# Patient Record
Sex: Male | Born: 1935 | Race: White | Hispanic: No | Smoking: Never smoker
Health system: Southern US, Community
[De-identification: ages and names within clinical notes are randomized; demographics above are authoritative.]

## PROBLEM LIST (undated history)

## (undated) DIAGNOSIS — R112 Nausea with vomiting, unspecified: Secondary | ICD-10-CM

## (undated) DIAGNOSIS — J45909 Unspecified asthma, uncomplicated: Secondary | ICD-10-CM

## (undated) DIAGNOSIS — E119 Type 2 diabetes mellitus without complications: Secondary | ICD-10-CM

## (undated) DIAGNOSIS — I10 Essential (primary) hypertension: Secondary | ICD-10-CM

## (undated) DIAGNOSIS — C801 Malignant (primary) neoplasm, unspecified: Secondary | ICD-10-CM

## (undated) DIAGNOSIS — G4730 Sleep apnea, unspecified: Secondary | ICD-10-CM

## (undated) DIAGNOSIS — K219 Gastro-esophageal reflux disease without esophagitis: Secondary | ICD-10-CM

## (undated) DIAGNOSIS — D649 Anemia, unspecified: Secondary | ICD-10-CM

## (undated) DIAGNOSIS — J988 Other specified respiratory disorders: Secondary | ICD-10-CM

## (undated) DIAGNOSIS — Z9889 Other specified postprocedural states: Secondary | ICD-10-CM

## (undated) DIAGNOSIS — M71122 Other infective bursitis, left elbow: Secondary | ICD-10-CM

## (undated) HISTORY — PX: TOE SURGERY: SHX1073

## (undated) HISTORY — PX: TONSILLECTOMY: SUR1361

## (undated) HISTORY — DX: Type 2 diabetes mellitus without complications: E11.9

## (undated) HISTORY — PX: ELBOW SURGERY: SHX618

## (undated) HISTORY — PX: EYE SURGERY: SHX253

## (undated) HISTORY — DX: Malignant (primary) neoplasm, unspecified: C80.1

## (undated) HISTORY — DX: Other specified respiratory disorders: J98.8

## (undated) HISTORY — DX: Essential (primary) hypertension: I10

## (undated) HISTORY — PX: CATARACT EXTRACTION, BILATERAL: SHX1313

## (undated) HISTORY — DX: Other infective bursitis, left elbow: M71.122

## (undated) HISTORY — DX: Unspecified asthma, uncomplicated: J45.909

---

## 1998-03-09 HISTORY — PX: OTHER SURGICAL HISTORY: SHX169

## 2000-03-09 HISTORY — PX: PROSTATE SURGERY: SHX751

## 2011-10-29 DIAGNOSIS — H269 Unspecified cataract: Secondary | ICD-10-CM | POA: Insufficient documentation

## 2011-10-29 DIAGNOSIS — E1122 Type 2 diabetes mellitus with diabetic chronic kidney disease: Secondary | ICD-10-CM | POA: Insufficient documentation

## 2011-11-25 DIAGNOSIS — G4733 Obstructive sleep apnea (adult) (pediatric): Secondary | ICD-10-CM | POA: Insufficient documentation

## 2011-11-25 DIAGNOSIS — K219 Gastro-esophageal reflux disease without esophagitis: Secondary | ICD-10-CM | POA: Insufficient documentation

## 2011-11-25 DIAGNOSIS — E669 Obesity, unspecified: Secondary | ICD-10-CM | POA: Insufficient documentation

## 2011-11-25 DIAGNOSIS — E785 Hyperlipidemia, unspecified: Secondary | ICD-10-CM | POA: Insufficient documentation

## 2011-11-25 DIAGNOSIS — J45909 Unspecified asthma, uncomplicated: Secondary | ICD-10-CM | POA: Insufficient documentation

## 2011-11-25 DIAGNOSIS — D649 Anemia, unspecified: Secondary | ICD-10-CM | POA: Insufficient documentation

## 2011-11-25 DIAGNOSIS — J309 Allergic rhinitis, unspecified: Secondary | ICD-10-CM | POA: Insufficient documentation

## 2011-11-25 DIAGNOSIS — C61 Malignant neoplasm of prostate: Secondary | ICD-10-CM | POA: Insufficient documentation

## 2011-11-25 DIAGNOSIS — I10 Essential (primary) hypertension: Secondary | ICD-10-CM | POA: Insufficient documentation

## 2011-12-30 DIAGNOSIS — G2581 Restless legs syndrome: Secondary | ICD-10-CM | POA: Insufficient documentation

## 2011-12-31 DIAGNOSIS — Z9889 Other specified postprocedural states: Secondary | ICD-10-CM | POA: Insufficient documentation

## 2014-01-08 ENCOUNTER — Ambulatory Visit (INDEPENDENT_AMBULATORY_CARE_PROVIDER_SITE_OTHER): Payer: Medicare Other | Admitting: General Surgery

## 2014-01-08 ENCOUNTER — Encounter: Payer: Self-pay | Admitting: General Surgery

## 2014-01-08 VITALS — BP 110/80 | HR 78 | Resp 16 | Ht 66.0 in | Wt 240.0 lb

## 2014-01-08 DIAGNOSIS — D170 Benign lipomatous neoplasm of skin and subcutaneous tissue of head, face and neck: Secondary | ICD-10-CM

## 2014-01-08 NOTE — Patient Instructions (Signed)
Follow up appointment to be announced.  

## 2014-01-08 NOTE — Progress Notes (Signed)
Patient ID: Joshua Humphrey, male   DOB: 07-Sep-1929, 80 y.o.   MRN: [id number]  Chief Complaint  Patient presents with  . Other    evaluation of large lipoma on upper back    HPI Joshua Humphrey is a 80 y.o. male. here today for a evalaution of a lipoma on back. Patient states the area has been there for 15 years and had it removed in [facility]. Patient states within an year the area was back. He states the are has gotten bigger over the last couple years.                               HPI  Past Medical History  Diagnosis Date  . Asthma   . Hypertension   . Cancer     prostate  . Cancer     skin   . Diabetes mellitus without complication     Past Surgical History  Procedure Laterality Date  . Tumor removed  2000  . Prostate surgery  2002  . Lipoma removal  2000    back     Family History  Problem Relation Age of Onset  . Cancer Mother     Social History History  Substance Use Topics  . Smoking status: Never Smoker   . Smokeless tobacco: Never Used  . Alcohol Use: 0.0 oz/week    0 Not specified per week    No Known Allergies  Current Outpatient Prescriptions  Medication Sig Dispense Refill  . acetaminophen (TYLENOL) 500 MG tablet Take 500 mg by mouth every 6 (six) hours as needed.    . Ascorbic Acid (VITAMIN C) 1000 MG tablet Take 1,000 mg by mouth daily.    Marland Kitchen aspirin 81 MG tablet Take 81 mg by mouth daily.    . beta carotene w/minerals (OCUVITE) tablet Take 1 tablet by mouth daily.    . Calcium Carbonate-Vitamin D (CALTRATE 600+D PO) Take 2 tablets by mouth daily.    . carvedilol (COREG) 25 MG tablet Take 25 mg by mouth 2 (two) times daily with a meal.    . cetirizine (ZYRTEC) 10 MG chewable tablet Chew 10 mg by mouth daily.    Marland Kitchen ezetimibe-simvastatin (VYTORIN) 10-20 MG per tablet Take 1 tablet by mouth daily.    . ferrous fumarate (HEMOCYTE - 106 MG FE) 325 (106 FE) MG TABS tablet Take 1 tablet by mouth daily.    . fluticasone (VERAMYST) 27.5 MCG/SPRAY nasal  spray Place 2 sprays into the nose daily.    . furosemide (LASIX) 40 MG tablet Take 40 mg by mouth 2 (two) times daily.    Marland Kitchen gabapentin (NEURONTIN) 300 MG capsule Take 300 mg by mouth 3 (three) times daily.    . Garlic 0626 MG CAPS Take 1 capsule by mouth daily.    . Glucosamine-Chondroit-Vit C-Mn (GLUCOSAMINE-CHONDROITIN) CAPS Take 2 capsules by mouth daily.    . hydrALAZINE (APRESOLINE) 50 MG tablet Take 50 mg by mouth 3 (three) times daily.    . insulin detemir (LEVEMIR) 100 UNIT/ML injection Inject 20 Units into the skin at bedtime.    . Ipratropium-Albuterol (COMBIVENT RESPIMAT) 20-100 MCG/ACT AERS respimat Inhale 1 puff into the lungs every 6 (six) hours.    Marland Kitchen leuprolide (LUPRON) 22.5 MG injection Inject 22.5 mg into the muscle every 6 (six) months.    Marland Kitchen losartan (COZAAR) 100 MG tablet Take 100 mg by mouth daily.    Marland Kitchen  metFORMIN (GLUCOPHAGE) 850 MG tablet Take 850 mg by mouth 2 (two) times daily with a meal.    . mometasone-formoterol (DULERA) 100-5 MCG/ACT AERO Inhale 2 puffs into the lungs 2 (two) times daily.    . montelukast (SINGULAIR) 10 MG tablet Take 10 mg by mouth at bedtime.    . Multiple Vitamin (MULTIVITAMIN) tablet Take 1 tablet by mouth daily.    Marland Kitchen NIFEdipine (ADALAT CC) 90 MG 24 hr tablet Take 90 mg by mouth daily.    Marland Kitchen omeprazole (PRILOSEC) 20 MG capsule Take 20 mg by mouth daily.    . potassium chloride SA (K-DUR,KLOR-CON) 20 MEQ tablet Take 20 mEq by mouth 2 (two) times daily.    . pramipexole (MIRAPEX) 0.25 MG tablet Take 0.25 mg by mouth at bedtime as needed.    . sitaGLIPtin (JANUVIA) 100 MG tablet Take 100 mg by mouth daily.    Marland Kitchen tretinoin (RETIN-A) 0.1 % cream Apply 1 application topically at bedtime.    Marland Kitchen VITAMIN D, CHOLECALCIFEROL, PO Take 1 capsule by mouth daily.     No current facility-administered medications for this visit.    Review of Systems Review of Systems  Constitutional: Negative.   Respiratory: Negative.     Blood pressure 110/80, pulse 78,  resp. rate 16, height 5\' 6"  (1.676 m), weight 240 lb (108.863 kg).  Physical Exam Physical Exam  Constitutional: He is oriented to person, place, and time. He appears well-developed and well-nourished.  Neck:    Cardiovascular: Normal rate, regular rhythm and normal heart sounds.   Pulmonary/Chest: Effort normal and breath sounds normal.  Neurological: He is alert and oriented to person, place, and time.  Skin: Skin is warm and dry.  12 by 16 mass upper back    Data Reviewed None.  Assessment    Recurrent lipoma.     Plan    Discussed lipoma removal as an outpatient with sedation based on the size of the lesion and modest fixation to the deep fascia.  This can be completed with the patient in the left lateral decubitus position.    PCP:  Reggie Pile 01/09/2014, 9:38 PM

## 2014-01-09 DIAGNOSIS — D170 Benign lipomatous neoplasm of skin and subcutaneous tissue of head, face and neck: Secondary | ICD-10-CM | POA: Insufficient documentation

## 2015-07-04 ENCOUNTER — Other Ambulatory Visit: Payer: Self-pay | Admitting: Neurological Surgery

## 2015-07-04 DIAGNOSIS — M48061 Spinal stenosis, lumbar region without neurogenic claudication: Secondary | ICD-10-CM

## 2015-07-11 ENCOUNTER — Other Ambulatory Visit: Payer: Self-pay | Admitting: Neurological Surgery

## 2015-07-24 ENCOUNTER — Ambulatory Visit: Payer: Medicare Other

## 2015-07-30 ENCOUNTER — Ambulatory Visit
Admission: RE | Admit: 2015-07-30 | Discharge: 2015-07-30 | Disposition: A | Discharge status: Home / Self Care | Payer: Medicare Other | Source: Ambulatory Visit | Attending: Neurological Surgery | Admitting: Neurological Surgery

## 2015-07-30 DIAGNOSIS — M4806 Spinal stenosis, lumbar region: Secondary | ICD-10-CM | POA: Insufficient documentation

## 2015-07-30 DIAGNOSIS — M48061 Spinal stenosis, lumbar region without neurogenic claudication: Secondary | ICD-10-CM

## 2015-08-06 ENCOUNTER — Inpatient Hospital Stay: Admit: 2015-08-06 | Payer: Medicare Other | Admitting: Neurological Surgery

## 2015-08-06 SURGERY — POSTERIOR LUMBAR FUSION 1 LEVEL
Anesthesia: General | Site: Back

## 2015-10-22 DIAGNOSIS — M4807 Spinal stenosis, lumbosacral region: Secondary | ICD-10-CM | POA: Insufficient documentation

## 2015-10-22 DIAGNOSIS — M4120 Other idiopathic scoliosis, site unspecified: Secondary | ICD-10-CM | POA: Insufficient documentation

## 2015-11-07 DIAGNOSIS — M5416 Radiculopathy, lumbar region: Secondary | ICD-10-CM | POA: Diagnosis not present

## 2015-11-07 DIAGNOSIS — Z8546 Personal history of malignant neoplasm of prostate: Secondary | ICD-10-CM

## 2015-11-07 DIAGNOSIS — E1142 Type 2 diabetes mellitus with diabetic polyneuropathy: Secondary | ICD-10-CM

## 2015-11-07 DIAGNOSIS — J45909 Unspecified asthma, uncomplicated: Secondary | ICD-10-CM

## 2015-11-07 DIAGNOSIS — I10 Essential (primary) hypertension: Secondary | ICD-10-CM | POA: Diagnosis not present

## 2015-12-12 ENCOUNTER — Other Ambulatory Visit
Admission: RE | Admit: 2015-12-12 | Discharge: 2015-12-12 | Disposition: A | Discharge status: Home / Self Care | Payer: Medicare Other | Source: Ambulatory Visit | Attending: Family Medicine | Admitting: Family Medicine

## 2015-12-12 DIAGNOSIS — M71122 Other infective bursitis, left elbow: Secondary | ICD-10-CM | POA: Insufficient documentation

## 2015-12-12 LAB — SYNOVIAL CELL COUNT + DIFF, W/ CRYSTALS
CRYSTALS FLUID: NONE SEEN
EOSINOPHILS-SYNOVIAL: 0 %
LYMPHOCYTES-SYNOVIAL FLD: 7 %
MONOCYTE-MACROPHAGE-SYNOVIAL FLUID: 0 %
Neutrophil, Synovial: 93 %
Other Cells-SYN: 0
WBC, Synovial: 55341 /mm3 — ABNORMAL HIGH (ref 0–200)

## 2015-12-15 LAB — BODY FLUID CULTURE

## 2015-12-20 LAB — ANAEROBIC CULTURE

## 2016-01-09 ENCOUNTER — Encounter: Payer: Self-pay | Admitting: Urology

## 2016-01-09 ENCOUNTER — Ambulatory Visit (INDEPENDENT_AMBULATORY_CARE_PROVIDER_SITE_OTHER): Payer: Medicare Other | Admitting: Urology

## 2016-01-09 VITALS — BP 109/62 | HR 71 | Ht 69.0 in | Wt 213.8 lb

## 2016-01-09 DIAGNOSIS — Z8546 Personal history of malignant neoplasm of prostate: Secondary | ICD-10-CM

## 2016-01-09 NOTE — Progress Notes (Signed)
01/09/2016 3:57 PM   Joshua Humphrey [birth year] [id number]  Referring provider: Montey Hora, MD No address on file  Chief Complaint  Patient presents with  . New Patient (Initial Visit)    Prostate Cancer     HPI: The patient is an 82 year old gentleman with a past medical history of prostate cancer status post prostatectomy in 2001 who presents for further evaluation. His PSA was undetectable in October 2017. The patient is transferring his care from a urologist in [facility]. I do not have records at this time from the office. Per the patient he was getting Lupron 22.5 mg IM every 6 months. He does not think that he had metastatic prostate cancer. He has been on Lupron since his prostate was removed per the patient. It is not clear why he was on Lupron or if he had recurrence of disease.  The patient does note mild incontinence that does not bother him. He otherwise has no urinary complaints.   PMH: Past Medical History:  Diagnosis Date  . Asthma   . Cancer    prostate  . Cancer    skin   . Diabetes mellitus without complication   . Hypertension     Surgical History: Past Surgical History:  Procedure Laterality Date  . lipoma removal  2000   back   . PROSTATE SURGERY  2002  . tumor removed  2000    Home Medications:    Medication List       Accurate as of 01/09/16  3:57 PM. Always use your most recent med list.          acetaminophen 500 MG tablet Commonly known as:  TYLENOL Take 500 mg by mouth every 6 (six) hours as needed.   aspirin 81 MG tablet Take 81 mg by mouth daily.   beta carotene w/minerals tablet Take 1 tablet by mouth daily.   CALTRATE 600+D PO Take 2 tablets by mouth daily.   carvedilol 25 MG tablet Commonly known as:  COREG Take 25 mg by mouth 2 (two) times daily with a meal.   cetirizine 10 MG chewable tablet Commonly known as:  ZYRTEC Chew 10 mg by mouth daily.   COMBIVENT RESPIMAT 20-100 MCG/ACT Aers  respimat Generic drug:  Ipratropium-Albuterol Inhale 1 puff into the lungs every 6 (six) hours.   ezetimibe-simvastatin 10-20 MG tablet Commonly known as:  VYTORIN Take 1 tablet by mouth daily.   ferrous fumarate 325 (106 Fe) MG Tabs tablet Commonly known as:  HEMOCYTE - 106 mg FE Take 1 tablet by mouth daily.   fluticasone 27.5 MCG/SPRAY nasal spray Commonly known as:  VERAMYST Place 2 sprays into the nose daily.   furosemide 40 MG tablet Commonly known as:  LASIX Take 40 mg by mouth 2 (two) times daily.   gabapentin 300 MG capsule Commonly known as:  NEURONTIN Take 300 mg by mouth 3 (three) times daily.   Garlic [id number] MG Caps Take 1 capsule by mouth daily.   Glucosamine-Chondroitin Caps Take 2 capsules by mouth daily.   hydrALAZINE 50 MG tablet Commonly known as:  APRESOLINE Take 50 mg by mouth 3 (three) times daily.   insulin detemir 100 UNIT/ML injection Commonly known as:  LEVEMIR Inject 20 Units into the skin at bedtime.   leuprolide 22.5 MG injection Commonly known as:  LUPRON Inject 22.5 mg into the muscle every 6 (six) months.   losartan 100 MG tablet Commonly known as:  COZAAR Take 100 mg by mouth  daily.   metFORMIN 850 MG tablet Commonly known as:  GLUCOPHAGE Take 850 mg by mouth 2 (two) times daily with a meal.   mometasone-formoterol 100-5 MCG/ACT Aero Commonly known as:  DULERA Inhale 2 puffs into the lungs 2 (two) times daily.   montelukast 10 MG tablet Commonly known as:  SINGULAIR Take 10 mg by mouth at bedtime.   multivitamin tablet Take 1 tablet by mouth daily.   NIFEdipine 90 MG 24 hr tablet Commonly known as:  ADALAT CC Take 90 mg by mouth daily.   omeprazole 20 MG capsule Commonly known as:  PRILOSEC Take 20 mg by mouth daily.   potassium chloride SA 20 MEQ tablet Commonly known as:  K-DUR,KLOR-CON Take 20 mEq by mouth 2 (two) times daily.   pramipexole 0.25 MG tablet Commonly known as:  MIRAPEX Take 0.25 mg by mouth at  bedtime as needed.   sitaGLIPtin 100 MG tablet Commonly known as:  JANUVIA Take 100 mg by mouth daily.   tretinoin 0.1 % cream Commonly known as:  RETIN-A Apply 1 application topically at bedtime.   vitamin C 1000 MG tablet Take 1,000 mg by mouth daily.   VITAMIN D (CHOLECALCIFEROL) PO Take 1 capsule by mouth daily.       Allergies: No Known Allergies  Family History: Family History  Problem Relation Age of Onset  . Cancer Mother     Social History:  reports that he has never smoked. He has never used smokeless tobacco. He reports that he drinks alcohol. He reports that he does not use drugs.  ROS: UROLOGY Frequent Urination?: Yes Hard to postpone urination?: Yes Burning/pain with urination?: No Get up at night to urinate?: Yes Leakage of urine?: Yes Urine stream starts and stops?: No Trouble starting stream?: No Do you have to strain to urinate?: No Blood in urine?: No Urinary tract infection?: No Sexually transmitted disease?: No Injury to kidneys or bladder?: No Painful intercourse?: No Weak stream?: No Erection problems?: Yes Penile pain?: No  Gastrointestinal Nausea?: No Vomiting?: No Indigestion/heartburn?: No Diarrhea?: No Constipation?: No  Constitutional Fever: No Night sweats?: No Weight loss?: No Fatigue?: No  Skin Skin rash/lesions?: No Itching?: No  Eyes Blurred vision?: No Double vision?: No  Ears/Nose/Throat Sore throat?: No Sinus problems?: No  Hematologic/Lymphatic Swollen glands?: No Easy bruising?: No  Cardiovascular Leg swelling?: Yes Chest pain?: No  Respiratory Cough?: No Shortness of breath?: No  Endocrine Excessive thirst?: No  Musculoskeletal Back pain?: Yes Joint pain?: Yes  Neurological Headaches?: No Dizziness?: No  Psychologic Depression?: No Anxiety?: No  Physical Exam: There were no vitals taken for this visit.  Constitutional:  Alert and oriented, No acute distress. HEENT: [place] AT,  moist mucus membranes.  Trachea midline, no masses. Cardiovascular: No clubbing, cyanosis, or edema. Respiratory: Normal respiratory effort, no increased work of breathing. GI: Abdomen is soft, nontender, nondistended, no abdominal masses GU: No CVA tenderness.  Skin: No rashes, bruises or suspicious lesions. Lymph: No cervical or inguinal adenopathy. Neurologic: Grossly intact, no focal deficits, moving all 4 extremities. Psychiatric: Normal mood and affect.  Laboratory Data: No results found for: WBC, HGB, HCT, MCV, PLT  No results found for: CREATININE  No results found for: PSA  No results found for: TESTOSTERONE  No results found for: HGBA1C  Urinalysis No results found for: COLORURINE, APPEARANCEUR, LABSPEC, PHURINE, GLUCOSEU, HGBUR, BILIRUBINUR, KETONESUR, PROTEINUR, UROBILINOGEN, NITRITE, LEUKOCYTESUR   Assessment & Plan:   1. History of prostate cancer As I do not have  the patient's records from his previous urologist, it is difficult to assess what the status of his disease was or why he was on Lupron. Per the patient's personal medication list, he was receiving a Lupron dose every 6 months that only lasted 3 months. This is not typical, and I'm concerned by the accuracy. I'm also concerned that I do not have access at this time to these previous records. Fortunately, his PSA is currently undetectable. We will have him return in 3 months with a PSA prior. In the interim we will try to obtain his records from his previous urologists, so we know his complete previous urological history.  Nickie Retort, MD  [facility] [address] [place], [place] [address] [phone]

## 2016-01-16 ENCOUNTER — Inpatient Hospital Stay: Admission: RE | Admit: 2016-01-16 | Payer: Medicare Other | Source: Ambulatory Visit

## 2016-01-20 ENCOUNTER — Encounter
Admission: RE | Admit: 2016-01-20 | Discharge: 2016-01-20 | Disposition: A | Discharge status: Home / Self Care | Payer: Medicare Other | Source: Ambulatory Visit | Attending: Surgery | Admitting: Surgery

## 2016-01-20 DIAGNOSIS — E119 Type 2 diabetes mellitus without complications: Secondary | ICD-10-CM | POA: Diagnosis not present

## 2016-01-20 DIAGNOSIS — M7022 Olecranon bursitis, left elbow: Secondary | ICD-10-CM | POA: Diagnosis not present

## 2016-01-20 DIAGNOSIS — J449 Chronic obstructive pulmonary disease, unspecified: Secondary | ICD-10-CM | POA: Diagnosis not present

## 2016-01-20 DIAGNOSIS — I10 Essential (primary) hypertension: Secondary | ICD-10-CM | POA: Diagnosis not present

## 2016-01-20 DIAGNOSIS — G4730 Sleep apnea, unspecified: Secondary | ICD-10-CM | POA: Diagnosis not present

## 2016-01-20 DIAGNOSIS — K219 Gastro-esophageal reflux disease without esophagitis: Secondary | ICD-10-CM | POA: Diagnosis not present

## 2016-01-20 DIAGNOSIS — Z794 Long term (current) use of insulin: Secondary | ICD-10-CM | POA: Diagnosis not present

## 2016-01-20 DIAGNOSIS — Z7982 Long term (current) use of aspirin: Secondary | ICD-10-CM | POA: Diagnosis not present

## 2016-01-20 HISTORY — DX: Gastro-esophageal reflux disease without esophagitis: K21.9

## 2016-01-20 HISTORY — DX: Anemia, unspecified: D64.9

## 2016-01-20 HISTORY — DX: Sleep apnea, unspecified: G47.30

## 2016-01-20 HISTORY — DX: Other specified postprocedural states: Z98.890

## 2016-01-20 HISTORY — DX: Nausea with vomiting, unspecified: R11.2

## 2016-01-20 LAB — BASIC METABOLIC PANEL
ANION GAP: 8 (ref 5–15)
BUN: 27 mg/dL — ABNORMAL HIGH (ref 6–20)
CHLORIDE: 106 mmol/L (ref 101–111)
CO2: 27 mmol/L (ref 22–32)
CREATININE: 0.85 mg/dL (ref 0.61–1.24)
Calcium: 9.3 mg/dL (ref 8.9–10.3)
GFR calc non Af Amer: >60 mL/min (ref >60)
Glucose, Bld: 102 mg/dL — ABNORMAL HIGH (ref 65–99)
Potassium: 4.1 mmol/L (ref 3.5–5.1)
Sodium: 141 mmol/L (ref 135–145)

## 2016-01-20 LAB — CBC
HEMATOCRIT: 36.3 % — AB (ref 40.0–52.0)
HEMOGLOBIN: 12.1 g/dL — AB (ref 13.0–18.0)
MCH: 31.4 pg (ref 26.0–34.0)
MCHC: 33.3 g/dL (ref 32.0–36.0)
MCV: 94.2 fL (ref 80.0–100.0)
Platelets: 275 10*3/uL (ref 150–440)
RBC: 3.85 MIL/uL — ABNORMAL LOW (ref 4.40–5.90)
RDW: 15.1 % — ABNORMAL HIGH (ref 11.5–14.5)
WBC: 7.7 10*3/uL (ref 3.8–10.6)

## 2016-01-20 NOTE — Patient Instructions (Signed)
Your procedure is scheduled on: Tuesday 01/21/16 Report to Day Surgery. 2ND FLOOR MEDICAL MALL ENTRANCE To find out your arrival time please call [phone] between 1PM - 3PM on Monday 01/20/16.  Remember: Instructions that are not followed completely may result in serious medical risk, up to and including death, or upon the discretion of your surgeon and anesthesiologist your surgery may need to be rescheduled.    __X__ 1. Do not eat food or drink liquids after midnight. No gum chewing or hard candies.     __X__ 2. No Alcohol for 24 hours before or after surgery.   ____ 3. Bring all medications with you on the day of surgery if instructed.    __X__ 4. Notify your doctor if there is any change in your medical condition     (cold, fever, infections).     Do not wear jewelry, make-up, hairpins, clips or nail polish.  Do not wear lotions, powders, or perfumes.   Do not shave 48 hours prior to surgery. Men may shave face and neck.  Do not bring valuables to the hospital.    [facility] is not responsible for any belongings or valuables.               Contacts, dentures or bridgework may not be worn into surgery.  Leave your suitcase in the car. After surgery it may be brought to your room.  For patients admitted to the hospital, discharge time is determined by your                treatment team.   Patients discharged the day of surgery will not be allowed to drive home.   Please read over the following fact sheets that you were given:   Pain Booklet   __X__ Take these medicines the morning of surgery with A SIP OF WATER:    1. CARVEDILOL  2. GABAPENTIN  3. HYDRALAZINE    HOLD ASPIRIN, METFORMIN, GLUCOSAMINE, MELOXICAM  4. Minco  6. OMEPRAZOLE  ____ Fleet Enema (as directed)   __X__ Use CHG Soap as directed  __X__ Use inhalers on the day of surgery  __X__ Stop metformin 2 days prior to surgery    __X__ Take 1/2 of usual insulin dose the night before  surgery and none on the morning of surgery.   __X__ Stop Coumadin/Plavix/aspirin   __X__ Stop Anti-inflammatories on NO MELOXICAM, GLUCOSAMINE   __X__ Stop supplements until after surgery.    __X__ Bring C-Pap to the hospital.

## 2016-01-21 ENCOUNTER — Ambulatory Visit
Admission: RE | Admit: 2016-01-21 | Discharge: 2016-01-21 | Disposition: A | Discharge status: Home / Self Care | Payer: Medicare Other | Source: Ambulatory Visit | Attending: Surgery | Admitting: Surgery

## 2016-01-21 ENCOUNTER — Ambulatory Visit: Payer: Medicare Other | Admitting: Anesthesiology

## 2016-01-21 ENCOUNTER — Encounter
Admission: RE | Disposition: A | Discharge status: Home / Self Care | Payer: Self-pay | Source: Ambulatory Visit | Attending: Surgery

## 2016-01-21 DIAGNOSIS — K219 Gastro-esophageal reflux disease without esophagitis: Secondary | ICD-10-CM | POA: Insufficient documentation

## 2016-01-21 DIAGNOSIS — M7022 Olecranon bursitis, left elbow: Secondary | ICD-10-CM | POA: Insufficient documentation

## 2016-01-21 DIAGNOSIS — E119 Type 2 diabetes mellitus without complications: Secondary | ICD-10-CM | POA: Insufficient documentation

## 2016-01-21 DIAGNOSIS — G4730 Sleep apnea, unspecified: Secondary | ICD-10-CM | POA: Insufficient documentation

## 2016-01-21 DIAGNOSIS — J449 Chronic obstructive pulmonary disease, unspecified: Secondary | ICD-10-CM | POA: Insufficient documentation

## 2016-01-21 DIAGNOSIS — Z7982 Long term (current) use of aspirin: Secondary | ICD-10-CM | POA: Insufficient documentation

## 2016-01-21 DIAGNOSIS — I10 Essential (primary) hypertension: Secondary | ICD-10-CM | POA: Insufficient documentation

## 2016-01-21 DIAGNOSIS — Z794 Long term (current) use of insulin: Secondary | ICD-10-CM | POA: Insufficient documentation

## 2016-01-21 HISTORY — PX: OLECRANON BURSECTOMY: SHX2097

## 2016-01-21 LAB — GLUCOSE, CAPILLARY
Glucose-Capillary: 128 mg/dL — ABNORMAL HIGH (ref 65–99)
Glucose-Capillary: 125 mg/dL — ABNORMAL HIGH (ref 65–99)

## 2016-01-21 SURGERY — BURSECTOMY, ELBOW
Anesthesia: General | Site: Elbow | Laterality: Left | Wound class: Dirty or Infected

## 2016-01-21 MED ORDER — METOCLOPRAMIDE HCL 10 MG PO TABS: 5.0000 mg | ORAL_TABLET | Freq: Three times a day (TID) | ORAL | Status: DC | PRN

## 2016-01-21 MED ORDER — CEFAZOLIN SODIUM-DEXTROSE 2-4 GM/100ML-% IV SOLN
2.0000 g | Freq: Once | INTRAVENOUS | Status: AC
  Administered 2016-01-21: 2 g via INTRAVENOUS

## 2016-01-21 MED ORDER — NEOMYCIN-POLYMYXIN B GU 40-200000 IR SOLN
Status: AC
  Filled 2016-01-21: qty 2

## 2016-01-21 MED ORDER — SODIUM CHLORIDE 0.9 % IV SOLN
INTRAVENOUS | Status: DC
  Administered 2016-01-21 (×2): via INTRAVENOUS

## 2016-01-21 MED ORDER — CEFAZOLIN SODIUM-DEXTROSE 2-4 GM/100ML-% IV SOLN
INTRAVENOUS | Status: AC
  Filled 2016-01-21: qty 100

## 2016-01-21 MED ORDER — PHENYLEPHRINE HCL 10 MG/ML IJ SOLN
INTRAMUSCULAR | Status: DC | PRN
  Administered 2016-01-21 (×8): 100 ug via INTRAVENOUS

## 2016-01-21 MED ORDER — DEXAMETHASONE SODIUM PHOSPHATE 10 MG/ML IJ SOLN
INTRAMUSCULAR | Status: DC | PRN
  Administered 2016-01-21: 5 mg via INTRAVENOUS

## 2016-01-21 MED ORDER — FENTANYL CITRATE (PF) 100 MCG/2ML IJ SOLN
25.0000 ug | INTRAMUSCULAR | Status: DC | PRN
  Administered 2016-01-21 (×4): 25 ug via INTRAVENOUS

## 2016-01-21 MED ORDER — BUPIVACAINE HCL (PF) 0.5 % IJ SOLN
INTRAMUSCULAR | Status: AC
  Filled 2016-01-21: qty 30

## 2016-01-21 MED ORDER — BUPIVACAINE HCL (PF) 0.5 % IJ SOLN
INTRAMUSCULAR | Status: DC | PRN
  Administered 2016-01-21: 10 mL

## 2016-01-21 MED ORDER — PROPOFOL 10 MG/ML IV BOLUS
INTRAVENOUS | Status: DC | PRN
  Administered 2016-01-21: 60 mg via INTRAVENOUS
  Administered 2016-01-21: 100 mg via INTRAVENOUS

## 2016-01-21 MED ORDER — POTASSIUM CHLORIDE IN NACL 20-0.9 MEQ/L-% IV SOLN
INTRAVENOUS | Status: DC
  Filled 2016-01-21: qty 1000

## 2016-01-21 MED ORDER — GLYCOPYRROLATE 0.2 MG/ML IJ SOLN
INTRAMUSCULAR | Status: DC | PRN
  Administered 2016-01-21: 0.2 mg via INTRAVENOUS

## 2016-01-21 MED ORDER — FENTANYL CITRATE (PF) 100 MCG/2ML IJ SOLN
INTRAMUSCULAR | Status: DC | PRN
  Administered 2016-01-21: 50 ug via INTRAVENOUS

## 2016-01-21 MED ORDER — HYDROCODONE-ACETAMINOPHEN 5-325 MG PO TABS
ORAL_TABLET | ORAL | Status: AC
  Filled 2016-01-21: qty 1

## 2016-01-21 MED ORDER — FENTANYL CITRATE (PF) 100 MCG/2ML IJ SOLN
INTRAMUSCULAR | Status: AC
  Filled 2016-01-21: qty 2

## 2016-01-21 MED ORDER — METOCLOPRAMIDE HCL 5 MG/ML IJ SOLN: 5.0000 mg | Freq: Three times a day (TID) | INTRAMUSCULAR | Status: DC | PRN

## 2016-01-21 MED ORDER — HYDROCODONE-ACETAMINOPHEN 5-325 MG PO TABS
1.0000 | ORAL_TABLET | ORAL | Status: DC | PRN
  Administered 2016-01-21: 1 via ORAL

## 2016-01-21 MED ORDER — NEOMYCIN-POLYMYXIN B GU 40-200000 IR SOLN
Status: DC | PRN
  Administered 2016-01-21: 2 mL

## 2016-01-21 MED ORDER — ONDANSETRON HCL 4 MG/2ML IJ SOLN: 4.0000 mg | Freq: Four times a day (QID) | INTRAMUSCULAR | Status: DC | PRN

## 2016-01-21 MED ORDER — HYDROCODONE-ACETAMINOPHEN 5-325 MG PO TABS: 1.0000 | ORAL_TABLET | Freq: Four times a day (QID) | ORAL | 0 refills | Status: DC | PRN

## 2016-01-21 MED ORDER — OXYCODONE HCL 5 MG/5ML PO SOLN: 5.0000 mg | Freq: Once | ORAL | Status: DC | PRN

## 2016-01-21 MED ORDER — ONDANSETRON HCL 4 MG/2ML IJ SOLN
INTRAMUSCULAR | Status: DC | PRN
  Administered 2016-01-21: 4 mg via INTRAVENOUS

## 2016-01-21 MED ORDER — ONDANSETRON HCL 4 MG PO TABS: 4.0000 mg | ORAL_TABLET | Freq: Four times a day (QID) | ORAL | Status: DC | PRN

## 2016-01-21 MED ORDER — OXYCODONE HCL 5 MG PO TABS: 5.0000 mg | ORAL_TABLET | Freq: Once | ORAL | Status: DC | PRN

## 2016-01-21 MED ORDER — CEPHALEXIN 500 MG PO CAPS: 500.0000 mg | ORAL_CAPSULE | Freq: Four times a day (QID) | ORAL | 0 refills | Status: DC

## 2016-01-21 MED ORDER — LIDOCAINE HCL (CARDIAC) 20 MG/ML IV SOLN
INTRAVENOUS | Status: DC | PRN
  Administered 2016-01-21: 100 mg via INTRAVENOUS

## 2016-01-21 SURGICAL SUPPLY — 42 items
BANDAGE ELASTIC 4 LF NS (GAUZE/BANDAGES/DRESSINGS) ×3 IMPLANT
BNDG COHESIVE 4X5 TAN STRL (GAUZE/BANDAGES/DRESSINGS) ×3 IMPLANT
BNDG ESMARK 4X12 TAN STRL LF (GAUZE/BANDAGES/DRESSINGS) ×3 IMPLANT
CANISTER SUCT 1200ML W/VALVE (MISCELLANEOUS) ×3 IMPLANT
CHLORAPREP W/TINT 26ML (MISCELLANEOUS) ×3 IMPLANT
CLOSURE WOUND 1/2 X4 (GAUZE/BANDAGES/DRESSINGS)
CUFF TOURN 18 STER (MISCELLANEOUS) ×3 IMPLANT
CUFF TOURN 24 STER (MISCELLANEOUS) IMPLANT
DRAPE SHEET LG 3/4 BI-LAMINATE (DRAPES) ×3 IMPLANT
ELECT CAUTERY BLADE 6.4 (BLADE) ×3 IMPLANT
ELECT REM PT RETURN 9FT ADLT (ELECTROSURGICAL) ×3
ELECTRODE REM PT RTRN 9FT ADLT (ELECTROSURGICAL) ×1 IMPLANT
GAUZE FLUFF 18X24 1PLY STRL (GAUZE/BANDAGES/DRESSINGS) IMPLANT
GAUZE PETRO XEROFOAM 1X8 (MISCELLANEOUS) ×3 IMPLANT
GAUZE SPONGE 4X4 12PLY STRL (GAUZE/BANDAGES/DRESSINGS) ×3 IMPLANT
GLOVE BIO SURGEON STRL SZ8 (GLOVE) ×6 IMPLANT
GLOVE BIOGEL M 7.0 STRL (GLOVE) ×6 IMPLANT
GLOVE BIOGEL PI IND STRL 7.5 (GLOVE) ×1 IMPLANT
GLOVE BIOGEL PI INDICATOR 7.5 (GLOVE) ×2
GLOVE INDICATOR 8.0 STRL GRN (GLOVE) ×6 IMPLANT
GOWN STRL REUS W/ TWL LRG LVL3 (GOWN DISPOSABLE) ×1 IMPLANT
GOWN STRL REUS W/ TWL XL LVL3 (GOWN DISPOSABLE) ×1 IMPLANT
GOWN STRL REUS W/TWL LRG LVL3 (GOWN DISPOSABLE) ×2
GOWN STRL REUS W/TWL XL LVL3 (GOWN DISPOSABLE) ×2
KIT RM TURNOVER STRD PROC AR (KITS) ×3 IMPLANT
NEEDLE FILTER BLUNT 18X 1/2SAF (NEEDLE) ×2
NEEDLE FILTER BLUNT 18X1 1/2 (NEEDLE) ×1 IMPLANT
NS IRRIG 500ML POUR BTL (IV SOLUTION) ×3 IMPLANT
PACK EXTREMITY ARMC (MISCELLANEOUS) ×3 IMPLANT
PAD ABD DERMACEA PRESS 5X9 (GAUZE/BANDAGES/DRESSINGS) IMPLANT
PAD CAST CTTN 4X4 STRL (SOFTGOODS) IMPLANT
PADDING CAST COTTON 4X4 STRL (SOFTGOODS)
SLING ARM LRG DEEP (SOFTGOODS) ×3 IMPLANT
SPONGE LAP 18X18 5 PK (GAUZE/BANDAGES/DRESSINGS) ×3 IMPLANT
STOCKINETTE IMPERVIOUS 9X36 MD (GAUZE/BANDAGES/DRESSINGS) ×3 IMPLANT
STRIP CLOSURE SKIN 1/2X4 (GAUZE/BANDAGES/DRESSINGS) IMPLANT
SUT ETHIBOND 0 MO6 C/R (SUTURE) ×3 IMPLANT
SUT VIC AB 2-0 SH 27 (SUTURE) ×2
SUT VIC AB 2-0 SH 27XBRD (SUTURE) ×1 IMPLANT
SUT VIC AB 3-0 PS2 18 (SUTURE) ×3 IMPLANT
SWAB CULTURE AMIES ANAERIB BLU (MISCELLANEOUS) ×3 IMPLANT
SYR 3ML LL SCALE MARK (SYRINGE) ×3 IMPLANT

## 2016-01-21 NOTE — Transfer of Care (Signed)
Immediate Anesthesia Transfer of Care Note  Patient: Joshua Humphrey  Procedure(s) Performed: Procedure(s): OLECRANON BURSA (Left)  Patient Location: PACU  Anesthesia Type:General  Level of Consciousness: awake and sedated  Airway & Oxygen Therapy: Patient Spontanous Breathing and Patient connected to face mask oxygen  Post-op Assessment: Report given to RN and Post -op Vital signs reviewed and stable  Post vital signs: Reviewed and stable  Last Vitals:  Vitals:   01/21/16 1215  BP: (!) 191/91  Pulse: 95  Resp: 18  Temp: 36.7 C    Last Pain:  Vitals:   01/21/16 1215  TempSrc: Tympanic  PainSc:          Complications: No apparent anesthesia complications

## 2016-01-21 NOTE — Anesthesia Postprocedure Evaluation (Signed)
Anesthesia Post Note  Patient: Joshua Humphrey  Procedure(s) Performed: Procedure(s) (LRB): OLECRANON BURSA (Left)  Patient location during evaluation: PACU Anesthesia Type: General Level of consciousness: awake and alert Pain management: pain level controlled Vital Signs Assessment: post-procedure vital signs reviewed and stable Respiratory status: spontaneous breathing, nonlabored ventilation, respiratory function stable and patient connected to nasal cannula oxygen Cardiovascular status: blood pressure returned to baseline and stable Postop Assessment: no signs of nausea or vomiting Anesthetic complications: no    Last Vitals:  Vitals:   01/21/16 1523 01/21/16 1534  BP: 140/73 (!) 162/77  Pulse: 70 67  Resp: 15 18  Temp:  36.6 C    Last Pain:  Vitals:   01/21/16 1534  TempSrc:   PainSc: 4                  Joseph K Piscitello

## 2016-01-21 NOTE — Discharge Instructions (Addendum)
Keep dressing dry and intact. Keep hand elevated above heart level. May shower after dressing removed on postop day 4 (Saturday). Cover sutures with Band-Aids after drying off. Apply ice to affected area frequently. Resume Mobic daily. Take pain medication as prescribed when needed.  Return for follow-up in 10-14 days or as scheduled.   AMBULATORY SURGERY  DISCHARGE INSTRUCTIONS   1) The drugs that you were given will stay in your system until tomorrow so for the next 24 hours you should not:  A) Drive an automobile B) Make any legal decisions C) Drink any alcoholic beverage   2) You may resume regular meals tomorrow.  Today it is better to start with liquids and gradually work up to solid foods.  You may eat anything you prefer, but it is better to start with liquids, then soup and crackers, and gradually work up to solid foods.   3) Please notify your doctor immediately if you have any unusual bleeding, trouble breathing, redness and pain at the surgery site, drainage, fever, or pain not relieved by medication.    4) Additional Instructions:        Please contact your physician with any problems or Same Day Surgery at [phone], Monday through Friday 6 am to 4 pm, or [place] at [facility] number at [phone].

## 2016-01-21 NOTE — H&P (Signed)
Paper H&P to be scanned into permanent record. H&P reviewed. No changes. 

## 2016-01-21 NOTE — Anesthesia Preprocedure Evaluation (Signed)
Anesthesia Evaluation  Patient identified by MRN, date of birth, ID band Patient awake    Reviewed: Allergy & Precautions, H&P , NPO status , Patient's Chart, lab work & pertinent test results  History of Anesthesia Complications (+) PONV, DIFFICULT IV STICK / SPECIAL LINE and history of anesthetic complications  Airway Mallampati: III  TM Distance: <3 FB Neck ROM: limited    Dental  (+) Poor Dentition, Chipped   Pulmonary asthma , sleep apnea , COPD,    Pulmonary exam normal breath sounds clear to auscultation       Cardiovascular Exercise Tolerance: Good hypertension, (-) angina(-) Past MI and (-) DOE Normal cardiovascular exam Rhythm:regular Rate:Normal     Neuro/Psych negative neurological ROS  negative psych ROS   GI/Hepatic Neg liver ROS, GERD  Controlled,  Endo/Other  diabetes, Type 2, Insulin Dependent  Renal/GU      Musculoskeletal   Abdominal   Peds  Hematology negative hematology ROS (+)   Anesthesia Other Findings Past Medical History: No date: Anemia No date: Asthma No date: Cancer [facility])     Comment: prostate No date: Cancer ([place])     Comment: skin  No date: Diabetes mellitus without complication (HCC) No date: GERD (gastroesophageal reflux disease) No date: Hypertension No date: PONV (postoperative nausea and vomiting) No date: Sleep apnea  Past Surgical History: No date: CATARACT EXTRACTION, BILATERAL 2000: lipoma removal     Comment: back  2002: PROSTATE SURGERY No date: TOE SURGERY No date: TONSILLECTOMY 2000: tumor removed  BMI    Body Mass Index:  31.45 kg/m      Reproductive/Obstetrics negative OB ROS                             Anesthesia Physical Anesthesia Plan  ASA: III  Anesthesia Plan: General LMA   Post-op Pain Management:    Induction:   Airway Management Planned:   Additional Equipment:   Intra-op Plan:   Post-operative Plan:    Informed Consent: I have reviewed the patients History and Physical, chart, labs and discussed the procedure including the risks, benefits and alternatives for the proposed anesthesia with the patient or authorized representative who has indicated his/her understanding and acceptance.   Dental Advisory Given  Plan Discussed with: Anesthesiologist, CRNA and Surgeon  Anesthesia Plan Comments:         Anesthesia Quick Evaluation

## 2016-01-21 NOTE — Anesthesia Procedure Notes (Signed)
Procedure Name: LMA Insertion Date/Time: 01/21/2016 1:56 PM Performed by: Nelda Marseille Pre-anesthesia Checklist: Patient identified, Patient being monitored, Timeout performed, Emergency Drugs available and Suction available Patient Re-evaluated:Patient Re-evaluated prior to inductionOxygen Delivery Method: Circle system utilized Preoxygenation: Pre-oxygenation with 100% oxygen Intubation Type: IV induction Ventilation: Mask ventilation without difficulty LMA: LMA inserted LMA Size: 3.5 Tube type: Oral Number of attempts: 1 Placement Confirmation: positive ETCO2 and breath sounds checked- equal and bilateral Tube secured with: Tape Dental Injury: Teeth and Oropharynx as per pre-operative assessment

## 2016-01-21 NOTE — Op Note (Signed)
01/21/2016  2:57 PM  Patient:   Joshua Humphrey  Pre-Op Diagnosis:   Septic olecranon bursitis, left elbow.  Post-Op Diagnosis:   Same.  Procedure:   Excision of septic olecranon bursa, left elbow.  Surgeon:   Pascal Lux, MD  Assistant:   None  Anesthesia:   General LMA  Findings:   As above.  Complications:   None  EBL:   2 cc  Fluids:   500 cc crystalloid  TT:   38 minutes at 250 mmHg  Drains:   None  Closure:   Staples  Implants:   None  Brief Clinical Note:   The patient is a 82 year old male with a 5-6 week history of pain and swelling in the posterior aspect of his left elbow. The symptoms have persisted despite 3 courses of antibiotics, including doxycycline, clindamycin, and Keflex. His history and examination are consistent with a septic olecranon bursitis. The patient presents at this time for an open excision of the septic olecranon bursa of his left elbow.  Procedure:   The patient was brought into the operating room and lain in the supine position. After adequate general laryngeal mask anesthesia was obtained, the patient's left upper extremity was prepped with ChloraPrep solution before being draped sterilely. Preoperative antibiotics were administered. The limb was exsanguinated with an Esmarch and the tourniquet inflated to 250 mmHg. A curvilinear incision was made over the posterior lateral aspect of the left elbow. Incision was carried down through the subcutaneous cutaneous tissues to reach the bursa. The bursa was then dissected out circumferentially. During the dissection, the bursal sac was punctured, releasing a moderate amount of purulent-looking yellow fluid which was cultured. Once the bursa was excised in its entirety, the wound was copiously irrigated with bacitracin saline solution using bulb irrigation. The distal triceps tendon was noted to have been eroded focally, so this area was debrided and closed in a side-to-side fashion using several #0  Ethibond interrupted sutures. The wound itself was closed using 2-0 Vicryl interrupted sutures for the subcutaneous tissues in 2 layers before the skin was closed using staples. A total of 10 cc of 0.5% plain Sensorcaine was injected in and around the incision to help with postoperative analgesia before a sterile bulky dressing was applied to the wound. The arm was placed into a simple sling before the patient was awakened, extubated, and returned to the recovery room in satisfactory condition after tolerating the procedure well.

## 2016-01-22 ENCOUNTER — Encounter: Payer: Self-pay | Admitting: Surgery

## 2016-01-22 DIAGNOSIS — M71122 Other infective bursitis, left elbow: Secondary | ICD-10-CM

## 2016-01-22 HISTORY — DX: Other infective bursitis, left elbow: M71.122

## 2016-01-23 LAB — SURGICAL PATHOLOGY

## 2016-01-26 LAB — AEROBIC/ANAEROBIC CULTURE (SURGICAL/DEEP WOUND)

## 2016-01-26 LAB — AEROBIC/ANAEROBIC CULTURE W GRAM STAIN (SURGICAL/DEEP WOUND)

## 2016-03-06 DIAGNOSIS — R222 Localized swelling, mass and lump, trunk: Secondary | ICD-10-CM | POA: Insufficient documentation

## 2016-03-10 ENCOUNTER — Other Ambulatory Visit: Payer: Self-pay | Admitting: Surgery

## 2016-03-10 DIAGNOSIS — R222 Localized swelling, mass and lump, trunk: Secondary | ICD-10-CM

## 2016-03-18 ENCOUNTER — Other Ambulatory Visit: Payer: Self-pay | Admitting: Surgery

## 2016-03-18 ENCOUNTER — Other Ambulatory Visit: Payer: Self-pay | Admitting: Student

## 2016-03-18 ENCOUNTER — Ambulatory Visit
Admission: RE | Admit: 2016-03-18 | Discharge: 2016-03-18 | Disposition: A | Discharge status: Home / Self Care | Payer: Medicare Other | Source: Ambulatory Visit | Attending: Surgery | Admitting: Surgery

## 2016-03-18 DIAGNOSIS — M4802 Spinal stenosis, cervical region: Secondary | ICD-10-CM | POA: Diagnosis not present

## 2016-03-18 DIAGNOSIS — R222 Localized swelling, mass and lump, trunk: Secondary | ICD-10-CM | POA: Insufficient documentation

## 2016-03-18 DIAGNOSIS — M50321 Other cervical disc degeneration at C4-C5 level: Secondary | ICD-10-CM | POA: Insufficient documentation

## 2016-03-18 DIAGNOSIS — N888 Other specified noninflammatory disorders of cervix uteri: Secondary | ICD-10-CM

## 2016-03-31 DIAGNOSIS — Z0000 Encounter for general adult medical examination without abnormal findings: Secondary | ICD-10-CM | POA: Insufficient documentation

## 2016-04-07 ENCOUNTER — Other Ambulatory Visit: Payer: Medicare Other

## 2016-04-10 ENCOUNTER — Ambulatory Visit: Payer: Medicare Other

## 2016-09-06 DIAGNOSIS — M5442 Lumbago with sciatica, left side: Secondary | ICD-10-CM

## 2016-09-06 DIAGNOSIS — M48062 Spinal stenosis, lumbar region with neurogenic claudication: Secondary | ICD-10-CM | POA: Insufficient documentation

## 2016-09-06 DIAGNOSIS — M79604 Pain in right leg: Secondary | ICD-10-CM

## 2016-09-06 DIAGNOSIS — G894 Chronic pain syndrome: Secondary | ICD-10-CM | POA: Insufficient documentation

## 2016-09-06 DIAGNOSIS — R937 Abnormal findings on diagnostic imaging of other parts of musculoskeletal system: Secondary | ICD-10-CM | POA: Insufficient documentation

## 2016-09-06 DIAGNOSIS — Z79891 Long term (current) use of opiate analgesic: Secondary | ICD-10-CM | POA: Insufficient documentation

## 2016-09-06 DIAGNOSIS — G8929 Other chronic pain: Secondary | ICD-10-CM | POA: Insufficient documentation

## 2016-09-06 DIAGNOSIS — F1190 Opioid use, unspecified, uncomplicated: Secondary | ICD-10-CM | POA: Insufficient documentation

## 2016-09-06 DIAGNOSIS — M79605 Pain in left leg: Secondary | ICD-10-CM

## 2016-09-06 DIAGNOSIS — H25019 Cortical age-related cataract, unspecified eye: Secondary | ICD-10-CM | POA: Insufficient documentation

## 2016-09-06 DIAGNOSIS — M5441 Lumbago with sciatica, right side: Secondary | ICD-10-CM

## 2016-09-06 NOTE — Progress Notes (Signed)
Patient's Name: Joshua Humphrey  MRN: [id number]  Referring Provider: Montey Hora, MD  DOB: 1929-07-05  PCP: Kirk Ruths, MD  DOS: 09/08/2016  Note by: Kathlen Brunswick. Dossie Arbour, MD  Service setting: Ambulatory outpatient  Specialty: Interventional Pain Management  Location: [facility] (AMB) Pain Management Facility  Visit type: Initial Patient Evaluation  Patient type: New Patient   Primary Reason(s) for Visit: Encounter for initial evaluation of one or more chronic problems (new to examiner) potentially causing chronic pain, and posing a threat to normal musculoskeletal function. (Level of risk: High) CC: Back Pain (lower); Knee Pain (bilateral); and Joint Swelling (surgery left)  HPI  Joshua Humphrey is a 83 y.o. year old, male patient, who comes today to see [place] for the first time for an initial evaluation of his chronic pain. He has Lipoma of neck; Allergic rhinitis; Anemia, unspecified; Asthma; Cataract; Cortical senile cataract; GERD (gastroesophageal reflux disease); Health care maintenance; Hyperlipidemia, unspecified; Hypertension; Mass of subcutaneous tissue of back; Obesity, unspecified; Obstructive sleep apnea; Osteoarthritis; Post-operative state; Prostate cancer ([place]); Restless leg syndrome; Scoliosis (and kyphoscoliosis), idiopathic; Septic olecranon bursitis of left elbow; Spinal stenosis of lumbosacral region; Type 2 diabetes mellitus with stage 2 chronic kidney disease ([place]); Chronic pain syndrome; Long term (current) use of opiate analgesic; Long term prescription opiate use; Opiate use; Chronic low back pain (Location of Secondary source of pain) (Bilateral) (L>R); Chronic lower extremity pain (Location of Tertiary source of pain) (Bilateral) (L>R); Abnormal MRI, lumbar spine; Abnormal MRI, cervical spine; Spinal stenosis, lumbar region, with neurogenic claudication; Disturbance of skin sensation; Chronic hip pain (Location of Primary Source of Pain) (Left); Chronic knee pain (Bilateral) (L>R);  Generalized weakness; Muscle weakness (generalized); and Chronic Lumbar radicular pain (Left) (L5) on his problem list. Today he comes in for evaluation of his Back Pain (lower); Knee Pain (bilateral); and Joint Swelling (surgery left)  Pain Assessment: Location: Lower Back Radiating: buttocks/hips bilaterally right worse Onset: More than a month ago Duration: Chronic pain Quality: Aching, Discomfort, Sore (pain comes and goes) Severity: 0-No pain (because pt sitting in hard seat-)/10 (self-reported pain score)  Note: Reported level is compatible with observation.                   Effect on ADL: hard to climb steps. get on a bus  but I function the best I can Timing: Intermittent Modifying factors:  (lying down-rest)  Onset and Duration: Gradual and Date of onset: 2 years Cause of pain: "aging" Severity: Getting worse, NAS-11 at its worse: 8/10, NAS-11 at its best: 3/10, NAS-11 now: 5/10 and NAS-11 on the average: 5/10 Timing: During activity or exercise and After activity or exercise Aggravating Factors: Kneeling, Lifiting, Prolonged standing, Stooping  and Walking Alleviating Factors: Lying down, Medications, Resting, Sitting and Sleeping Associated Problems: Erectile dysfunction and Impotence Quality of Pain: Aching, Annoying, Disabling, Tiring and Uncomfortable Previous Examinations or Tests: Bone scan and CT scan Previous Treatments: Physical Therapy  The patient comes into the clinics today for the first time for a chronic pain management evaluation. The patient comes into the clinics today in a wheelchair but he indicates that it is due to the fact that he cannot walk long distances. According to the patient is primary area of pain is that of the left hip. He denies any prior surgeries but he does admit to having had a joint injection approximately 1 year ago at an Optometrist in [place]. He indicates that they have a pain specialist by the  name of Dr. Julien Girt did to  injection. He also indicates having had physical therapy approximately 8 months ago which consisted of 2 visits per week 3-4 months. He indicates that it did help with his ability to walk and his balance. He also indicates having had some x-rays of the hip done at Dr. Clydell Humphrey practice.  The patient's secondary area of pain is that of the lower back and buttocks with the lower back pain being in the midline spreading bilaterally. He describes the left side to be worse than the right. He denies any surgeries, nerve blocks, but does indicate having had 3 injections in the back by a pain specialist in [place] binder name of Dr. Davis Gourd. He indicates that she worked at an [employer] and that the injections were done under fluoroscopic guidance, but he is unable to tell me what type of injection he had. In any case he describes that they seem to have helped for a short period time. This took place approximately 2 years ago. He also indicates having had some injections done for his buttocks pain at [facility] by Dr. Bland Span.  The patient's third area of pain is described to be that of the lower extremities with the left being worse than the right. In the case of the left lower extremity the pain goes all the way down into the top of his foot following what appears to be an L5 dermatomal distribution. The pain is primarily in the area of the upper thigh. The pain is described to run down the posterior aspect of the leg and he denies any surgeries, nerve blocks, joint injections, physical therapy, or x-rays of the leg. In the case of the right lower extremity he describes the pain to stop at the level of the knee and to run through the posterior aspect of the leg. This type of pain appears to be more "referred".  The next area of pain is described to be that of the knees with the left being worse than the right. Once again, the patient denies any surgeries, but he admits to having had some joint  injections into the knees, bilaterally, with the last one having been approximately 8 months ago by Joshua Maria PA-C at Dr. Clydell Humphrey practice. He indicates that the injections did not seem to help. He had a series of 3. He denies any physical therapy or x-rays except for those done at the [facility].  The patient indicates that he has been treating his pain for the past year using Tylenol and gabapentin. He indicates taking gabapentin 300 mg 1 tablet by mouth 3 times a day which does seem to help. He indicates that he started taking that when he had the shingles approximately 3-4 years ago in the left anterior thoracic area. He denies having any postherpetic neuralgia. However, he indicates that the gabapentin continues to give him some relief of his other pains. He also indicates that the meloxicam use to help his pain but this was discontinued by his primary care physician due to proteinuria.  It is significant to note that the patient describes having problems with balance for which he has to use a walker at home and also experiencing recurrent intermittent episodes of generalized weakness that have caused him to be unable to stand. He denies having had any workups for neuromuscular diseases.  He indicates being here due to a request that he posed to his primary care physician to prescribe tramadol for him. Apparently he was temporarily  admitted to "[facility]" due to the weakness spells and while he was there he received some tramadol that seemed to help his pain.  Today I took the time to provide the patient with information regarding my pain practice. The patient was informed that my practice is divided into two sections: an interventional pain management section, as well as a completely separate and distinct medication management section. I explained that I have procedure days for my interventional therapies, and evaluation days for follow-ups and medication management. Because of the  amount of documentation required during both, they are kept separated. This means that there is the possibility that he may be scheduled for a procedure on one day, and medication management the next. I have also informed him that because of staffing and facility limitations, I no longer take patients for medication management only. To illustrate the reasons for this, I gave the patient the example of surgeons, and how inappropriate it would be to refer a patient to his/her care, just to write for the post-surgical antibiotics on a surgery done by a different surgeon.   Because interventional pain management is my board-certified specialty, the patient was informed that joining my practice means that they are open to any and all interventional therapies. I made it clear that this does not mean that they will be forced to have any procedures done. What this means is that I believe interventional therapies to be essential part of the diagnosis and proper management of chronic pain conditions. Therefore, patients not interested in these interventional alternatives will be better served under the care of a different practitioner.  The patient was also made aware of my Comprehensive Pain Management Safety Guidelines where by joining my practice, they limit all of their nerve blocks and joint injections to those done by our practice, for as long as we are retained to manage their care.   Historic Controlled Substance Pharmacotherapy Review  PMP and historical list of controlled substances: Tramadol 50 mg; dramatic syrup; hydrocodone/APAP 5/325; hydrocodone suspension. Highest opioid analgesic regimen found: Hydrocodone/APAP 5/325 2 tablets by mouth every 6 hours (40 mg/day of hydrocodone) (40 MME/Day) Most recent opioid analgesic: Tramadol 50 mg 1 tablet by mouth every 4 hours (6 tab(s)/day) (300 mg/day of tramadol) (30 MME/Day) Current opioid analgesics:  Tramadol 50 mg 1 tablet by mouth every 4 hours (6  tab(s)/day) (300 mg/day of tramadol) (30 MME/Day) Highest recorded MME/day: 40 mg/day MME/day: 30 mg/day Medications: The patient did not bring the medication(s) to the appointment, as requested in our "New Patient Package" Pharmacodynamics: Desired effects: Analgesia: The patient reports >50% benefit. Reported improvement in function: The patient reports medication allows him to accomplish basic ADLs. Clinically meaningful improvement in function (CMIF): Sustained CMIF goals met Perceived effectiveness: Described as relatively effective, allowing for increase in activities of daily living (ADL) Undesirable effects: Side-effects or Adverse reactions: None reported Historical Monitoring: The patient  reports that he does not use drugs. List of all UDS Test(s): No results found for: MDMA, COCAINSCRNUR, PCPSCRNUR, PCPQUANT, CANNABQUANT, THCU, [place] List of all Serum Drug Screening Test(s):  No results found for: AMPHSCRSER, BARBSCRSER, BENZOSCRSER, COCAINSCRSER, PCPSCRSER, PCPQUANT, THCSCRSER, CANNABQUANT, OPIATESCRSER, OXYSCRSER, PROPOXSCRSER Historical Background Evaluation: [place] PDMP: Six (6) year initial data search conducted.             [place] Department of public safety, offender search: Editor, commissioning Information) Non-contributory Risk Assessment Profile: Aberrant behavior: None observed or detected today Risk factors for fatal opioid overdose: None identified today  Fatal overdose hazard ratio (HR): Calculation deferred Non-fatal overdose hazard ratio (HR): Calculation deferred Risk of opioid abuse or dependence: 0.7-3.0% with doses ? 36 MME/day and 6.1-26% with doses ? 120 MME/day. Substance use disorder (SUD) risk level: Pending results of Medical Psychology Evaluation for SUD Opioid risk tool (ORT) (Total Score): 3  ORT Scoring interpretation table:  Score <3 = Low Risk for SUD  Score between 4-7 = Moderate Risk for SUD  Score >8 = High Risk for Opioid Abuse   PHQ-2 Depression Scale:   Total score: 0  PHQ-2 Scoring interpretation table: (Score and probability of major depressive disorder)  Score 0 = No depression  Score 1 = 15.4% Probability  Score 2 = 21.1% Probability  Score 3 = 38.4% Probability  Score 4 = 45.5% Probability  Score 5 = 56.4% Probability  Score 6 = 78.6% Probability   PHQ-9 Depression Scale:  Total score: 0  PHQ-9 Scoring interpretation table:  Score 0-4 = No depression  Score 5-9 = Mild depression  Score 10-14 = Moderate depression  Score 15-19 = Moderately severe depression  Score 20-27 = Severe depression (2.4 times higher risk of SUD and 2.89 times higher risk of overuse)   Pharmacologic Plan: Pending ordered tests and/or consults            Initial impression: Pending review of available data and ordered tests.  Meds   Current Meds  Medication Sig  . acetaminophen (TYLENOL) 650 MG CR tablet Take 650-1,300 mg by mouth every 8 (eight) hours as needed (for pain.).   Marland Kitchen aspirin EC 81 MG tablet Take 81 mg by mouth daily.   Marland Kitchen atorvastatin (LIPITOR) 40 MG tablet Take 40 mg by mouth daily.   . beta carotene w/minerals (OCUVITE) tablet Take 1 tablet by mouth every evening.   . Calcium Carbonate-Vitamin D (CALTRATE 600+D PO) Take 2 tablets by mouth every evening.   . carvedilol (COREG) 25 MG tablet TAKE 1 TABLET (25 MG) TWICE DAILY  . cetirizine (ZYRTEC) 10 MG tablet Take 10 mg by mouth every evening.   . ferrous fumarate (HEMOCYTE - 106 MG FE) 325 (106 FE) MG TABS tablet Take 1 tablet by mouth daily.  Marland Kitchen FLAXSEED, LINSEED, PO Take by mouth daily.  . fluticasone (FLONASE) 50 MCG/ACT nasal spray Place 1-2 sprays into both nostrils daily as needed (for allergies.).   Marland Kitchen furosemide (LASIX) 40 MG tablet TAKE 1 TABLET BY MOUTH TWICE DAILY  . gabapentin (NEURONTIN) 300 MG capsule TAKE 1 CAPSULE (300 MG) BY MOUTH THREE TIMES DAILY  . Glucosamine-Chondroit-Vit C-Mn (GLUCOSAMINE-CHONDROITIN) CAPS Take 2 capsules by mouth daily.  . hydrALAZINE (APRESOLINE)  50 MG tablet Take 50 mg by mouth 3 (three) times daily.   . Ipratropium-Albuterol (COMBIVENT RESPIMAT) 20-100 MCG/ACT AERS respimat INHALE 2 PUFF FOUR TIMES DAILY AS NEEDED FOR SHORTNESS OF BREATH AND/OR WHEEZING.  Ernest Mallick FLEXTOUCH 100 UNIT/ML Pen Inject 15 Units into the skin every evening.   Marland Kitchen losartan (COZAAR) 100 MG tablet Take 100 mg by mouth daily.   . metFORMIN (GLUCOPHAGE) 850 MG tablet Take 850 mg by mouth 2 (two) times daily.   . mometasone-formoterol (DULERA) 200-5 MCG/ACT AERO Inhale 2 puffs into the lungs 2 (two) times daily.   . montelukast (SINGULAIR) 10 MG tablet Take 10 mg by mouth at bedtime.  . Multiple Vitamins-Minerals (OCUVITE ADULT 50+) CAPS Take 1 capsule by mouth daily.   Marland Kitchen NIFEdipine (PROCARDIA XL/ADALAT-CC) 90 MG 24 hr tablet Take 90 mg by mouth daily.   Marland Kitchen  omeprazole (PRILOSEC) 20 MG capsule Take 20 mg by mouth daily.  . potassium chloride SA (K-DUR,KLOR-CON) 20 MEQ tablet Take 20 mEq by mouth 2 (two) times daily.   . pramipexole (MIRAPEX) 0.25 MG tablet Take 0.25 mg by mouth at bedtime as needed (for restless leg syndrome).   . sitaGLIPtin (JANUVIA) 100 MG tablet Take 100 mg by mouth daily.    Imaging Review  Cervical Imaging: Cervical MR wo contrast:  Results for orders placed during the hospital encounter of 03/18/16  MR CERVICAL SPINE WO CONTRAST   Narrative CLINICAL DATA:  Subcutaneous mass in the lower neck/upper back which has returned after remote surgical removal.  EXAM: MRI CERVICAL SPINE WITHOUT CONTRAST  TECHNIQUE: Multiplanar, multisequence MR imaging of the cervical spine was performed. No intravenous contrast was administered.  COMPARISON:  None.  FINDINGS: Sagittal and axial sequences were adjusted caudally compared to a standard cervical spine MRI to completely cover the subcutaneous mass. The skullbase and C1-2 are incompletely imaged on sagittal sequences, and axial imaging extends from the C4-T3 levels.  Alignment: Cervical spine  straightening.  No listhesis.  Vertebrae: Preserve vertebral body heights without evidence of fracture or suspicious osseous lesion. Mild multilevel type 2 degenerative endplate changes.  Cord: Normal signal.  Posterior Fossa, vertebral arteries, paraspinal tissues: A large subcutaneous mass is present in the posterior neck extending from the C4-T2 levels. This measures 13.9 x 6.3 x 10.9 cm (transverse x AP x craniocaudal) and is of homogeneous fat signal aside from scattered internal vessels and thin internal septations.  Disc levels:  C2-3: Only imaged sagittally. Mild facet arthrosis without significant stenosis.  C3-4: Only imaged sagittally. Moderate disc space narrowing. Uncovertebral spurring and facet arthrosis with ankylosis. No significant spinal stenosis.  C4-5: Moderate disc space narrowing. Disc bulging, right paracentral disc protrusion, uncovertebral spurring, and moderate facet arthrosis result in moderate spinal stenosis with mild cord flattening and moderate bilateral neural foraminal stenosis.  C5-6: Mild disc space narrowing. Mild disc bulging and uncovertebral spurring result in mild bilateral neural foraminal stenosis without spinal stenosis.  C6-7: Moderate disc space narrowing. Uncovertebral spurring results in mild right and moderate left neural foraminal stenosis. No spinal stenosis.  C7-T1: Moderate disc space narrowing. Disc bulging and left greater than right uncovertebral spurring result in mild right and severe left neural foraminal stenosis without significant spinal stenosis.  No disc herniation or stenosis in the visualized upper thoracic spine.  IMPRESSION: 1. 14 cm subcutaneous lipoma in the posterior lower neck. 2. Moderate cervical disc degeneration, most notable at C4-5 where there is moderate spinal and bilateral neural foraminal stenosis.   Electronically Signed   By: Logan Bores M.D.   On: 03/18/2016 16:39    Lumbosacral  Imaging: Lumbar MR wo contrast:  Results for orders placed during the hospital encounter of 07/30/15  MR Lumbar Spine Wo Contrast   Narrative CLINICAL DATA:  Low back pain with bilateral buttock and leg pain. Lumbar spinal stenosis.  EXAM: MRI LUMBAR SPINE WITHOUT CONTRAST  TECHNIQUE: Multiplanar, multisequence MR imaging of the lumbar spine was performed. No intravenous contrast was administered.  COMPARISON:  Lumbar radiographs 07/04/2015  FINDINGS: Segmentation:  Normal segmentation.  Moderate dextroscoliosis at L3.  Alignment:  Normal alignment  Vertebrae: Negative for fracture or mass lesion. Bone marrow signal normal.  Conus medullaris: Extends to the L1-2 level and appears normal.  Paraspinal and other soft tissues: 15 mm left renal cyst. No retroperitoneal mass. Paraspinous muscles show symmetric atrophy.  Disc levels:  L1-2:  Mild disc and facet degeneration with mild spinal stenosis  L2-3: Advanced disc degeneration on the left with disc space narrowing and endplate osteophyte formation. Bilateral facet hypertrophy. Left foraminal encroachment due to bony overgrowth. Moderate spinal stenosis.  L3-4: Advanced disc degeneration on the left with disc space narrowing and endplate spurring. Moderate left foraminal encroachment. Mild spinal stenosis. Mild right foraminal narrowing.  L4-5: Diffuse disc bulging with central disc protrusion. Advanced facet hypertrophy bilaterally. Marked left foraminal encroachment and moderate to severe right foraminal encroachment with impingement of the L4 nerve root bilaterally. Severe spinal stenosis. Subarticular stenosis right greater than left.  L5-S1: Disc degeneration and spurring right greater than left. Moderate right foraminal encroachment with impingement of the right L5 nerve root. Left foramen patent.  IMPRESSION: Moderate dextroscoliosis.  Multilevel degenerative change.  L2-3 moderate spinal stenosis with left  foraminal encroachment  Mild spinal stenosis L3-4 with moderate left foraminal encroachment and mild right foraminal encroachment  Severe spinal stenosis L4-5. Marked left foraminal encroachment and moderate to severe right foraminal encroachment.  Right foraminal encroachment L5-S1 due to bony overgrowth.   Electronically Signed   By: Franchot Gallo M.D.   On: 07/30/2015 15:25    Complexity Note: Imaging results reviewed. Results discussed using Layman's terms.               ROS  Cardiovascular History: Daily Aspirin intake and High blood pressure Pulmonary or Respiratory History: Wheezing and difficulty taking a deep full breath (Asthma) and Temporary stoppage of breathing during sleep Neurological History: No reported neurological signs or symptoms such as seizures, abnormal skin sensations, urinary and/or fecal incontinence, being born with an abnormal open spine and/or a tethered spinal cord Review of Past Neurological Studies: No results found for this or any previous visit. Psychological-Psychiatric History: No reported psychological or psychiatric signs or symptoms such as difficulty sleeping, anxiety, depression, delusions or hallucinations (schizophrenial), mood swings (bipolar disorders) or suicidal ideations or attempts Gastrointestinal History: Reflux or heatburn Genitourinary History: No reported renal or genitourinary signs or symptoms such as difficulty voiding or producing urine, peeing blood, non-functioning kidney, kidney stones, difficulty emptying the bladder, difficulty controlling the flow of urine, or chronic kidney disease Hematological History: Weakness due to low blood hemoglobin or red blood cell count (Anemia) Endocrine History: High blood sugar requiring insulin (IDDM) Rheumatologic History: No reported rheumatological signs and symptoms such as fatigue, joint pain, tenderness, swelling, redness, heat, stiffness, decreased range of motion, with or without  associated rash Musculoskeletal History: Negative for myasthenia gravis, muscular dystrophy, multiple sclerosis or malignant hyperthermia Work History: Retired  Allergies  Joshua Humphrey has No Known Allergies.  Laboratory Chemistry  Inflammation Markers (CRP: Acute Phase) (ESR: Chronic Phase) No results found for: CRP, ESRSEDRATE               Renal Function Markers Lab Results  Component Value Date   BUN 27 (H) 01/20/2016   CREATININE 0.85 01/20/2016   GFRAA >60 01/20/2016   GFRNONAA >60 01/20/2016                 Hepatic Function Markers No results found for: AST, ALT, ALBUMIN, ALKPHOS, HCVAB               Electrolytes Lab Results  Component Value Date   NA 141 01/20/2016   K 4.1 01/20/2016   CL 106 01/20/2016   CALCIUM 9.3 01/20/2016  Neuropathy Markers No results found for: [id number]               Bone Pathology Markers Lab Results  Component Value Date   CALCIUM 9.3 01/20/2016                 Coagulation Parameters Lab Results  Component Value Date   PLT 275 01/20/2016                 Cardiovascular Markers Lab Results  Component Value Date   HGB 12.1 (L) 01/20/2016   HCT 36.3 (L) 01/20/2016                 Note: Lab results reviewed.  [place]  Drug: Joshua Humphrey  reports that he does not use drugs. Alcohol:  reports that he drinks alcohol. Tobacco:  reports that he has never smoked. He has never used smokeless tobacco. Medical:  has a past medical history of Anemia; Asthma; Cancer ([place]); Cancer ([place]); Diabetes mellitus without complication ([place]); GERD (gastroesophageal reflux disease); Hypertension; PONV (postoperative nausea and vomiting); and Sleep apnea. Family: family history includes Alcohol abuse in his father; Cancer in his brother and mother.  Past Surgical History:  Procedure Laterality Date  . CATARACT EXTRACTION, BILATERAL    . ELBOW SURGERY    . EYE SURGERY    . lipoma removal  2000   back   . OLECRANON BURSECTOMY Left  01/21/2016   Procedure: OLECRANON BURSA;  Surgeon: Corky Mull, MD;  Location: [facility] ORS;  Service: Orthopedics;  Laterality: Left;  . PROSTATE SURGERY  2002  . TOE SURGERY    . TONSILLECTOMY    . tumor removed  2000   Active Ambulatory Problems    Diagnosis Date Noted  . Lipoma of neck 01/09/2014  . Allergic rhinitis 11/25/2011  . Anemia, unspecified 11/25/2011  . Asthma 11/25/2011  . Cataract 10/29/2011  . Cortical senile cataract 09/06/2016  . GERD (gastroesophageal reflux disease) 11/25/2011  . Health care maintenance 03/31/2016  . Hyperlipidemia, unspecified 11/25/2011  . Hypertension 11/25/2011  . Mass of subcutaneous tissue of back 03/06/2016  . Obesity, unspecified 11/25/2011  . Obstructive sleep apnea 11/25/2011  . Osteoarthritis 11/25/2011  . Post-operative state 12/31/2011  . Prostate cancer ([place]) 11/25/2011  . Restless leg syndrome 12/30/2011  . Scoliosis (and kyphoscoliosis), idiopathic 10/22/2015  . Septic olecranon bursitis of left elbow 01/22/2016  . Spinal stenosis of lumbosacral region 10/22/2015  . Type 2 diabetes mellitus with stage 2 chronic kidney disease ([place]) 10/29/2011  . Chronic pain syndrome 09/06/2016  . Long term (current) use of opiate analgesic 09/06/2016  . Long term prescription opiate use 09/06/2016  . Opiate use 09/06/2016  . Chronic low back pain (Location of Secondary source of pain) (Bilateral) (L>R) 09/06/2016  . Chronic lower extremity pain (Location of Tertiary source of pain) (Bilateral) (L>R) 09/06/2016  . Abnormal MRI, lumbar spine 09/06/2016  . Abnormal MRI, cervical spine 09/06/2016  . Spinal stenosis, lumbar region, with neurogenic claudication 09/06/2016  . Disturbance of skin sensation 09/08/2016  . Chronic hip pain (Location of Primary Source of Pain) (Left) 09/08/2016  . Chronic knee pain (Bilateral) (L>R) 09/08/2016  . Generalized weakness 09/08/2016  . Muscle weakness (generalized) 09/08/2016  . Chronic Lumbar radicular  pain (Left) (L5) 09/08/2016   Resolved Ambulatory Problems    Diagnosis Date Noted  . No Resolved Ambulatory Problems   Past Medical History:  Diagnosis Date  . Anemia   . Asthma   . Cancer (  [place])   . Cancer ([place])   . Diabetes mellitus without complication ([place])   . GERD (gastroesophageal reflux disease)   . Hypertension   . PONV (postoperative nausea and vomiting)   . Sleep apnea    Constitutional Exam  General appearance: Well nourished, well developed, and well hydrated. In no apparent acute distress Vitals:   09/08/16 0924  BP: (!) 164/77  Pulse: 64  Resp: 16  Temp: 97.7 F (36.5 C)  SpO2: 96%  Weight: 222 lb (100.7 kg)  Height: 5' 9" (1.753 m)   BMI Assessment: Estimated body mass index is 32.78 kg/m as calculated from the following:   Height as of this encounter: 5' 9" (1.753 m).   Weight as of this encounter: 222 lb (100.7 kg).  BMI interpretation table: BMI level Category Range association with higher incidence of chronic pain  <18 kg/m2 Underweight   18.5-24.9 kg/m2 Ideal body weight   25-29.9 kg/m2 Overweight Increased incidence by 20%  30-34.9 kg/m2 Obese (Class I) Increased incidence by 68%  35-39.9 kg/m2 Severe obesity (Class II) Increased incidence by 136%  >40 kg/m2 Extreme obesity (Class III) Increased incidence by 254%   BMI Readings from Last 4 Encounters:  09/08/16 32.78 kg/m  01/21/16 31.45 kg/m  01/20/16 31.45 kg/m  01/09/16 31.57 kg/m   Wt Readings from Last 4 Encounters:  09/08/16 222 lb (100.7 kg)  01/21/16 213 lb (96.6 kg)  01/20/16 213 lb (96.6 kg)  01/09/16 213 lb 12.8 oz (97 kg)  Psych/Mental status: Alert, oriented x 3 (person, place, & time)       Eyes: PERLA Respiratory: No evidence of acute respiratory distress  Cervical Spine Exam  Inspection: No masses, redness, or swelling Alignment: Symmetrical Functional ROM: Unrestricted ROM      Stability: No instability detected Muscle strength & Tone: Functionally  intact Sensory: Unimpaired Palpation: No palpable anomalies              Upper Extremity (UE) Exam    Side: Right upper extremity  Side: Left upper extremity  Inspection: No masses, redness, swelling, or asymmetry. No contractures  Inspection: No masses, redness, swelling, or asymmetry. No contractures  Functional ROM: Unrestricted ROM          Functional ROM: Unrestricted ROM          Muscle strength & Tone: Functionally intact  Muscle strength & Tone: Functionally intact  Sensory: Unimpaired  Sensory: Unimpaired  Palpation: No palpable anomalies              Palpation: No palpable anomalies              Specialized Test(s): Deferred         Specialized Test(s): Deferred          Thoracic Spine Exam  Inspection: No masses, redness, or swelling Alignment: Symmetrical Functional ROM: Unrestricted ROM Stability: No instability detected Sensory: Unimpaired Muscle strength & Tone: No palpable anomalies  Lumbar Spine Exam  Inspection: No masses, redness, or swelling Alignment: Symmetrical Functional ROM: Unrestricted ROM      Stability: No instability detected Muscle strength & Tone: Functionally intact Sensory: Unimpaired Palpation: No palpable anomalies       Provocative Tests: Lumbar Hyperextension and rotation test: evaluation deferred today       Patrick's Maneuver: evaluation deferred today                    Gait & Posture Assessment  Ambulation: Patient came in today in a  wheel chair Gait: Very limited, using assistive device to ambulate Posture: Antalgic   Lower Extremity Exam    Side: Right lower extremity  Side: Left lower extremity  Inspection: No masses, redness, swelling, or asymmetry. No contractures  Inspection: No masses, redness, swelling, or asymmetry. No contractures  Functional ROM: Unrestricted ROM          Functional ROM: Diminished ROM          Muscle strength & Tone: Moderate-to-severe deconditioning  Muscle strength & Tone: Moderate-to-severe  deconditioning  Sensory: Unimpaired  Sensory: Unimpaired  Palpation: No palpable anomalies  Palpation: No palpable anomalies   Assessment  Primary Diagnosis & Pertinent Problem List: Diagnoses of Chronic bilateral low back pain with bilateral sciatica, Chronic pain of lower extremity, bilateral, Spinal stenosis, lumbar region, with neurogenic claudication, Chronic pain syndrome, Abnormal MRI, lumbar spine, Abnormal MRI, cervical spine, Long term (current) use of opiate analgesic, Long term prescription opiate use, Opiate use, Disturbance of skin sensation, Chronic hip pain (Location of Primary Source of Pain) (Left), Chronic knee pain (Bilateral) (L>R), Generalized weakness, Muscle weakness (generalized), and Chronic Lumbar radicular pain (Left) (L5) were pertinent to this visit.  Visit Diagnosis (New problems to examiner): 1. Chronic bilateral low back pain with bilateral sciatica   2. Chronic pain of lower extremity, bilateral   3. Spinal stenosis, lumbar region, with neurogenic claudication   4. Chronic pain syndrome   5. Abnormal MRI, lumbar spine   6. Abnormal MRI, cervical spine   7. Long term (current) use of opiate analgesic   8. Long term prescription opiate use   9. Opiate use   10. Disturbance of skin sensation   11. Chronic hip pain (Location of Primary Source of Pain) (Left)   12. Chronic knee pain (Bilateral) (L>R)   13. Generalized weakness   14. Muscle weakness (generalized)   15. Chronic Lumbar radicular pain (Left) (L5)    Plan of Care (Initial workup plan)  Note: Please be advised that as per protocol, today's visit has been an evaluation only. We have not taken over the patient's controlled substance management.  Problem-specific plan: No problem-specific Assessment & Plan notes found for this encounter.  Ordered Lab-work, Procedure(s), Referral(s), & Consult(s): Orders Placed This Encounter  Procedures  . Compliance Drug Analysis, Ur  . Comprehensive metabolic  panel  . C-reactive protein  . Sedimentation rate  . Magnesium  . 25-Hydroxyvitamin D Lcms D2+D3  . Vitamin B12  . Ambulatory referral to Psychology  . Ambulatory referral to Neurology   Pharmacotherapy (current): Medications ordered:  No orders of the defined types were placed in this encounter.  Medications administered during this visit: Joshua Humphrey had no medications administered during this visit.   Pharmacological management options:  Opioid Analgesics: The patient was informed that there is no guarantee that he would be a candidate for opioid analgesics. The decision will be made following CDC guidelines. This decision will be based on the results of diagnostic studies, as well as Joshua Humphrey risk profile.   Membrane stabilizer: To be determined at a later time  Muscle relaxant: To be determined at a later time  NSAID: To be determined at a later time  Other analgesic(s): To be determined at a later time   Interventional management options: Joshua Humphrey was informed that there is no guarantee that he would be a candidate for interventional therapies. The decision will be based on the results of diagnostic studies, as well as Joshua Humphrey risk profile.  Procedure(s)  under consideration:  Diagnostic left intra-articular hip joint injection under fluoroscopic guidance and IV sedation  Possible diagnostic left femoral nerve + obturator nerve block  Possible left femoral nerve + obturator nerve RFA  Diagnostic bilateral lumbar facet block  Possible bilateral lumbar facet RFA  Diagnostic left L4-5 lumbar epidural steroid injection  Diagnostic bilateral intra-articular knee joint injection with local anesthetic and steroids  Possible series of 5 bilateral intra-articular Hyalgan knee injections Diagnostic bilateral genicular nerve block  Possible bilateral genicular nerve RFA    Provider-requested follow-up: Return for 2nd Visit, after [facility] eval.  No future  appointments.  Primary Care Physician: Kirk Ruths, MD Location: [facility] Note by: Kathlen Brunswick. Dossie Arbour, M.D, DABA, DABAPM, DABPM, DABIPP, FIPP Date: 09/08/2016; Time: 1:47 PM  There are no Patient Instructions on file for this visit.

## 2016-09-08 ENCOUNTER — Other Ambulatory Visit: Payer: Self-pay | Admitting: Pain Medicine

## 2016-09-08 ENCOUNTER — Encounter: Payer: Self-pay | Admitting: Pain Medicine

## 2016-09-08 ENCOUNTER — Telehealth: Payer: Self-pay

## 2016-09-08 ENCOUNTER — Ambulatory Visit: Payer: Medicare Other | Attending: Pain Medicine | Admitting: Pain Medicine

## 2016-09-08 DIAGNOSIS — M25569 Pain in unspecified knee: Secondary | ICD-10-CM | POA: Insufficient documentation

## 2016-09-08 DIAGNOSIS — E785 Hyperlipidemia, unspecified: Secondary | ICD-10-CM | POA: Insufficient documentation

## 2016-09-08 DIAGNOSIS — M7989 Other specified soft tissue disorders: Secondary | ICD-10-CM | POA: Insufficient documentation

## 2016-09-08 DIAGNOSIS — R937 Abnormal findings on diagnostic imaging of other parts of musculoskeletal system: Secondary | ICD-10-CM

## 2016-09-08 DIAGNOSIS — M5441 Lumbago with sciatica, right side: Secondary | ICD-10-CM

## 2016-09-08 DIAGNOSIS — J45909 Unspecified asthma, uncomplicated: Secondary | ICD-10-CM | POA: Insufficient documentation

## 2016-09-08 DIAGNOSIS — M5410 Radiculopathy, site unspecified: Secondary | ICD-10-CM

## 2016-09-08 DIAGNOSIS — K219 Gastro-esophageal reflux disease without esophagitis: Secondary | ICD-10-CM | POA: Insufficient documentation

## 2016-09-08 DIAGNOSIS — E1122 Type 2 diabetes mellitus with diabetic chronic kidney disease: Secondary | ICD-10-CM | POA: Diagnosis not present

## 2016-09-08 DIAGNOSIS — M545 Low back pain: Secondary | ICD-10-CM | POA: Diagnosis present

## 2016-09-08 DIAGNOSIS — G894 Chronic pain syndrome: Secondary | ICD-10-CM

## 2016-09-08 DIAGNOSIS — R531 Weakness: Secondary | ICD-10-CM

## 2016-09-08 DIAGNOSIS — M4807 Spinal stenosis, lumbosacral region: Secondary | ICD-10-CM | POA: Insufficient documentation

## 2016-09-08 DIAGNOSIS — R209 Unspecified disturbances of skin sensation: Secondary | ICD-10-CM

## 2016-09-08 DIAGNOSIS — M79605 Pain in left leg: Secondary | ICD-10-CM | POA: Insufficient documentation

## 2016-09-08 DIAGNOSIS — R930 Abnormal findings on diagnostic imaging of skull and head, not elsewhere classified: Secondary | ICD-10-CM | POA: Diagnosis not present

## 2016-09-08 DIAGNOSIS — H25019 Cortical age-related cataract, unspecified eye: Secondary | ICD-10-CM | POA: Insufficient documentation

## 2016-09-08 DIAGNOSIS — G2581 Restless legs syndrome: Secondary | ICD-10-CM | POA: Diagnosis not present

## 2016-09-08 DIAGNOSIS — M5442 Lumbago with sciatica, left side: Secondary | ICD-10-CM | POA: Diagnosis not present

## 2016-09-08 DIAGNOSIS — Z79891 Long term (current) use of opiate analgesic: Secondary | ICD-10-CM | POA: Diagnosis not present

## 2016-09-08 DIAGNOSIS — N182 Chronic kidney disease, stage 2 (mild): Secondary | ICD-10-CM | POA: Diagnosis not present

## 2016-09-08 DIAGNOSIS — D649 Anemia, unspecified: Secondary | ICD-10-CM | POA: Diagnosis not present

## 2016-09-08 DIAGNOSIS — M5116 Intervertebral disc disorders with radiculopathy, lumbar region: Secondary | ICD-10-CM | POA: Diagnosis not present

## 2016-09-08 DIAGNOSIS — M25562 Pain in left knee: Secondary | ICD-10-CM | POA: Insufficient documentation

## 2016-09-08 DIAGNOSIS — M25552 Pain in left hip: Secondary | ICD-10-CM

## 2016-09-08 DIAGNOSIS — C61 Malignant neoplasm of prostate: Secondary | ICD-10-CM | POA: Insufficient documentation

## 2016-09-08 DIAGNOSIS — M6281 Muscle weakness (generalized): Secondary | ICD-10-CM

## 2016-09-08 DIAGNOSIS — M48062 Spinal stenosis, lumbar region with neurogenic claudication: Secondary | ICD-10-CM

## 2016-09-08 DIAGNOSIS — F1190 Opioid use, unspecified, uncomplicated: Secondary | ICD-10-CM | POA: Diagnosis not present

## 2016-09-08 DIAGNOSIS — M25561 Pain in right knee: Secondary | ICD-10-CM | POA: Diagnosis present

## 2016-09-08 DIAGNOSIS — M79604 Pain in right leg: Secondary | ICD-10-CM | POA: Diagnosis not present

## 2016-09-08 DIAGNOSIS — I129 Hypertensive chronic kidney disease with stage 1 through stage 4 chronic kidney disease, or unspecified chronic kidney disease: Secondary | ICD-10-CM | POA: Diagnosis not present

## 2016-09-08 DIAGNOSIS — E669 Obesity, unspecified: Secondary | ICD-10-CM | POA: Insufficient documentation

## 2016-09-08 DIAGNOSIS — G8929 Other chronic pain: Secondary | ICD-10-CM

## 2016-09-08 DIAGNOSIS — Z6832 Body mass index (BMI) 32.0-32.9, adult: Secondary | ICD-10-CM | POA: Insufficient documentation

## 2016-09-08 DIAGNOSIS — Z87442 Personal history of urinary calculi: Secondary | ICD-10-CM | POA: Insufficient documentation

## 2016-09-08 DIAGNOSIS — D170 Benign lipomatous neoplasm of skin and subcutaneous tissue of head, face and neck: Secondary | ICD-10-CM | POA: Diagnosis not present

## 2016-09-08 DIAGNOSIS — Z7982 Long term (current) use of aspirin: Secondary | ICD-10-CM | POA: Insufficient documentation

## 2016-09-08 DIAGNOSIS — G4733 Obstructive sleep apnea (adult) (pediatric): Secondary | ICD-10-CM | POA: Diagnosis not present

## 2016-09-08 DIAGNOSIS — R222 Localized swelling, mass and lump, trunk: Secondary | ICD-10-CM | POA: Insufficient documentation

## 2016-09-08 NOTE — Telephone Encounter (Signed)
Faxed pt information to Dr Tollie Pizza for psych eval

## 2016-09-08 NOTE — Progress Notes (Signed)
Safety precautions to be maintained throughout the outpatient stay will include: orient to surroundings, keep bed in low position, maintain call bell within reach at all times, provide assistance with transfer out of bed and ambulation.  

## 2016-09-08 NOTE — Telephone Encounter (Signed)
Sent patient information to Dr Gurney Maxin for neurosurgery referral

## 2016-09-12 LAB — COMPREHENSIVE METABOLIC PANEL
ALBUMIN: 4.2 g/dL (ref 3.5–4.7)
ALK PHOS: 64 IU/L (ref 39–117)
ALT: 15 IU/L (ref 0–44)
AST: 17 IU/L (ref 0–40)
Albumin/Globulin Ratio: 1.4 (ref 1.2–2.2)
BUN/Creatinine Ratio: 27 — ABNORMAL HIGH (ref 10–24)
BUN: 20 mg/dL (ref 8–27)
Bilirubin Total: 0.4 mg/dL (ref 0.0–1.2)
CO2: 22 mmol/L (ref 20–29)
CREATININE: 0.75 mg/dL — AB (ref 0.76–1.27)
Calcium: 9.2 mg/dL (ref 8.6–10.2)
Chloride: 105 mmol/L (ref 96–106)
GFR calc Af Amer: 97 mL/min/{1.73_m2} (ref >59)
GFR, EST NON AFRICAN AMERICAN: 84 mL/min/{1.73_m2} (ref >59)
GLOBULIN, TOTAL: 3 g/dL (ref 1.5–4.5)
GLUCOSE: 126 mg/dL — AB (ref 65–99)
Potassium: 3.8 mmol/L (ref 3.5–5.2)
Sodium: 144 mmol/L (ref 134–144)
Total Protein: 7.2 g/dL (ref 6.0–8.5)

## 2016-09-12 LAB — 25-HYDROXYVITAMIN D LCMS D2+D3: 25-HYDROXY, VITAMIN D: 41 ng/mL

## 2016-09-12 LAB — C-REACTIVE PROTEIN: CRP: 0.7 mg/L (ref 0.0–4.9)

## 2016-09-12 LAB — 25-HYDROXY VITAMIN D LCMS D2+D3: 25-Hydroxy, Vitamin D-3: 41 ng/mL

## 2016-09-12 LAB — VITAMIN B12: VITAMIN B 12: 264 pg/mL (ref 232–1245)

## 2016-09-12 LAB — SEDIMENTATION RATE: Sed Rate: 16 mm/hr (ref 0–30)

## 2016-09-12 LAB — MAGNESIUM: MAGNESIUM: 1.8 mg/dL (ref 1.6–2.3)

## 2016-09-14 LAB — COMPLIANCE DRUG ANALYSIS, UR

## 2016-10-05 ENCOUNTER — Ambulatory Visit: Payer: Medicare Other | Attending: Pain Medicine | Admitting: Pain Medicine

## 2016-10-05 ENCOUNTER — Encounter: Payer: Self-pay | Admitting: Pain Medicine

## 2016-10-05 VITALS — BP 151/68 | HR 63 | Temp 97.8°F | Ht 69.0 in | Wt 220.0 lb

## 2016-10-05 DIAGNOSIS — G8929 Other chronic pain: Secondary | ICD-10-CM | POA: Diagnosis not present

## 2016-10-05 DIAGNOSIS — R937 Abnormal findings on diagnostic imaging of other parts of musculoskeletal system: Secondary | ICD-10-CM | POA: Diagnosis not present

## 2016-10-05 DIAGNOSIS — N182 Chronic kidney disease, stage 2 (mild): Secondary | ICD-10-CM | POA: Diagnosis not present

## 2016-10-05 DIAGNOSIS — M150 Primary generalized (osteo)arthritis: Secondary | ICD-10-CM

## 2016-10-05 DIAGNOSIS — M4807 Spinal stenosis, lumbosacral region: Secondary | ICD-10-CM | POA: Insufficient documentation

## 2016-10-05 DIAGNOSIS — C61 Malignant neoplasm of prostate: Secondary | ICD-10-CM | POA: Insufficient documentation

## 2016-10-05 DIAGNOSIS — M792 Neuralgia and neuritis, unspecified: Secondary | ICD-10-CM

## 2016-10-05 DIAGNOSIS — Z794 Long term (current) use of insulin: Secondary | ICD-10-CM | POA: Insufficient documentation

## 2016-10-05 DIAGNOSIS — M79605 Pain in left leg: Secondary | ICD-10-CM

## 2016-10-05 DIAGNOSIS — M5442 Lumbago with sciatica, left side: Secondary | ICD-10-CM | POA: Diagnosis not present

## 2016-10-05 DIAGNOSIS — M79604 Pain in right leg: Secondary | ICD-10-CM | POA: Diagnosis not present

## 2016-10-05 DIAGNOSIS — G2581 Restless legs syndrome: Secondary | ICD-10-CM | POA: Diagnosis not present

## 2016-10-05 DIAGNOSIS — M25569 Pain in unspecified knee: Secondary | ICD-10-CM | POA: Diagnosis not present

## 2016-10-05 DIAGNOSIS — M4802 Spinal stenosis, cervical region: Secondary | ICD-10-CM | POA: Diagnosis not present

## 2016-10-05 DIAGNOSIS — E1122 Type 2 diabetes mellitus with diabetic chronic kidney disease: Secondary | ICD-10-CM | POA: Insufficient documentation

## 2016-10-05 DIAGNOSIS — Z79899 Other long term (current) drug therapy: Secondary | ICD-10-CM | POA: Insufficient documentation

## 2016-10-05 DIAGNOSIS — M5116 Intervertebral disc disorders with radiculopathy, lumbar region: Secondary | ICD-10-CM | POA: Insufficient documentation

## 2016-10-05 DIAGNOSIS — G894 Chronic pain syndrome: Secondary | ICD-10-CM | POA: Insufficient documentation

## 2016-10-05 DIAGNOSIS — G4733 Obstructive sleep apnea (adult) (pediatric): Secondary | ICD-10-CM | POA: Diagnosis not present

## 2016-10-05 DIAGNOSIS — D649 Anemia, unspecified: Secondary | ICD-10-CM | POA: Insufficient documentation

## 2016-10-05 DIAGNOSIS — E785 Hyperlipidemia, unspecified: Secondary | ICD-10-CM | POA: Diagnosis not present

## 2016-10-05 DIAGNOSIS — M6281 Muscle weakness (generalized): Secondary | ICD-10-CM | POA: Diagnosis not present

## 2016-10-05 DIAGNOSIS — M48062 Spinal stenosis, lumbar region with neurogenic claudication: Secondary | ICD-10-CM | POA: Diagnosis not present

## 2016-10-05 DIAGNOSIS — J45909 Unspecified asthma, uncomplicated: Secondary | ICD-10-CM | POA: Insufficient documentation

## 2016-10-05 DIAGNOSIS — E669 Obesity, unspecified: Secondary | ICD-10-CM | POA: Diagnosis not present

## 2016-10-05 DIAGNOSIS — D170 Benign lipomatous neoplasm of skin and subcutaneous tissue of head, face and neck: Secondary | ICD-10-CM | POA: Insufficient documentation

## 2016-10-05 DIAGNOSIS — M9983 Other biomechanical lesions of lumbar region: Secondary | ICD-10-CM

## 2016-10-05 DIAGNOSIS — K219 Gastro-esophageal reflux disease without esophagitis: Secondary | ICD-10-CM | POA: Diagnosis not present

## 2016-10-05 DIAGNOSIS — M159 Polyosteoarthritis, unspecified: Secondary | ICD-10-CM

## 2016-10-05 DIAGNOSIS — I129 Hypertensive chronic kidney disease with stage 1 through stage 4 chronic kidney disease, or unspecified chronic kidney disease: Secondary | ICD-10-CM | POA: Diagnosis not present

## 2016-10-05 DIAGNOSIS — H25019 Cortical age-related cataract, unspecified eye: Secondary | ICD-10-CM | POA: Insufficient documentation

## 2016-10-05 DIAGNOSIS — Z7982 Long term (current) use of aspirin: Secondary | ICD-10-CM | POA: Diagnosis not present

## 2016-10-05 DIAGNOSIS — M25552 Pain in left hip: Secondary | ICD-10-CM

## 2016-10-05 DIAGNOSIS — M5410 Radiculopathy, site unspecified: Secondary | ICD-10-CM | POA: Diagnosis not present

## 2016-10-05 DIAGNOSIS — M5441 Lumbago with sciatica, right side: Secondary | ICD-10-CM

## 2016-10-05 DIAGNOSIS — M48061 Spinal stenosis, lumbar region without neurogenic claudication: Secondary | ICD-10-CM

## 2016-10-05 MED ORDER — GABAPENTIN 300 MG PO CAPS: 300.0000 mg | ORAL_CAPSULE | Freq: Four times a day (QID) | ORAL | 2 refills | Status: DC

## 2016-10-05 NOTE — Progress Notes (Signed)
Patient's Name: Joshua Humphrey  MRN: [id number]  Referring Provider: Kirk Ruths, MD  DOB: [birth year]  PCP: Kirk Ruths, MD  DOS: 10/05/2016  Note by: Gaspar Cola, MD  Service setting: Ambulatory outpatient  Specialty: Interventional Pain Management  Location: [facility] (AMB) Pain Management Facility    Patient type: Established   Primary Reason(s) for Visit: Encounter for evaluation before starting new chronic pain management plan of care (Level of risk: moderate) CC: Back Pain (low); Leg Pain (left); and Knee Pain (bilateral)  HPI  Joshua Humphrey is a 83 y.o. year old, male patient, who comes today for a follow-up evaluation to review the test results and decide on a treatment plan. He has Lipoma of neck; Allergic rhinitis; Anemia, unspecified; Asthma; Cataract; Cortical senile cataract; GERD (gastroesophageal reflux disease); Health care maintenance; Hyperlipidemia, unspecified; Hypertension; Mass of subcutaneous tissue of back; Obesity, unspecified; Obstructive sleep apnea; Post-operative state; Prostate cancer ([place]); Restless leg syndrome; Scoliosis (and kyphoscoliosis), idiopathic; Lumbar central spinal stenosis (Severe:L4-5; Mild:L3-4; Moderate:L2-3); Type 2 diabetes mellitus with stage 2 chronic kidney disease ([place]); Chronic pain syndrome; Long term (current) use of opiate analgesic; Long term prescription opiate use; Opiate use (30 MME/Day); Chronic low back pain (Secondary source of pain) (Bilateral) (L>R); Chronic lower extremity pain (Tertiary source of pain) (Bilateral) (L>R); Abnormal MRI, lumbar spine; Abnormal MRI, cervical spine; Lumbar spinal stenosis with neurogenic claudication; Disturbance of skin sensation; Chronic hip pain (Primary Source of Pain) (Left); Chronic knee pain (Bilateral) (L>R); Generalized weakness; Muscle weakness (generalized); Chronic Lumbar radicular pain (Left) (L5); Neurogenic pain; Osteoarthritis; and Lumbar foraminal stenosis (Right: L4-5 and  L5-S1) (Left: L2-3, L3-4, and L4-5) on his problem list. His primarily concern today is the Back Pain (low); Leg Pain (left); and Knee Pain (bilateral)  Pain Assessment: Location: Lower Back Radiating: left leg and knees Onset: More than a month ago Duration: Chronic pain Quality: Aching Severity: 0-No pain/10 (self-reported pain score)  Note: Reported level is compatible with observation.                   Effect on ADL:   Timing: Intermittent Modifying factors: rest  Joshua Humphrey comes in today for a follow-up visit after his initial evaluation on 09/08/2016. Today we went over the results of his tests. These were explained in "Layman's terms". During today's appointment we went over my diagnostic impression, as well as the proposed treatment plan.  The patient comes into the clinic in a wheelchair but he indicates that it is due to the fact that he cannot walk long distances. According to the patient is primary area of pain is that of the left hip. He denies any prior surgeries but he does admit to having had a joint injection approximately 1 year ago at an Optometrist in [place]. He indicates that they have a pain specialist by the name of Dr. Julien Girt did to injection. He also indicates having had physical therapy approximately 8 months ago which consisted of 2 visits per week 3-4 months. He indicates that it did help with his ability to walk and his balance. He also indicates having had some x-rays of the hip done at Dr. Clydell Hakim practice.  The patient's secondary area of pain is that of the lower back and buttocks with the lower back pain being in the midline spreading bilaterally. He describes the left side to be worse than the right. He denies any surgeries, nerve blocks, but does indicate having had 3 injections in the back  by a pain specialist in [place] binder name of Dr. Davis Gourd. He indicates that she worked at an [employer] and that the injections were done under  fluoroscopic guidance, but he is unable to tell me what type of injection he had. In any case he describes that they seem to have helped for a short period time. This took place approximately 2 years ago. He also indicates having had some injections done for his buttocks pain at [facility] by Dr. Bland Span.  The patient's third area of pain is described to be that of the lower extremities with the left being worse than the right. In the case of the left lower extremity the pain goes all the way down into the top of his foot following what appears to be an L5 dermatomal distribution. The pain is primarily in the area of the upper thigh. The pain is described to run down the posterior aspect of the leg and he denies any surgeries, nerve blocks, joint injections, physical therapy, or x-rays of the leg. In the case of the right lower extremity he describes the pain to stop at the level of the knee and to run through the posterior aspect of the leg. This type of pain appears to be more "referred".  The next area of pain is described to be that of the knees with the left being worse than the right. Once again, the patient denies any surgeries, but he admits to having had some joint injections into the knees, bilaterally, with the last one having been approximately 8 months ago by Elvera Maria PA-C at Dr. Clydell Hakim practice. He indicates that the injections did not seem to help. He had a series of 3. He denies any physical therapy or x-rays except for those done at the [facility].  The patient indicates that he has been treating his pain for the past year using Tylenol and gabapentin. He indicates taking gabapentin 300 mg 1 tablet by mouth 3 times a day which does seem to help. He indicates that he started taking that when he had the shingles approximately 3-4 years ago in the left anterior thoracic area. He denies having any postherpetic neuralgia. However, he indicates that the gabapentin continues to give  him some relief of his other pains. He also indicates that the meloxicam use to help his pain but this was discontinued by his primary care physician due to proteinuria.  It is significant to note that the patient describes having problems with balance for which he has to use a walker at home and also experiencing recurrent intermittent episodes of generalized weakness that have caused him to be unable to stand. He denies having had any workups for neuromuscular diseases.  He indicates being here due to a request that he posed to his primary care physician to prescribe tramadol for him. Apparently he was temporarily admitted to "[facility]" due to the weakness spells and while he was there he received some tramadol that seemed to help his pain.  Today I took the time to provide the patient with information regarding my pain practice. The patient was informed that my practice is divided into two sections: an interventional pain management section, as well as a completely separate and distinct medication management section. I explained that I have procedure days for my interventional therapies, and evaluation days for follow-ups and medication management. Because of the amount of documentation required during both, they are kept separated. This means that there is the possibility  that he may be scheduled for a procedure on one day, and medication management the next. I have also informed him that because of staffing and facility limitations, I no longer take patients for medication management only. To illustrate the reasons for this, I gave the patient the example of surgeons, and how inappropriate it would be to refer a patient to his/her care, just to write for the post-surgical antibiotics on a surgery done by a different surgeon.   In considering the treatment plan options, Mr. Levinson was reminded that I no longer take patients for medication management only. I asked him to let me know if he  had no intention of taking advantage of the interventional therapies, so that we could make arrangements to provide this space to someone interested. I also made it clear that undergoing interventional therapies for the purpose of getting pain medications is very inappropriate on the part of a patient, and it will not be tolerated in this practice. This type of behavior would suggest true addiction and therefore it requires referral to an addiction specialist.   Further details on both, my assessment(s), as well as the proposed treatment plan, please see below.  Controlled Substance Pharmacotherapy Assessment REMS (Risk Evaluation and Mitigation Strategy)  Analgesic: Tramadol 50 mg 1 tablet by mouth every 4 hours (6 tab(s)/day) (300 mg/day of tramadol) (30 MME/Day) Highest recorded MME/day: 40 mg/day MME/day: 30 mg/day Pill Count: None expected due to no prior prescriptions written by our practice. Hart Rochester, RN  10/05/2016 11:20 AM  Sign at close encounter Safety precautions to be maintained throughout the outpatient stay will include: orient to surroundings, keep bed in low position, maintain call bell within reach at all times, provide assistance with transfer out of bed and ambulation.    Pharmacokinetics: Liberation and absorption (onset of action): WNL Distribution (time to peak effect): WNL Metabolism and excretion (duration of action): WNL         Pharmacodynamics: Desired effects: Analgesia: Mr. Tousley reports >50% benefit. Functional ability: Patient reports that medication allows him to accomplish basic ADLs Clinically meaningful improvement in function (CMIF): Sustained CMIF goals met Perceived effectiveness: Described as relatively effective, allowing for increase in activities of daily living (ADL) Undesirable effects: Side-effects or Adverse reactions: None reported Monitoring: [place] PMP: Online review of the past 87-monthperiod previously conducted. Not applicable at  this point since we have not taken over the patient's medication management yet. List of all Serum Drug Screening Test(s):  No results found for: AMPHSCRSER, BARBSCRSER, BENZOSCRSER, COCAINSCRSER, PCPSCRSER, THCSCRSER, OPIATESCRSER, OPollock PFranklin ParkList of all UDS test(s) done:  Lab Results  Component Value Date   SUMMARY FINAL 09/08/2016   Last UDS on record: Summary  Date Value Ref Range Status  09/08/2016 FINAL  Final    Comment:    ==================================================================== TOXASSURE COMP DRUG ANALYSIS,UR ==================================================================== Test                             Result       Flag       Units Drug Present and Declared for Prescription Verification   Gabapentin                     PRESENT      EXPECTED   Acetaminophen                  PRESENT      EXPECTED Drug Absent but  Declared for Prescription Verification   Hydrocodone                    Not Detected UNEXPECTED ng/mg creat   Tramadol                       Not Detected UNEXPECTED   Salicylate                     Not Detected UNEXPECTED    Aspirin, as indicated in the declared medication list, is not    always detected even when used as directed. ==================================================================== Test                      Result    Flag   Units      Ref Range   Creatinine              23               mg/dL      >=20 ==================================================================== Declared Medications:  The flagging and interpretation on this report are based on the  following declared medications.  Unexpected results may arise from  inaccuracies in the declared medications.  **Note: The testing scope of this panel includes these medications:  Gabapentin  Hydrocodone (Norco)  Tramadol (Ultram)  **Note: The testing scope of this panel does not include small to  moderate amounts of these reported medications:  Acetaminophen  (Norco)  Acetaminophen (Tylenol)  Aspirin (Aspirin 81)  **Note: The testing scope of this panel does not include following  reported medications:  Albuterol (Combivent)  Atorvastatin (Lipitor)  Calcium Carbonate (Calcium carbonate/Vitamin D)  Carvedilol (Coreg)  Cephalexin (Keflex)  Cetirizine (Zyrtec)  Chondroitin (Glucosamine-Chondroitin)  Ezetimibe (Vytorin)  Fluticasone (Flonase)  Formoterol (Dulera)  Furosemide (Lasix)  Glucosamine (Glucosamine-Chondroitin)  Hydralazine  Ipratropium (Combivent)  Iron  Leuprolide Acetate (Lupron)  Losartan (Cozaar)  Meloxicam (Mobic)  Metformin (Glucophage)  Mometasone (Dulera)  Montelukast (Singulair)  Multivitamin  Nifedipine  Omeprazole  Potassium  Pramipexole (Mirapex)  Simvastatin (Vytorin)  Sitagliptin (Januvia)  Supplement  Vitamin D (Calcium carbonate/Vitamin D) ==================================================================== For clinical consultation, please call [phone]. ====================================================================    UDS interpretation: Unexpected findings not considered significantly abnormal          Medication Assessment Form: Patient introduced to form today Treatment compliance: Treatment may start today if patient agrees with proposed plan. Evaluation of compliance is not applicable at this point Risk Assessment Profile: Aberrant behavior: See initial evaluations. None observed or detected today Comorbid factors increasing risk of overdose: See initial evaluation. No additional risks detected today Risk Mitigation Strategies:  Patient opioid safety counseling: Completed today. Counseling provided to patient as per "Patient Counseling Document". Document signed by patient, attesting to counseling and understanding Patient-Prescriber Agreement (PPA): Obtained today.  Controlled substance notification to other providers: Written and sent today.  Pharmacologic Plan: Today we may be  taking over the patient's pharmacological regimen. See below             Laboratory Chemistry  Inflammation Markers (CRP: Acute Phase) (ESR: Chronic Phase) Lab Results  Component Value Date   CRP 0.7 09/08/2016   ESRSEDRATE 16 09/08/2016                 Renal Function Markers Lab Results  Component Value Date   BUN 20 09/08/2016   CREATININE 0.75 (L) 09/08/2016   GFRAA 97 09/08/2016   GFRNONAA 84 09/08/2016  Hepatic Function Markers Lab Results  Component Value Date   AST 17 09/08/2016   ALT 15 09/08/2016   ALBUMIN 4.2 09/08/2016   ALKPHOS 64 09/08/2016                 Electrolytes Lab Results  Component Value Date   NA 144 09/08/2016   K 3.8 09/08/2016   CL 105 09/08/2016   CALCIUM 9.2 09/08/2016   MG 1.8 09/08/2016                 Neuropathy Markers Lab Results  Component Value Date   VITAMINB12 264 09/08/2016                 Bone Pathology Markers Lab Results  Component Value Date   ALKPHOS 64 09/08/2016   25OHVITD1 41 09/08/2016   25OHVITD2 <1.0 09/08/2016   25OHVITD3 41 09/08/2016   CALCIUM 9.2 09/08/2016                 Coagulation Parameters Lab Results  Component Value Date   PLT 275 01/20/2016                 Cardiovascular Markers Lab Results  Component Value Date   HGB 12.1 (L) 01/20/2016   HCT 36.3 (L) 01/20/2016                 Note: Lab results reviewed and explained to patient in Layman's terms.  Recent Diagnostic Imaging Review  Mr Cervical Spine Wo Contrast Result Date: 03/18/2016 CLINICAL DATA:  Subcutaneous mass in the lower neck/upper back which has returned after remote surgical removal. EXAM: MRI CERVICAL SPINE WITHOUT CONTRAST TECHNIQUE: Multiplanar, multisequence MR imaging of the cervical spine was performed. No intravenous contrast was administered. COMPARISON:  None. FINDINGS: Sagittal and axial sequences were adjusted caudally compared to a standard cervical spine MRI to completely cover the  subcutaneous mass. The skullbase and C1-2 are incompletely imaged on sagittal sequences, and axial imaging extends from the C4-T3 levels. Alignment: Cervical spine straightening.  No listhesis. Vertebrae: Preserve vertebral body heights without evidence of fracture or suspicious osseous lesion. Mild multilevel type 2 degenerative endplate changes. Cord: Normal signal. Posterior Fossa, vertebral arteries, paraspinal tissues: A large subcutaneous mass is present in the posterior neck extending from the C4-T2 levels. This measures 13.9 x 6.3 x 10.9 cm (transverse x AP x craniocaudal) and is of homogeneous fat signal aside from scattered internal vessels and thin internal septations. Disc levels: C2-3: Only imaged sagittally. Mild facet arthrosis without significant stenosis. C3-4: Only imaged sagittally. Moderate disc space narrowing. Uncovertebral spurring and facet arthrosis with ankylosis. No significant spinal stenosis. C4-5: Moderate disc space narrowing. Disc bulging, right paracentral disc protrusion, uncovertebral spurring, and moderate facet arthrosis result in moderate spinal stenosis with mild cord flattening and moderate bilateral neural foraminal stenosis. C5-6: Mild disc space narrowing. Mild disc bulging and uncovertebral spurring result in mild bilateral neural foraminal stenosis without spinal stenosis. C6-7: Moderate disc space narrowing. Uncovertebral spurring results in mild right and moderate left neural foraminal stenosis. No spinal stenosis. C7-T1: Moderate disc space narrowing. Disc bulging and left greater than right uncovertebral spurring result in mild right and severe left neural foraminal stenosis without significant spinal stenosis. No disc herniation or stenosis in the visualized upper thoracic spine. IMPRESSION: 1. 14 cm subcutaneous lipoma in the posterior lower neck. 2. Moderate cervical disc degeneration, most notable at C4-5 where there is moderate spinal and bilateral neural  foraminal stenosis. Electronically Signed  By: Logan Bores M.D.   On: 03/18/2016 16:39   Cervical Imaging: Cervical MR wo contrast:  Results for orders placed during the hospital encounter of 03/18/16  MR CERVICAL SPINE WO CONTRAST   Narrative CLINICAL DATA:  Subcutaneous mass in the lower neck/upper back which has returned after remote surgical removal.  EXAM: MRI CERVICAL SPINE WITHOUT CONTRAST  TECHNIQUE: Multiplanar, multisequence MR imaging of the cervical spine was performed. No intravenous contrast was administered.  COMPARISON:  None.  FINDINGS: Sagittal and axial sequences were adjusted caudally compared to a standard cervical spine MRI to completely cover the subcutaneous mass. The skullbase and C1-2 are incompletely imaged on sagittal sequences, and axial imaging extends from the C4-T3 levels.  Alignment: Cervical spine straightening.  No listhesis.  Vertebrae: Preserve vertebral body heights without evidence of fracture or suspicious osseous lesion. Mild multilevel type 2 degenerative endplate changes.  Cord: Normal signal.  Posterior Fossa, vertebral arteries, paraspinal tissues: A large subcutaneous mass is present in the posterior neck extending from the C4-T2 levels. This measures 13.9 x 6.3 x 10.9 cm (transverse x AP x craniocaudal) and is of homogeneous fat signal aside from scattered internal vessels and thin internal septations.  Disc levels:  C2-3: Only imaged sagittally. Mild facet arthrosis without significant stenosis.  C3-4: Only imaged sagittally. Moderate disc space narrowing. Uncovertebral spurring and facet arthrosis with ankylosis. No significant spinal stenosis.  C4-5: Moderate disc space narrowing. Disc bulging, right paracentral disc protrusion, uncovertebral spurring, and moderate facet arthrosis result in moderate spinal stenosis with mild cord flattening and moderate bilateral neural foraminal stenosis.  C5-6: Mild disc space  narrowing. Mild disc bulging and uncovertebral spurring result in mild bilateral neural foraminal stenosis without spinal stenosis.  C6-7: Moderate disc space narrowing. Uncovertebral spurring results in mild right and moderate left neural foraminal stenosis. No spinal stenosis.  C7-T1: Moderate disc space narrowing. Disc bulging and left greater than right uncovertebral spurring result in mild right and severe left neural foraminal stenosis without significant spinal stenosis.  No disc herniation or stenosis in the visualized upper thoracic spine.  IMPRESSION: 1. 14 cm subcutaneous lipoma in the posterior lower neck. 2. Moderate cervical disc degeneration, most notable at C4-5 where there is moderate spinal and bilateral neural foraminal stenosis.   Electronically Signed   By: Logan Bores M.D.   On: 03/18/2016 16:39    Lumbosacral Imaging: Lumbar MR wo contrast:  Results for orders placed during the hospital encounter of 07/30/15  MR Lumbar Spine Wo Contrast   Narrative CLINICAL DATA:  Low back pain with bilateral buttock and leg pain. Lumbar spinal stenosis.  EXAM: MRI LUMBAR SPINE WITHOUT CONTRAST  TECHNIQUE: Multiplanar, multisequence MR imaging of the lumbar spine was performed. No intravenous contrast was administered.  COMPARISON:  Lumbar radiographs 07/04/2015  FINDINGS: Segmentation:  Normal segmentation.  Moderate dextroscoliosis at L3.  Alignment:  Normal alignment  Vertebrae: Negative for fracture or mass lesion. Bone marrow signal normal.  Conus medullaris: Extends to the L1-2 level and appears normal.  Paraspinal and other soft tissues: 15 mm left renal cyst. No retroperitoneal mass. Paraspinous muscles show symmetric atrophy.  Disc levels:  L1-2:  Mild disc and facet degeneration with mild spinal stenosis  L2-3: Advanced disc degeneration on the left with disc space narrowing and endplate osteophyte formation. Bilateral facet hypertrophy.  Left foraminal encroachment due to bony overgrowth. Moderate spinal stenosis.  L3-4: Advanced disc degeneration on the left with disc space narrowing and endplate spurring. Moderate left foraminal  encroachment. Mild spinal stenosis. Mild right foraminal narrowing.  L4-5: Diffuse disc bulging with central disc protrusion. Advanced facet hypertrophy bilaterally. Marked left foraminal encroachment and moderate to severe right foraminal encroachment with impingement of the L4 nerve root bilaterally. Severe spinal stenosis. Subarticular stenosis right greater than left.  L5-S1: Disc degeneration and spurring right greater than left. Moderate right foraminal encroachment with impingement of the right L5 nerve root. Left foramen patent.  IMPRESSION: Moderate dextroscoliosis.  Multilevel degenerative change.  L2-3 moderate spinal stenosis with left foraminal encroachment  Mild spinal stenosis L3-4 with moderate left foraminal encroachment and mild right foraminal encroachment  Severe spinal stenosis L4-5. Marked left foraminal encroachment and moderate to severe right foraminal encroachment.  Right foraminal encroachment L5-S1 due to bony overgrowth.   Electronically Signed   By: Franchot Gallo M.D.   On: 07/30/2015 15:25    Note: Results of ordered imaging test(s) reviewed and explained to patient in Layman's terms. Copy of results provided to patient  Meds   Current Meds  Medication Sig  . acetaminophen (TYLENOL) 650 MG CR tablet Take 650-1,300 mg by mouth every 8 (eight) hours as needed (for pain.).   Marland Kitchen aspirin EC 81 MG tablet Take 81 mg by mouth daily.   Marland Kitchen atorvastatin (LIPITOR) 40 MG tablet Take 40 mg by mouth daily.   . beta carotene w/minerals (OCUVITE) tablet Take 1 tablet by mouth every evening.   . carvedilol (COREG) 25 MG tablet TAKE 1 TABLET (25 MG) TWICE DAILY  . cetirizine (ZYRTEC) 10 MG tablet Take 10 mg by mouth every evening.   . ferrous fumarate (HEMOCYTE -  106 MG FE) 325 (106 FE) MG TABS tablet Take 1 tablet by mouth daily.  Marland Kitchen FLAXSEED, LINSEED, PO Take by mouth daily.  . fluticasone (FLONASE) 50 MCG/ACT nasal spray Place 1-2 sprays into both nostrils daily as needed (for allergies.).   Marland Kitchen furosemide (LASIX) 40 MG tablet TAKE 1 TABLET BY MOUTH TWICE DAILY  . gabapentin (NEURONTIN) 300 MG capsule Take 1-3 capsules (300-900 mg total) by mouth 4 (four) times daily. Follow titration schedule.  . Glucosamine-Chondroit-Vit C-Mn (GLUCOSAMINE-CHONDROITIN) CAPS Take 2 capsules by mouth daily.  . hydrALAZINE (APRESOLINE) 50 MG tablet Take 50 mg by mouth 3 (three) times daily.   . Ipratropium-Albuterol (COMBIVENT RESPIMAT) 20-100 MCG/ACT AERS respimat INHALE 2 PUFF FOUR TIMES DAILY AS NEEDED FOR SHORTNESS OF BREATH AND/OR WHEEZING.  Ernest Mallick FLEXTOUCH 100 UNIT/ML Pen Inject 15 Units into the skin every evening.   Marland Kitchen losartan (COZAAR) 100 MG tablet Take 100 mg by mouth daily.   . metFORMIN (GLUCOPHAGE) 850 MG tablet Take 850 mg by mouth 2 (two) times daily.   . mometasone-formoterol (DULERA) 200-5 MCG/ACT AERO Inhale 2 puffs into the lungs 2 (two) times daily.   . montelukast (SINGULAIR) 10 MG tablet Take 10 mg by mouth at bedtime.  . Multiple Vitamins-Minerals (OCUVITE ADULT 50+) CAPS Take 1 capsule by mouth daily.   Marland Kitchen NIFEdipine (PROCARDIA XL/ADALAT-CC) 90 MG 24 hr tablet Take 90 mg by mouth daily.   Marland Kitchen omeprazole (PRILOSEC) 20 MG capsule Take 20 mg by mouth daily.  . potassium chloride SA (K-DUR,KLOR-CON) 20 MEQ tablet Take 20 mEq by mouth 2 (two) times daily.   . pramipexole (MIRAPEX) 0.25 MG tablet Take 0.25 mg by mouth at bedtime as needed (for restless leg syndrome).   . sitaGLIPtin (JANUVIA) 100 MG tablet Take 100 mg by mouth daily.  . [DISCONTINUED] gabapentin (NEURONTIN) 300 MG capsule TAKE 1 CAPSULE (  300 MG) BY MOUTH THREE TIMES DAILY    ROS  Constitutional: Denies any fever or chills Gastrointestinal: No reported hemesis, hematochezia, vomiting,  or acute GI distress Musculoskeletal: Denies any acute onset joint swelling, redness, loss of ROM, or weakness Neurological: No reported episodes of acute onset apraxia, aphasia, dysarthria, agnosia, amnesia, paralysis, loss of coordination, or loss of consciousness  Allergies  Mr. Allbritton has No Known Allergies.  [place]  Drug: Mr. Vanwieren  reports that he does not use drugs. Alcohol:  reports that he drinks alcohol. Tobacco:  reports that he has never smoked. He has never used smokeless tobacco. Medical:  has a past medical history of Anemia; Asthma; Cancer ([place]); Cancer ([place]); Diabetes mellitus without complication ([place]); GERD (gastroesophageal reflux disease); Hypertension; PONV (postoperative nausea and vomiting); Septic olecranon bursitis of left elbow (01/22/2016); and Sleep apnea. Surgical: Mr. Domanski  has a past surgical history that includes tumor removed (2000); Prostate surgery (2002); lipoma removal (2000); Tonsillectomy; Cataract extraction, bilateral; Toe Surgery; Olecranon bursectomy (Left, 01/21/2016); Elbow surgery; and Eye surgery. Family: family history includes Alcohol abuse in his father; Cancer in his brother and mother.  Constitutional Exam  General appearance: Well nourished, well developed, and well hydrated. In no apparent acute distress Vitals:   10/05/16 1111  BP: (!) 151/68  Pulse: 63  Temp: 97.8 F (36.6 C)  TempSrc: Oral  SpO2: 96%  Weight: 220 lb (99.8 kg)  Height: 5' 9"  (1.753 m)   BMI Assessment: Estimated body mass index is 32.49 kg/m as calculated from the following:   Height as of this encounter: 5' 9"  (1.753 m).   Weight as of this encounter: 220 lb (99.8 kg).  BMI interpretation table: BMI level Category Range association with higher incidence of chronic pain  <18 kg/m2 Underweight   18.5-24.9 kg/m2 Ideal body weight   25-29.9 kg/m2 Overweight Increased incidence by 20%  30-34.9 kg/m2 Obese (Class I) Increased incidence by 68%  35-39.9 kg/m2  Severe obesity (Class II) Increased incidence by 136%  >40 kg/m2 Extreme obesity (Class III) Increased incidence by 254%   BMI Readings from Last 4 Encounters:  10/08/16 32.49 kg/m  10/05/16 32.49 kg/m  09/08/16 32.78 kg/m  01/21/16 31.45 kg/m   Wt Readings from Last 4 Encounters:  10/08/16 220 lb (99.8 kg)  10/05/16 220 lb (99.8 kg)  09/08/16 222 lb (100.7 kg)  01/21/16 213 lb (96.6 kg)  Psych/Mental status: Alert, oriented x 3 (person, place, & time)       Eyes: PERLA Respiratory: No evidence of acute respiratory distress  Cervical Spine Exam  Inspection: No masses, redness, or swelling Alignment: Symmetrical Functional ROM: Unrestricted ROM      Stability: No instability detected Muscle strength & Tone: Functionally intact Sensory: Unimpaired Palpation: No palpable anomalies              Upper Extremity (UE) Exam    Side: Right upper extremity  Side: Left upper extremity  Inspection: No masses, redness, swelling, or asymmetry. No contractures  Inspection: No masses, redness, swelling, or asymmetry. No contractures  Functional ROM: Unrestricted ROM          Functional ROM: Unrestricted ROM          Muscle strength & Tone: Functionally intact  Muscle strength & Tone: Functionally intact  Sensory: Unimpaired  Sensory: Unimpaired  Palpation: No palpable anomalies              Palpation: No palpable anomalies  Specialized Test(s): Deferred         Specialized Test(s): Deferred          Thoracic Spine Exam  Inspection: No masses, redness, or swelling Alignment: Symmetrical Functional ROM: Unrestricted ROM Stability: No instability detected Sensory: Unimpaired Muscle strength & Tone: No palpable anomalies  Lumbar Spine Exam  Inspection: No masses, redness, or swelling Alignment: Symmetrical Functional ROM: Minimal ROM      Stability: No instability detected Muscle strength & Tone: Functionally intact Sensory: Movement-associated pain Palpation: Complains  of area being tender to palpation       Provocative Tests: Lumbar Hyperextension and rotation test: evaluation deferred today       Lumbar Lateral bending test: evaluation deferred today       Patrick's Maneuver: evaluation deferred today                    Gait & Posture Assessment  Ambulation: Patient came in today in a wheel chair Gait: Very limited, using assistive device to ambulate Posture: Antalgic   Lower Extremity Exam    Side: Right lower extremity  Side: Left lower extremity  Inspection: No masses, redness, swelling, or asymmetry. No contractures  Inspection: No masses, redness, swelling, or asymmetry. No contractures  Functional ROM: Unrestricted ROM          Functional ROM: Unrestricted ROM          Muscle strength & Tone: Moderate-to-severe deconditioning  Muscle strength & Tone: Moderate-to-severe deconditioning  Sensory: Unimpaired  Sensory: Unimpaired  Palpation: No palpable anomalies  Palpation: No palpable anomalies   Assessment & Plan  Primary Diagnosis & Pertinent Problem List: The primary encounter diagnosis was Chronic hip pain (Location of Primary Source of Pain) (Left). Diagnoses of Chronic low back pain (Location of Secondary source of pain) (Bilateral) (L>R), Chronic lower extremity pain (Location of Tertiary source of pain) (Bilateral) (L>R), Chronic Lumbar radicular pain (Left) (L5), Lumbar foraminal stenosis (Right: L4-5 and L5-S1) (Left: L2-3, L3-4, and L4-5), Spinal stenosis, lumbar region, with neurogenic claudication, Spinal stenosis of lumbosacral region, Muscle weakness (generalized), Neurogenic pain, Osteoarthritis, and Abnormal MRI, lumbar spine were also pertinent to this visit.  Visit Diagnosis: 1. Chronic hip pain (Location of Primary Source of Pain) (Left)   2. Chronic low back pain (Location of Secondary source of pain) (Bilateral) (L>R)   3. Chronic lower extremity pain (Location of Tertiary source of pain) (Bilateral) (L>R)   4. Chronic Lumbar  radicular pain (Left) (L5)   5. Lumbar foraminal stenosis (Right: L4-5 and L5-S1) (Left: L2-3, L3-4, and L4-5)   6. Spinal stenosis, lumbar region, with neurogenic claudication   7. Spinal stenosis of lumbosacral region   8. Muscle weakness (generalized)   9. Neurogenic pain   10. Osteoarthritis   11. Abnormal MRI, lumbar spine    Problems updated and reviewed during this visit: Problem  Osteoarthritis  Lumbar foraminal stenosis (Right: L4-5 and L5-S1) (Left: L2-3, L3-4, and L4-5)  Chronic hip pain (Primary Source of Pain) (Left)  Chronic low back pain (Secondary source of pain) (Bilateral) (L>R)  Chronic lower extremity pain (Tertiary source of pain) (Bilateral) (L>R)  Lumbar spinal stenosis with neurogenic claudication  Scoliosis (And Kyphoscoliosis), Idiopathic  Lumbar central spinal stenosis (Severe:L4-5; Mild:L3-4; Moderate:L2-3)  Opiate use (30 MME/Day)  Septic Olecranon Bursitis of Left Elbow (Resolved)    Plan of Care  Pharmacotherapy (Medications Ordered): Meds ordered this encounter  Medications  . gabapentin (NEURONTIN) 300 MG capsule    Sig: Take 1-3  capsules (300-900 mg total) by mouth 4 (four) times daily. Follow titration schedule.    Dispense:  360 capsule    Refill:  2    Do not place this medication, or any other prescription from our practice, on "Automatic Refill". Patient may have prescription filled one day early if pharmacy is closed on scheduled refill date.   Lab-work, procedure(s), and/or referral(s): Orders Placed This Encounter  Procedures  . HIP INJECTION  . Lumbar Epidural Injection  . Lumbar Transforaminal Epidural  . NCV with EMG(electromyography)    Pharmacological management options:  Opioid Analgesics: We'll take over management today. See above orders Membrane stabilizer: We have discussed the possibility of optimizing this mode of therapy, if tolerated Muscle relaxant: We have discussed the possibility of a trial NSAID: We have  discussed the possibility of a trial Other analgesic(s): To be determined at a later time   Interventional management options: Planned, scheduled, and/or pending:    EMG/PNCV nerve conduction test  May need surgical decompression   Considering:   Diagnostic left intra-articular hip joint injection  Possible diagnostic left femoral nerve + obturator nerve block  Possible left femoral nerve + obturator nerve RFA  Diagnostic left L2-3 interlaminar lumbar epidural steroid injection Diagnostic left L3-4 interlaminar lumbar epidural steroid injection Diagnostic left L4-5 interlaminar lumbar epidural steroid injection  Diagnostic left L2-3, L3-4 and L4-5 transforaminal epidural steroid injection  Diagnostic right L4-5 and L5-S1 transforaminal epidural steroid injection  Diagnostic bilateral lumbar facet block  Possible bilateral lumbar facet RFA  Diagnostic bilateral intra-articular knee joint injection with local anesthetic and steroids  Possible series of 5 bilateral intra-articular Hyalgan knee injections Diagnostic bilateral genicular nerve block  Possible bilateral genicular nerve RFA    PRN Procedures:   Diagnostic left intra-articular hip joint injection under fluoroscopic guidance and IV sedation  Diagnostic left L2-3 interlaminar lumbar epidural steroid injection Diagnostic left L3-4 interlaminar lumbar epidural steroid injection Diagnostic left L4-5 interlaminar lumbar epidural steroid injection  Diagnostic left L2-3, L3-4 and L4-5 transforaminal epidural steroid injection  Diagnostic right L4-5 and L5-S1 transforaminal epidural steroid injection    Provider-requested follow-up: Return in about 3 months (around 01/05/2017) for Med-Mgmt by Dr. Dossie Arbour.  Future Appointments Date Time Provider [place]  10/15/2016 10:00 AM Milinda Pointer, MD [facility] None  01/04/2017 1:00 PM Milinda Pointer, MD [facility] None  04/06/2017 2:45 PM [facility] None  04/08/2017  1:45 PM [place] None    Primary Care Physician: Kirk Ruths, MD Location: [facility] Note by: Gaspar Cola, MD Date: 10/05/2016; Time: 12:07 PM  Patient Instructions   ____________________________________________________________________________________________  Preparing for Procedure with Sedation Instructions: . Oral Intake: Do not eat or drink anything for at least 8 hours prior to your procedure. . Transportation: Public transportation is not allowed. Bring an adult driver. The driver must be physically present in our waiting room before any procedure can be started. Marland Kitchen Physical Assistance: Bring an adult physically capable of assisting you, in the event you need help. This adult should keep you company at home for at least 6 hours after the procedure. . Blood Pressure Medicine: Take your blood pressure medicine with a sip of water the morning of the procedure. . Blood thinners:  . Diabetics on insulin: Notify the staff so that you can be scheduled 1st case in the morning. If your diabetes requires high dose insulin, take only  of your normal insulin dose the morning of the procedure and notify  the staff that you have done so. . Preventing infections: Shower with an antibacterial soap the morning of your procedure. . Build-up your immune system: Take 1000 mg of Vitamin C with every meal (3 times a day) the day prior to your procedure. Marland Kitchen Antibiotics: Inform the staff if you have a condition or reason that requires you to take antibiotics before dental procedures. . Pregnancy: If you are pregnant, call and cancel the procedure. . Sickness: If you have a cold, fever, or any active infections, call and cancel the procedure. . Arrival: You must be in the facility at least 30 minutes prior to your scheduled procedure. . Children: Do not bring children with you. . Dress appropriately: Bring dark clothing that you would  not mind if they get stained. . Valuables: Do not bring any jewelry or valuables. Procedure appointments are reserved for interventional treatments only. Marland Kitchen No Prescription Refills. . No medication changes will be discussed during procedure appointments. . No disability issues will be discussed. ____________________________________________________________________________________________  ____________________________________________________________________________________________  Gabapentin Titration  Medication used: Gabapentin (Generic Name) or Neurontin (Brand Name) 300 mg tablets/capsules  Reasons to stop increasing the dose:  Reason 1: You get good relief of symptoms, in which case there is no need to increase the daily dose any further.    Reason 2: You develop some side effects, such as sleeping all of the time, difficulty concentrating, or becoming disoriented, in which case you need to go down on the dose, to the prior level, where you were not experiencing any side effects. Stay on that dose longer, to allow more time for your body to get use it, before attempting to increase it again.   Reasons to stop increasing the dose: Reason 1: You get good relief of symptoms, in which case there is no need to increase the daily dose any further.  Reason 2: You develop some side effects, such as sleeping all of the time, difficulty concentrating, or becoming disoriented, in which case you need to go down on the dose, to the prior level, where you were not experiencing any side effects. Stay on that dose longer, to allow more time for your body to get use it, before attempting to increase it again.  Steps to increase medication: Step 1: Start by taking 1 (one) tablet at bedtime x 7 (seven) days.  Step 2: After 7 (seven) days of taking 1 (one) tablet at bedtime, increase it to 2 (two) tablets at bedtime. Stay on this dose x 7 (seven) days.  Step 3: After 7 (seven) days of taking 2 (two)  tablets at bedtime, increase it to 3 (three) tablets at bedtime. Stay on this dose x another 7 (seven) days.  Step 4: After 7 (seven) days of taking 3 (three) tablet at bedtime, begin taking 1 (one) tablet at noon with lunch. Stay on this dose x another 7 (seven) days.  Step 5: After 7 (seven) days of taking 3 (three) tablet at bedtime, and 1 (one) tablet at noon, then begin taking 1 (one) tablet in the afternoon with dinner. Stay on this dose x another 7 (seven) days.  Step 6: After 7 (seven) days of taking 3 (three) tablet at bedtime, 1 (one) tablet at noon, and 1 (one) tablet in the afternoon, then begin taking 1 (one) tablet in the morning with breakfast. Stay on this dose x another 7 (seven) days. At this point you should be taking the medicine 4 (four) times a day, or about every 6 (  six) hours. This daily regimen of taking the medicine 4 (four) times a day, will be maintained from now on. You should not take any doses any sooner than every 6 (six) hours.  Step 7: After 7 (seven) days of taking 3 (three) tablet at bedtime, 1 (one) tablet at noon, 1 (one) tablet in the afternoon, and 1 (one) tablet in the morning, begin taking 2 (two) tablets at noon with lunch. Stay on this dose x another 7 (seven) days.   Step 8: After 7 (seven) days of taking 3 (three) tablet at bedtime, 2 (two) tablets at noon, 1 (one) tablet in the afternoon, and 1 (one) tablet in the morning, begin taking 2 (two) tablets in the afternoon with dinner. Stay on this dose x another 7 (seven) days.   Step 9: After 7 (seven) days of taking 3 (three) tablet at bedtime, 2 (two) tablets at noon, 2 (two) tablets in the afternoon, and 1 (one) tablet in the morning, begin taking 2 (two) tablets in the morning with breakfast. Stay on this dose x another 7 (seven) days. At this point you should be taking the medicine 4 (four) times a day, or about every 6 (six) hours. This daily regimen of taking the medicine 4 (four) times a day, will be  maintained from now on. You should not take any doses any sooner than every 6 (six) hours.  Step 10: After 7 (seven) days of taking 3 (three) tablet at bedtime, 2 (two) tablets at noon, 2 (two) tablets in the afternoon, and 2 (two) tablets in the morning, begin taking 3 (three) tablets at noon with lunch. Stay on this dose x another 7 (seven) days.   Step 11: After 7 (seven) days of taking 3 (three) tablet at bedtime, 3 (three) tablets at noon, 2 (two) tablets in the afternoon, and 2 (two) tablets in the morning, begin taking 3 (three) tablets in the afternoon with dinner. Stay on this dose x another 7 (seven) days.   Step 12: After 7 (seven) days of taking 3 (three) tablet at bedtime, 3 (three) tablets at noon, 3 (three) tablets in the afternoon, and 2 (two) tablet in the morning, begin taking 3 (three) tablets in the morning with breakfast. Stay on this dose x another 7 (seven) days. At this point you should be taking the medicine 4 (four) times a day, or about every 6 (six) hours. This daily regimen of taking the medicine 4 (four) times a day, will be maintained from now on.   Endpoint: Once you have reached the maximum dose you can tolerate without side-effects, contact your physician so as to evaluate the results of the regimen.   Questions: Feel free to contact us for any questions or problems at [phone]  A prescription for Gabapentin was sent to your pharmacy. ____________________________________________________________________________________________

## 2016-10-05 NOTE — Progress Notes (Signed)
Safety precautions to be maintained throughout the outpatient stay will include: orient to surroundings, keep bed in low position, maintain call bell within reach at all times, provide assistance with transfer out of bed and ambulation.  

## 2016-10-05 NOTE — Patient Instructions (Addendum)
____________________________________________________________________________________________  Preparing for Procedure with Sedation Instructions: . Oral Intake: Do not eat or drink anything for at least 8 hours prior to your procedure. . Transportation: Public transportation is not allowed. Bring an adult driver. The driver must be physically present in our waiting room before any procedure can be started. Marland Kitchen Physical Assistance: Bring an adult physically capable of assisting you, in the event you need help. This adult should keep you company at home for at least 6 hours after the procedure. . Blood Pressure Medicine: Take your blood pressure medicine with a sip of water the morning of the procedure. . Blood thinners:  . Diabetics on insulin: Notify the staff so that you can be scheduled 1st case in the morning. If your diabetes requires high dose insulin, take only  of your normal insulin dose the morning of the procedure and notify the staff that you have done so. . Preventing infections: Shower with an antibacterial soap the morning of your procedure. . Build-up your immune system: Take 1000 mg of Vitamin C with every meal (3 times a day) the day prior to your procedure. Marland Kitchen Antibiotics: Inform the staff if you have a condition or reason that requires you to take antibiotics before dental procedures. . Pregnancy: If you are pregnant, call and cancel the procedure. . Sickness: If you have a cold, fever, or any active infections, call and cancel the procedure. . Arrival: You must be in the facility at least 30 minutes prior to your scheduled procedure. . Children: Do not bring children with you. . Dress appropriately: Bring dark clothing that you would not mind if they get stained. . Valuables: Do not bring any jewelry or valuables. Procedure appointments are reserved for interventional treatments only. Marland Kitchen No Prescription Refills. . No medication changes will be discussed during procedure  appointments. . No disability issues will be discussed. ____________________________________________________________________________________________  ____________________________________________________________________________________________  Gabapentin Titration  Medication used: Gabapentin (Generic Name) or Neurontin (Brand Name) 300 mg tablets/capsules  Reasons to stop increasing the dose:  Reason 1: You get good relief of symptoms, in which case there is no need to increase the daily dose any further.    Reason 2: You develop some side effects, such as sleeping all of the time, difficulty concentrating, or becoming disoriented, in which case you need to go down on the dose, to the prior level, where you were not experiencing any side effects. Stay on that dose longer, to allow more time for your body to get use it, before attempting to increase it again.   Reasons to stop increasing the dose: Reason 1: You get good relief of symptoms, in which case there is no need to increase the daily dose any further.  Reason 2: You develop some side effects, such as sleeping all of the time, difficulty concentrating, or becoming disoriented, in which case you need to go down on the dose, to the prior level, where you were not experiencing any side effects. Stay on that dose longer, to allow more time for your body to get use it, before attempting to increase it again.  Steps to increase medication: Step 1: Start by taking 1 (one) tablet at bedtime x 7 (seven) days.  Step 2: After 7 (seven) days of taking 1 (one) tablet at bedtime, increase it to 2 (two) tablets at bedtime. Stay on this dose x 7 (seven) days.  Step 3: After 7 (seven) days of taking 2 (two) tablets at bedtime, increase it to 3 (three)  tablets at bedtime. Stay on this dose x another 7 (seven) days.  Step 4: After 7 (seven) days of taking 3 (three) tablet at bedtime, begin taking 1 (one) tablet at noon with lunch. Stay on this dose x  another 7 (seven) days.  Step 5: After 7 (seven) days of taking 3 (three) tablet at bedtime, and 1 (one) tablet at noon, then begin taking 1 (one) tablet in the afternoon with dinner. Stay on this dose x another 7 (seven) days.  Step 6: After 7 (seven) days of taking 3 (three) tablet at bedtime, 1 (one) tablet at noon, and 1 (one) tablet in the afternoon, then begin taking 1 (one) tablet in the morning with breakfast. Stay on this dose x another 7 (seven) days. At this point you should be taking the medicine 4 (four) times a day, or about every 6 (six) hours. This daily regimen of taking the medicine 4 (four) times a day, will be maintained from now on. You should not take any doses any sooner than every 6 (six) hours.  Step 7: After 7 (seven) days of taking 3 (three) tablet at bedtime, 1 (one) tablet at noon, 1 (one) tablet in the afternoon, and 1 (one) tablet in the morning, begin taking 2 (two) tablets at noon with lunch. Stay on this dose x another 7 (seven) days.   Step 8: After 7 (seven) days of taking 3 (three) tablet at bedtime, 2 (two) tablets at noon, 1 (one) tablet in the afternoon, and 1 (one) tablet in the morning, begin taking 2 (two) tablets in the afternoon with dinner. Stay on this dose x another 7 (seven) days.   Step 9: After 7 (seven) days of taking 3 (three) tablet at bedtime, 2 (two) tablets at noon, 2 (two) tablets in the afternoon, and 1 (one) tablet in the morning, begin taking 2 (two) tablets in the morning with breakfast. Stay on this dose x another 7 (seven) days. At this point you should be taking the medicine 4 (four) times a day, or about every 6 (six) hours. This daily regimen of taking the medicine 4 (four) times a day, will be maintained from now on. You should not take any doses any sooner than every 6 (six) hours.  Step 10: After 7 (seven) days of taking 3 (three) tablet at bedtime, 2 (two) tablets at noon, 2 (two) tablets in the afternoon, and 2 (two) tablets in the  morning, begin taking 3 (three) tablets at noon with lunch. Stay on this dose x another 7 (seven) days.   Step 11: After 7 (seven) days of taking 3 (three) tablet at bedtime, 3 (three) tablets at noon, 2 (two) tablets in the afternoon, and 2 (two) tablets in the morning, begin taking 3 (three) tablets in the afternoon with dinner. Stay on this dose x another 7 (seven) days.   Step 12: After 7 (seven) days of taking 3 (three) tablet at bedtime, 3 (three) tablets at noon, 3 (three) tablets in the afternoon, and 2 (two) tablet in the morning, begin taking 3 (three) tablets in the morning with breakfast. Stay on this dose x another 7 (seven) days. At this point you should be taking the medicine 4 (four) times a day, or about every 6 (six) hours. This daily regimen of taking the medicine 4 (four) times a day, will be maintained from now on.   Endpoint: Once you have reached the maximum dose you can tolerate without side-effects, contact your physician so as  to evaluate the results of the regimen.   Questions: Feel free to contact us for any questions or problems at [phone]  A prescription for Gabapentin was sent to your pharmacy. ____________________________________________________________________________________________

## 2016-10-07 ENCOUNTER — Other Ambulatory Visit: Payer: Medicare Other

## 2016-10-07 DIAGNOSIS — Z8546 Personal history of malignant neoplasm of prostate: Secondary | ICD-10-CM

## 2016-10-08 ENCOUNTER — Ambulatory Visit (INDEPENDENT_AMBULATORY_CARE_PROVIDER_SITE_OTHER): Payer: Medicare Other | Admitting: Urology

## 2016-10-08 ENCOUNTER — Encounter: Payer: Self-pay | Admitting: Pain Medicine

## 2016-10-08 ENCOUNTER — Encounter: Payer: Self-pay | Admitting: Urology

## 2016-10-08 VITALS — BP 152/79 | HR 78 | Ht 69.0 in | Wt 220.0 lb

## 2016-10-08 DIAGNOSIS — C61 Malignant neoplasm of prostate: Secondary | ICD-10-CM | POA: Diagnosis not present

## 2016-10-08 DIAGNOSIS — M48061 Spinal stenosis, lumbar region without neurogenic claudication: Secondary | ICD-10-CM | POA: Insufficient documentation

## 2016-10-08 DIAGNOSIS — M159 Polyosteoarthritis, unspecified: Secondary | ICD-10-CM | POA: Insufficient documentation

## 2016-10-08 DIAGNOSIS — M150 Primary generalized (osteo)arthritis: Secondary | ICD-10-CM

## 2016-10-08 LAB — PSA: Prostate Specific Ag, Serum: <0.1 ng/mL (ref 0.0–4.0)

## 2016-10-08 NOTE — Progress Notes (Signed)
10/08/2016 1:48 PM   Joshua Humphrey 1929/12/13 [id number]  Referring provider: Kirk Ruths, MD [facility] [place], [place] [address]  Chief Complaint  Patient presents with  . Prostate Cancer    HPI: The patient is an 83 year old gentleman with a past medical history of prostate cancer status post prostatectomy in 2001 who presents for further evaluation. His PSA was undetectable in October 2017. The patient is transferring his care from a urologist in [facility]. I do not have records at this time from the office. Per the patient he was getting Lupron 22.5 (3 month) mg IM every 6 months. He does not think that he had metastatic prostate cancer. He has been on Lupron since his prostate was removed per the patient. It is not clear why he was on Lupron or if he had recurrence of disease.  He has not had a shot of Lupron in over one year.  PSA remained undetectable in August 2018.   PMH: Past Medical History:  Diagnosis Date  . Anemia   . Asthma   . Cancer [facility])    prostate  . Cancer ([place])    skin   . Diabetes mellitus without complication ([place])   . GERD (gastroesophageal reflux disease)   . Hypertension   . PONV (postoperative nausea and vomiting)   . Sleep apnea     Surgical History: Past Surgical History:  Procedure Laterality Date  . CATARACT EXTRACTION, BILATERAL    . ELBOW SURGERY    . EYE SURGERY    . lipoma removal  2000   back   . OLECRANON BURSECTOMY Left 01/21/2016   Procedure: OLECRANON BURSA;  Surgeon: Corky Mull, MD;  Location: [facility] ORS;  Service: Orthopedics;  Laterality: Left;  . PROSTATE SURGERY  2002  . TOE SURGERY    . TONSILLECTOMY    . tumor removed  2000    Home Medications:  Allergies as of 10/08/2016   No Known Allergies     Medication List       Accurate as of 10/08/16  1:48 PM. Always use your most recent med list.          acetaminophen 650 MG CR tablet Commonly known as:   TYLENOL Take 650-1,300 mg by mouth every 8 (eight) hours as needed (for pain.).   aspirin EC 81 MG tablet Take 81 mg by mouth daily.   atorvastatin 40 MG tablet Commonly known as:  LIPITOR Take 40 mg by mouth daily.   beta carotene w/minerals tablet Take 1 tablet by mouth every evening.   OCUVITE ADULT 50+ Caps Take 1 capsule by mouth daily.   carvedilol 25 MG tablet Commonly known as:  COREG TAKE 1 TABLET (25 MG) TWICE DAILY   cetirizine 10 MG tablet Commonly known as:  ZYRTEC Take 10 mg by mouth every evening.   COMBIVENT RESPIMAT 20-100 MCG/ACT Aers respimat Generic drug:  Ipratropium-Albuterol INHALE 2 PUFF FOUR TIMES DAILY AS NEEDED FOR SHORTNESS OF BREATH AND/OR WHEEZING.   ferrous fumarate 325 (106 Fe) MG Tabs tablet Commonly known as:  HEMOCYTE - 106 mg FE Take 1 tablet by mouth daily.   FLAXSEED (LINSEED) PO Take by mouth daily.   fluticasone 50 MCG/ACT nasal spray Commonly known as:  FLONASE Place 1-2 sprays into both nostrils daily as needed (for allergies.).   furosemide 40 MG tablet Commonly known as:  LASIX TAKE 1 TABLET BY MOUTH TWICE DAILY   gabapentin  300 MG capsule Commonly known as:  NEURONTIN TAKE 1 CAPSULE (300 MG) BY MOUTH THREE TIMES DAILY   gabapentin 300 MG capsule Commonly known as:  NEURONTIN Take 1-3 capsules (300-900 mg total) by mouth 4 (four) times daily. Follow titration schedule.   Glucosamine-Chondroitin Caps Take 2 capsules by mouth daily.   hydrALAZINE 50 MG tablet Commonly known as:  APRESOLINE Take 50 mg by mouth 3 (three) times daily.   LEVEMIR FLEXTOUCH 100 UNIT/ML Pen Generic drug:  Insulin Detemir Inject 15 Units into the skin every evening.   losartan 100 MG tablet Commonly known as:  COZAAR Take 100 mg by mouth daily.   metFORMIN 850 MG tablet Commonly known as:  GLUCOPHAGE Take 850 mg by mouth 2 (two) times daily.   mometasone-formoterol 200-5 MCG/ACT Aero Commonly known as:  DULERA Inhale 2 puffs  into the lungs 2 (two) times daily.   montelukast 10 MG tablet Commonly known as:  SINGULAIR Take 10 mg by mouth at bedtime.   NIFEdipine 90 MG 24 hr tablet Commonly known as:  PROCARDIA XL/ADALAT-CC Take 90 mg by mouth daily.   omeprazole 20 MG capsule Commonly known as:  PRILOSEC Take 20 mg by mouth daily.   potassium chloride SA 20 MEQ tablet Commonly known as:  K-DUR,KLOR-CON Take 20 mEq by mouth 2 (two) times daily.   pramipexole 0.25 MG tablet Commonly known as:  MIRAPEX Take 0.25 mg by mouth at bedtime as needed (for restless leg syndrome).   sitaGLIPtin 100 MG tablet Commonly known as:  JANUVIA Take 100 mg by mouth daily.       Allergies: No Known Allergies  Family History: Family History  Problem Relation Age of Onset  . Cancer Mother   . Alcohol abuse Father   . Cancer Brother     Social History:  reports that he has never smoked. He has never used smokeless tobacco. He reports that he drinks alcohol. He reports that he does not use drugs.  ROS: UROLOGY Frequent Urination?: Yes Hard to postpone urination?: No Burning/pain with urination?: No Get up at night to urinate?: Yes Leakage of urine?: Yes Urine stream starts and stops?: No Trouble starting stream?: No Do you have to strain to urinate?: No Blood in urine?: No Urinary tract infection?: No Sexually transmitted disease?: No Injury to kidneys or bladder?: No Painful intercourse?: No Weak stream?: No Erection problems?: No Penile pain?: No  Gastrointestinal Nausea?: No Vomiting?: No Indigestion/heartburn?: No Diarrhea?: No Constipation?: No  Constitutional Fever: No Night sweats?: No Weight loss?: No Fatigue?: No  Skin Skin rash/lesions?: No Itching?: No  Eyes Blurred vision?: No Double vision?: No  Ears/Nose/Throat Sore throat?: No Sinus problems?: No  Hematologic/Lymphatic Swollen glands?: No Easy bruising?: No  Cardiovascular Leg swelling?: No Chest pain?:  No  Respiratory Cough?: No Shortness of breath?: No  Endocrine Excessive thirst?: No  Musculoskeletal Back pain?: Yes Joint pain?: Yes  Neurological Headaches?: No Dizziness?: No  Psychologic Depression?: No Anxiety?: No  Physical Exam: BP (!) 152/79 (BP Location: Left Arm, Patient Position: Sitting, Cuff Size: Normal)   Pulse 78   Ht 5\' 9"  (1.753 m)   Wt 220 lb (99.8 kg)   BMI 32.49 kg/m   Constitutional:  Alert and oriented, No acute distress. HEENT: [place] AT, moist mucus membranes.  Trachea midline, no masses. Cardiovascular: No clubbing, cyanosis, or edema. Respiratory: Normal respiratory effort, no increased work of breathing. GI: Abdomen is soft, nontender, nondistended, no abdominal masses GU: No CVA tenderness.  Skin: No rashes, bruises or suspicious lesions. Lymph: No cervical or inguinal adenopathy. Neurologic: Grossly intact, no focal deficits, moving all 4 extremities. Psychiatric: Normal mood and affect.  Laboratory Data: Lab Results  Component Value Date   WBC 7.7 01/20/2016   HGB 12.1 (L) 01/20/2016   HCT 36.3 (L) 01/20/2016   MCV 94.2 01/20/2016   PLT 275 01/20/2016    Lab Results  Component Value Date   CREATININE 0.75 (L) 09/08/2016    No results found for: PSA  No results found for: TESTOSTERONE  No results found for: HGBA1C  Urinalysis No results found for: COLORURINE, APPEARANCEUR, LABSPEC, PHURINE, GLUCOSEU, HGBUR, BILIRUBINUR, KETONESUR, PROTEINUR, UROBILINOGEN, NITRITE, LEUKOCYTESUR   Assessment & Plan:    1. History of Prostate Cancer I discussed the patient that he still has no evidence of disease. It is not clear why he was on Lupron in the first place. He was attempting to obtain records from his previous urologist, hopefully will have those at his next visit. However, at this time his PSA remains undetectable after being off Lupron for over a year. We will continue to hold Lupron at this time. We'll recheck his PSA in 6  months. If his PSA does arise, we can reinstitute Lupron at that time.  Return in about 6 months (around 04/10/2017) for PSA prior.  Nickie Retort, MD  [facility] [address] [place], [place] [address] [phone]

## 2016-10-15 ENCOUNTER — Ambulatory Visit (HOSPITAL_BASED_OUTPATIENT_CLINIC_OR_DEPARTMENT_OTHER): Payer: Medicare Other | Admitting: Pain Medicine

## 2016-10-15 ENCOUNTER — Ambulatory Visit
Admission: RE | Admit: 2016-10-15 | Discharge: 2016-10-15 | Disposition: A | Discharge status: Home / Self Care | Payer: Medicare Other | Source: Ambulatory Visit | Attending: Pain Medicine | Admitting: Pain Medicine

## 2016-10-15 ENCOUNTER — Encounter: Payer: Self-pay | Admitting: Pain Medicine

## 2016-10-15 VITALS — BP 170/92 | HR 64 | Temp 98.0°F | Resp 16 | Ht 69.0 in | Wt 220.0 lb

## 2016-10-15 DIAGNOSIS — M25552 Pain in left hip: Secondary | ICD-10-CM

## 2016-10-15 DIAGNOSIS — G8929 Other chronic pain: Secondary | ICD-10-CM

## 2016-10-15 DIAGNOSIS — M25562 Pain in left knee: Secondary | ICD-10-CM | POA: Insufficient documentation

## 2016-10-15 DIAGNOSIS — M1612 Unilateral primary osteoarthritis, left hip: Secondary | ICD-10-CM

## 2016-10-15 DIAGNOSIS — E559 Vitamin D deficiency, unspecified: Secondary | ICD-10-CM

## 2016-10-15 DIAGNOSIS — IMO0001 Reserved for inherently not codable concepts without codable children: Secondary | ICD-10-CM

## 2016-10-15 MED ORDER — MIDAZOLAM HCL 5 MG/5ML IJ SOLN: 1.0000 mg | INTRAMUSCULAR | Status: DC | PRN

## 2016-10-15 MED ORDER — LACTATED RINGERS IV SOLN: 1000.0000 mL | Freq: Once | INTRAVENOUS | Status: DC

## 2016-10-15 MED ORDER — FENTANYL CITRATE (PF) 100 MCG/2ML IJ SOLN: 25.0000 ug | INTRAMUSCULAR | Status: DC | PRN

## 2016-10-15 MED ORDER — LIDOCAINE HCL (PF) 1.5 % IJ SOLN
20.0000 mL | Freq: Once | INTRAMUSCULAR | Status: DC
  Filled 2016-10-15: qty 20

## 2016-10-15 MED ORDER — IOPAMIDOL (ISOVUE-M 200) INJECTION 41%
10.0000 mL | Freq: Once | INTRAMUSCULAR | Status: AC
  Administered 2016-10-15: 10 mL via INTRA_ARTICULAR
  Filled 2016-10-15: qty 10

## 2016-10-15 MED ORDER — METHYLPREDNISOLONE ACETATE 80 MG/ML IJ SUSP
80.0000 mg | Freq: Once | INTRAMUSCULAR | Status: AC
  Administered 2016-10-15: 80 mg via INTRA_ARTICULAR
  Filled 2016-10-15: qty 1

## 2016-10-15 MED ORDER — ROPIVACAINE HCL 2 MG/ML IJ SOLN
4.0000 mL | Freq: Once | INTRAMUSCULAR | Status: AC
  Administered 2016-10-15: 4 mL via INTRA_ARTICULAR
  Filled 2016-10-15: qty 10

## 2016-10-15 NOTE — Progress Notes (Signed)
Patient's Name: Joshua Humphrey  MRN: [id number]  Referring Provider: Milinda Pointer, MD  DOB: 09-18-1929  PCP: Kirk Ruths, MD  DOS: 10/15/2016  Note by: Gaspar Cola, MD  Service setting: Ambulatory outpatient  Specialty: Interventional Pain Management  Patient type: Established  Location: [facility] (AMB) Pain Management Facility  Visit type: Interventional Procedure   Primary Reason for Visit: Interventional Pain Management Treatment. CC: Hip Pain (left)  Procedure:  Anesthesia, Analgesia, Anxiolysis:  Type: Therapeutic Intra-Articular Hip Injection Region:  Posterolateral hip joint area. Level: Lower pelvic and hip joint level. Laterality: Left-Sided  Type: Local Anesthesia Local Anesthetic: Lidocaine 1% Route: Infiltration ([place]/IM) IV Access: Declined Sedation: Declined  Indication(s): Analgesia          Indications: 1. Chronic hip pain (Primary Source of Pain) (Left)   2. Osteoarthritis of hip (Left)   3. Chronic hip arthralgia (Left)   4. 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 Vitamin D insufficiency    Pain Score: Pre-procedure: 4 /10 Post-procedure: 0-No pain/10  Pre-op Assessment:  Joshua Humphrey is a 83 y.o. (year old), male patient, seen today for interventional treatment. He  has a past surgical history that includes tumor removed (2000); Prostate surgery (2002); lipoma removal (2000); Tonsillectomy; Cataract extraction, bilateral; Toe Surgery; Olecranon bursectomy (Left, 01/21/2016); Elbow surgery; and Eye surgery. Joshua Humphrey has a current medication list which includes the following prescription(s): acetaminophen, aspirin ec, atorvastatin, calcium carbonate-vitamin d, carvedilol, cetirizine, ferrous fumarate, flaxseed (linseed), fluticasone, furosemide, gabapentin, glucosamine-chondroitin, hydralazine, ipratropium-albuterol, levemir flextouch, losartan, metformin, mometasone-formoterol, montelukast, ocuvite adult 50+, nifedipine, omeprazole, potassium  chloride sa, pramipexole, and sitagliptin, and the following Facility-Administered Medications: lidocaine. His primarily concern today is the Hip Pain (left)  Initial Vital Signs: There were no vitals taken for this visit. BMI: Estimated body mass index is 32.49 kg/m as calculated from the following:   Height as of this encounter: 5\' 9"  (1.753 m).   Weight as of this encounter: 220 lb (99.8 kg).  Risk Assessment: Allergies: Reviewed. He has No Known Allergies.  Allergy Precautions: None required Coagulopathies: Reviewed. None identified.  Blood-thinner therapy: None at this time Active Infection(s): Reviewed. None identified. Joshua Humphrey is afebrile  Site Confirmation: Joshua Humphrey was asked to confirm the procedure and laterality before marking the site Procedure checklist: Completed Consent: Before the procedure and under the influence of no sedative(s), amnesic(s), or anxiolytics, the patient was informed of the treatment options, risks and possible complications. To fulfill our ethical and legal obligations, as recommended by the American Medical Association's Code of Ethics, I have informed the patient of my clinical impression; the nature and purpose of the treatment or procedure; the risks, benefits, and possible complications of the intervention; the alternatives, including doing nothing; the risk(s) and benefit(s) of the alternative treatment(s) or procedure(s); and the risk(s) and benefit(s) of doing nothing. The patient was provided information about the general risks and possible complications associated with the procedure. These may include, but are not limited to: failure to achieve desired goals, infection, bleeding, organ or nerve damage, allergic reactions, paralysis, and death. In addition, the patient was informed of those risks and complications associated to the procedure, such as failure to decrease pain; infection; bleeding; organ or nerve damage with subsequent damage to  sensory, motor, and/or autonomic systems, resulting in permanent pain, numbness, and/or weakness of one or several areas of the body; allergic reactions; (i.e.: anaphylactic reaction); and/or death. Furthermore, the patient was informed of those risks and complications associated  with the medications. These include, but are not limited to: allergic reactions (i.e.: anaphylactic or anaphylactoid reaction(s)); adrenal axis suppression; blood sugar elevation that in diabetics may result in ketoacidosis or comma; water retention that in patients with history of congestive heart failure may result in shortness of breath, pulmonary edema, and decompensation with resultant heart failure; weight gain; swelling or edema; medication-induced neural toxicity; particulate matter embolism and blood vessel occlusion with resultant organ, and/or nervous system infarction; and/or aseptic necrosis of one or more joints. Finally, the patient was informed that Medicine is not an exact science; therefore, there is also the possibility of unforeseen or unpredictable risks and/or possible complications that may result in a catastrophic outcome. The patient indicated having understood very clearly. We have given the patient no guarantees and we have made no promises. Enough time was given to the patient to ask questions, all of which were answered to the patient's satisfaction. Joshua Humphrey has indicated that he wanted to continue with the procedure. Attestation: I, the ordering provider, attest that I have discussed with the patient the benefits, risks, side-effects, alternatives, likelihood of achieving goals, and potential problems during recovery for the procedure that I have provided informed consent. Date: 10/15/2016; Time: 8:25 AM  Pre-Procedure Preparation:  Monitoring: As per clinic protocol. Respiration, ETCO2, SpO2, BP, heart rate and rhythm monitor placed and checked for adequate function Safety Precautions: Patient was  assessed for positional comfort and pressure points before starting the procedure. Time-out: I initiated and conducted the "Time-out" before starting the procedure, as per protocol. The patient was asked to participate by confirming the accuracy of the "Time Out" information. Verification of the correct person, site, and procedure were performed and confirmed by me, the nursing staff, and the patient. "Time-out" conducted as per Joint Commission's Universal Protocol (UP.01.01.01). "Time-out" Date & Time: 10/15/2016; 1035 hrs.  Description of Procedure Process:   Position: Lateral Decubitus with bad side up Target Area: Superior aspect of the hip joint cavity, going thru the superior portion of the capsular ligament. Approach: Posterolateral approach. Area Prepped: Entire Posterolateral hip area. Prepping solution: ChloraPrep (2% chlorhexidine gluconate and 70% isopropyl alcohol) Safety Precautions: Aspiration looking for blood return was conducted prior to all injections. At no point did we inject any substances, as a needle was being advanced. No attempts were made at seeking any paresthesias. Safe injection practices and needle disposal techniques used. Medications properly checked for expiration dates. SDV (single dose vial) medications used. Description of the Procedure: Protocol guidelines were followed. The patient was placed in position over the fluoroscopy table. The target area was identified and the area prepped in the usual manner. Skin & deeper tissues infiltrated with local anesthetic. Appropriate amount of time allowed to pass for local anesthetics to take effect. The procedure needles were then advanced to the target area. Proper needle placement secured. Negative aspiration confirmed. Solution injected in intermittent fashion, asking for systemic symptoms every 0.5cc of injectate. The needles were then removed and the area cleansed, making sure to leave some of the prepping solution back to  take advantage of its long term bactericidal properties. Vitals:   10/15/16 1035 10/15/16 1040 10/15/16 1045 10/15/16 1050  BP: (!) 156/90 (!) 170/99 (!) 178/97 (!) 170/92  Pulse: 64 64 65 64  Resp: 17 17 20 16   Temp:      SpO2: 96% 93% 93% 94%  Weight:      Height:        Start Time: 1035 hrs. End Time:  1045 hrs. Materials:  Needle(s) Type: Regular needle Gauge: 22G Length: 3.5-in Medication(s): We administered methylPREDNISolone acetate, ropivacaine (PF) 2 mg/mL (0.2%), and iopamidol. Please see chart orders for dosing details.  Imaging Guidance (Non-Spinal):  Type of Imaging Technique: Fluoroscopy Guidance (Non-Spinal) Indication(s): Assistance in needle guidance and placement for procedures requiring needle placement in or near specific anatomical locations not easily accessible without such assistance. Exposure Time: Please see nurses notes. Contrast: Before injecting any contrast, we confirmed that the patient did not have an allergy to iodine, shellfish, or radiological contrast. Once satisfactory needle placement was completed at the desired level, radiological contrast was injected. Contrast injected under live fluoroscopy. No contrast complications. See chart for type and volume of contrast used. Fluoroscopic Guidance: I was personally present during the use of fluoroscopy. "Tunnel Vision Technique" used to obtain the best possible view of the target area. Parallax error corrected before commencing the procedure. "Direction-depth-direction" technique used to introduce the needle under continuous pulsed fluoroscopy. Once target was reached, antero-posterior, oblique, and lateral fluoroscopic projection used confirm needle placement in all planes. Images permanently stored in EMR. Interpretation: I personally interpreted the imaging intraoperatively. Adequate needle placement confirmed in multiple planes. Appropriate spread of contrast into desired area was observed. No evidence of  afferent or efferent intravascular uptake. Permanent images saved into the patient's record. Injection of contrast confirmed that a needle placement to be intra-articular. However, the images of the hip reveal, without a doubt, there is severe degeneration of the joint with complete asymmetry of the head of the femur compared to the right side. There are no smooth edges on the head of the left femur, no joint space, and no preservation of the normal anatomy.  Antibiotic Prophylaxis:  Indication(s): None identified Antibiotic given: None  Post-operative Assessment:  EBL: None Complications: No immediate post-treatment complications observed by team, or reported by patient. Note: The patient tolerated the entire procedure well. A repeat set of vitals were taken after the procedure and the patient was kept under observation following institutional policy, for this type of procedure. Post-procedural neurological assessment was performed, showing return to baseline, prior to discharge. The patient was provided with post-procedure discharge instructions, including a section on how to identify potential problems. Should any problems arise concerning this procedure, the patient was given instructions to immediately contact us, at any time, without hesitation. In any case, we plan to contact the patient by telephone for a follow-up status report regarding this interventional procedure. Comments:  No additional relevant information.  Plan of Care  Disposition: Discharge home  Discharge Date & Time: 10/15/2016; 1052 hrs.  Physician-requested Follow-up:  Return for post-procedure eval by Dr. Dossie Arbour in 2 weeks.  New Prescriptions   No medications on file   Future Appointments Date Time Provider [place]  11/04/2016 9:30 AM Milinda Pointer, MD [facility] None  01/04/2017 1:00 PM Milinda Pointer, MD [facility] None  04/06/2017 2:45 PM [facility] None  04/08/2017 1:45 PM [facility] None    Imaging Orders     DG C-Arm 1-60 Min-No Report Procedure Orders    No procedure(s) ordered today   Medications ordered for procedure: Meds ordered this encounter  Medications  . DISCONTD: lactated ringers infusion 1,000 mL  . DISCONTD: midazolam (VERSED) 5 MG/5ML injection 1-2 mg    Make sure Flumazenil is available in the pyxis when using this medication. If oversedation occurs, administer 0.2 mg IV over 15 sec. If after 45 sec no response, administer 0.2 mg  again over 1 min; may repeat at 1 min intervals; not to exceed 4 doses (1 mg)  . DISCONTD: fentaNYL (SUBLIMAZE) injection 25-50 mcg    Make sure Narcan is available in the pyxis when using this medication. In the event of respiratory depression (RR< 8/min): Titrate NARCAN (naloxone) in increments of 0.1 to 0.2 mg IV at 2-3 minute intervals, until desired degree of reversal.  . methylPREDNISolone acetate (DEPO-MEDROL) injection 80 mg  . ropivacaine (PF) 2 mg/mL (0.2%) (NAROPIN) injection 4 mL  . lidocaine 1.5 % injection 20 mL    From block tray.  . iopamidol (ISOVUE-M) 41 % intrathecal injection 10 mL   Medications administered: We administered methylPREDNISolone acetate, ropivacaine (PF) 2 mg/mL (0.2%), and iopamidol.  See the medical record for exact dosing, route, and time of administration.  Primary Care Physician: Kirk Ruths, MD Location: [facility] Note by: Gaspar Cola, MD Date: 10/15/2016; Time: 10:58 AM  Disclaimer:  Medicine is not an exact science. The only guarantee in medicine is that nothing is guaranteed. It is important to note that the decision to proceed with this intervention was based on the information collected from the patient. The Data and conclusions were drawn from the patient's questionnaire, the interview, and the physical examination. Because the information was provided in large part by the patient, it cannot be guaranteed that  it has not been purposely or unconsciously manipulated. Every effort has been made to obtain as much relevant data as possible for this evaluation. It is important to note that the conclusions that lead to this procedure are derived in large part from the available data. Always take into account that the treatment will also be dependent on availability of resources and existing treatment guidelines, considered by other Pain Management Practitioners as being common knowledge and practice, at the time of the intervention. For Medico-Legal purposes, it is also important to point out that variation in procedural techniques and pharmacological choices are the acceptable norm. The indications, contraindications, technique, and results of the above procedure should only be interpreted and judged by a Board-Certified Interventional Pain Specialist with extensive familiarity and expertise in the same exact procedure and technique.

## 2016-10-15 NOTE — Patient Instructions (Signed)

## 2016-10-15 NOTE — Progress Notes (Signed)
Safety precautions to be maintained throughout the outpatient stay will include: orient to surroundings, keep bed in low position, maintain call bell within reach at all times, provide assistance with transfer out of bed and ambulation.  

## 2016-10-16 ENCOUNTER — Telehealth: Payer: Self-pay

## 2016-10-16 NOTE — Telephone Encounter (Signed)
Post procedure phone call.  Patient states he is doing fine.  

## 2016-11-03 DIAGNOSIS — M4802 Spinal stenosis, cervical region: Secondary | ICD-10-CM | POA: Insufficient documentation

## 2016-11-03 NOTE — Progress Notes (Signed)
Patient's Name: Joshua Humphrey  MRN: [id number]  Referring Provider: Kirk Ruths, MD  DOB: [birth year]  PCP: Kirk Ruths, MD  DOS: 11/04/2016  Note by: Gaspar Cola, MD  Service setting: Ambulatory outpatient  Specialty: Interventional Pain Management  Location: [facility] (AMB) Pain Management Facility    Patient type: Established   Primary Reason(s) for Visit: Encounter for post-procedure evaluation of chronic illness with mild to moderate exacerbation CC: Hip Pain (left); Back Pain (lower); and Rectal Pain (across the buttocks)  HPI  Mr. Heaphy is a 83 y.o. year old, male patient, who comes today for a post-procedure evaluation. 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[place]); Chronic pain syndrome; Long term (current) use of opiate analgesic; Long term prescription opiate use; Opiate use (30 MME/Day); Chronic low back pain (Primary Area of Pain) (Bilateral) (R>L); Chronic lower extremity pain (Tertiary source of pain) (Bilateral) (L>R); Abnormal MRI, lumbar spine; Abnormal MRI, cervical spine; Lumbar spinal stenosis with neurogenic claudication; Disturbance of skin sensation; Chronic hip pain (Secondary Area of Pain) (Left); Chronic knee pain (Bilateral) (L>R); Generalized weakness; Muscle weakness (generalized); Chronic Lumbar radicular pain (Left) (L5); Neurogenic pain; Osteoarthritis; Lumbar foraminal stenosis (Right: L4-5 and L5-S1) (Left: L2-3, L3-4, and L4-5); Osteoarthritis of hip (Left);  Chronic hip arthralgia (Left); Chronic arthralgias of knees and hips (Left); Vitamin D insufficiency; Cervical spinal stenosis; Cervical foraminal stenosis; Lumbar facet syndrome (Bilateral); and Osteoarthritis of lumbar spine on his problem list. His primarily concern today is the Hip Pain (left); Back Pain (lower); and Rectal Pain (across the buttocks)  Pain Assessment: Location: Left Hip Radiating: down the leg to front of knee Onset: More than a month ago Duration: Chronic pain Quality: Aching, Constant Severity: 1 /10 (self-reported pain score)  Note: Reported level is compatible with observation.                   Effect on ADL: pace self Modifying factors:  hip injection helps  Mr. Rosol comes in today for post-procedure evaluation after the treatment done on 10/15/2016. The patient returns today having obtained at least 75% relief of his hip pain. He is very happy with this. In addition to this, his hip pain is no longer his primary concern. At this point, his primary pain is that of the lower back. This pain is bilateral and increases with hyperextension and rotation, suggesting facet joint disease. The patient also has significant decreased range of motion of the cervical spine. At this point, we will bring the patient back for a diagnostic bilateral lumbar facet block under fluoroscopic guidance and IV sedation.  Topics covered today: Mr. Im primary cause of pain, the treatment plan, medication side effects and the results, interpretation and significance of  his recent diagnostic interventional treatment(s).  Further details on both, my assessment(s), as well as the proposed treatment plan, please see below.  Post-Procedure Assessment  10/15/2016 Procedure: Diagnostic left intra-articular hip joint injection under fluoroscopic guidance, no sedation Pre-procedure pain score:  4/10 Post-procedure pain score: 0/10 (100% relief) Influential Factors: BMI: 32.49 kg/m Intra-procedural  challenges: None observed.         Assessment challenges: None detected.  Reported side-effects: None.        Post-procedural adverse reactions or complications: None reported         Sedation: No sedation used. When no sedatives are used, the analgesic levels obtained are directly associated to the effectiveness of the local anesthetics. However, when sedation is provided, the level of analgesia obtained during the initial 1 hour following the intervention, is believed to be the result of a combination of factors. These factors may include, but are not limited to: 1. The effectiveness of the local anesthetics used. 2. The effects of the analgesic(s) and/or anxiolytic(s) used. 3. The degree of discomfort experienced by the patient at the time of the procedure. 4. The patients ability and reliability in recalling and recording the events. 5. The presence and influence of possible secondary gains and/or psychosocial factors. Reported result: Relief experienced during the 1st hour after the procedure: 95 % (Ultra-Short Term Relief)            Interpretative annotation: Clinically appropriate result. Analgesia during this period is likely to be Local Anesthetic and/or IV Sedative (Analgesic/Anxiolytic) related.          Effects of local anesthetic: The analgesic effects attained during this period are directly associated to the localized infiltration of local anesthetics and therefore cary significant diagnostic value as to the etiological location, or anatomical origin, of the pain. Expected duration of relief is directly dependent on the pharmacodynamics of the local anesthetic used. Long-acting (4-6 hours) anesthetics used.  Reported result: Relief during the next 4 to 6 hour after the procedure: 95 % (Short-Term Relief)            Interpretative annotation: Clinically appropriate result. Analgesia during this period is likely to be Local Anesthetic-related.          Long-term benefit:  Defined as the period of time past the expected duration of local anesthetics (1 hour for short-acting and 4-6 hours for long-acting). With the possible exception of prolonged sympathetic blockade from the local anesthetics, benefits during this period are typically attributed to, or associated with, other factors such as analgesic sensory neuropraxia, antiinflammatory effects, or beneficial biochemical changes provided by agents other than the local anesthetics.  Reported result: Extended relief following procedure: 75 % (Long-Term Relief)            Interpretative annotation: Clinically appropriate result. Good relief. No permanent benefit expected. Inflammation plays a part in the etiology to the pain.          Current benefits: Defined as persistent relief that continues at this point in time.   Reported results: Treated area: 75 % Mr. Hagood reports improvement in function Interpretative annotation: Recurrence of symptoms. No permanent benefit expected. Effective diagnostic intervention.          Interpretation: Results would suggest a successful diagnostic intervention.                  Plan:  Set up procedure as a PRN palliative treatment option for this patient.  Laboratory Chemistry  Inflammation Markers (CRP: Acute Phase) (ESR: Chronic Phase) Lab Results  Component Value Date   CRP 0.7 09/08/2016   ESRSEDRATE 16 09/08/2016                 Renal Function Markers Lab Results  Component Value Date   BUN 20 09/08/2016   CREATININE 0.75 (L) 09/08/2016   GFRAA 97 09/08/2016   GFRNONAA 84 09/08/2016  Hepatic Function Markers Lab Results  Component Value Date   AST 17 09/08/2016   ALT 15 09/08/2016   ALBUMIN 4.2 09/08/2016   ALKPHOS 64 09/08/2016                 Electrolytes Lab Results  Component Value Date   NA 144 09/08/2016   K 3.8 09/08/2016   CL 105 09/08/2016   CALCIUM 9.2 09/08/2016   MG 1.8 09/08/2016                 Neuropathy Markers Lab  Results  Component Value Date   VITAMINB12 264 09/08/2016                 Bone Pathology Markers Lab Results  Component Value Date   ALKPHOS 64 09/08/2016   25OHVITD1 41 09/08/2016   25OHVITD2 <1.0 09/08/2016   25OHVITD3 41 09/08/2016   CALCIUM 9.2 09/08/2016                 Coagulation Parameters Lab Results  Component Value Date   PLT 275 01/20/2016                 Cardiovascular Markers Lab Results  Component Value Date   HGB 12.1 (L) 01/20/2016   HCT 36.3 (L) 01/20/2016                 Note: Lab results reviewed.  Recent Diagnostic Imaging Review  Dg C-arm 1-60 Min-no Report  Result Date: 10/15/2016 Fluoroscopy was utilized by the requesting physician.  No radiographic interpretation.   Note: Imaging results reviewed.          Meds   Current Outpatient Prescriptions:  .  acetaminophen (TYLENOL) 650 MG CR tablet, Take 650-1,300 mg by mouth every 8 (eight) hours as needed (for pain.). , Disp: , Rfl:  .  aspirin EC 81 MG tablet, Take 81 mg by mouth daily. , Disp: , Rfl:  .  atorvastatin (LIPITOR) 40 MG tablet, Take 40 mg by mouth daily. , Disp: , Rfl:  .  Calcium Carbonate-Vitamin D (CALTRATE 600+D PO), Take 2 tablets by mouth daily., Disp: , Rfl:  .  carvedilol (COREG) 25 MG tablet, TAKE 1 TABLET (25 MG) TWICE DAILY, Disp: , Rfl:  .  cetirizine (ZYRTEC) 10 MG tablet, Take 10 mg by mouth every evening. , Disp: , Rfl:  .  ferrous fumarate (HEMOCYTE - 106 MG FE) 325 (106 FE) MG TABS tablet, Take 1 tablet by mouth 2 (two) times daily. , Disp: , Rfl:  .  FLAXSEED, LINSEED, PO, Take by mouth daily., Disp: , Rfl:  .  fluticasone (FLONASE) 50 MCG/ACT nasal spray, Place 1-2 sprays into both nostrils daily as needed (for allergies.). , Disp: , Rfl:  .  furosemide (LASIX) 40 MG tablet, TAKE 1 TABLET BY MOUTH TWICE DAILY, Disp: , Rfl:  .  gabapentin (NEURONTIN) 300 MG capsule, Take 1-3 capsules (300-900 mg total) by mouth 4 (four) times daily. Follow titration schedule., Disp:  360 capsule, Rfl: 2 .  Glucosamine-Chondroit-Vit C-Mn (GLUCOSAMINE-CHONDROITIN) CAPS, Take 2 capsules by mouth daily., Disp: , Rfl:  .  hydrALAZINE (APRESOLINE) 50 MG tablet, Take 50 mg by mouth 3 (three) times daily. , Disp: , Rfl:  .  Ipratropium-Albuterol (COMBIVENT RESPIMAT) 20-100 MCG/ACT AERS respimat, INHALE 2 PUFF FOUR TIMES DAILY AS NEEDED FOR SHORTNESS OF BREATH AND/OR WHEEZING., Disp: , Rfl:  .  LEVEMIR FLEXTOUCH 100 UNIT/ML Pen, Inject 15 Units into the skin every evening. , Disp: ,  Rfl:  .  losartan (COZAAR) 100 MG tablet, Take 100 mg by mouth daily. , Disp: , Rfl:  .  metFORMIN (GLUCOPHAGE) 850 MG tablet, Take 850 mg by mouth 2 (two) times daily. , Disp: , Rfl:  .  mometasone-formoterol (DULERA) 200-5 MCG/ACT AERO, Inhale 2 puffs into the lungs 2 (two) times daily. , Disp: , Rfl:  .  montelukast (SINGULAIR) 10 MG tablet, Take 10 mg by mouth at bedtime., Disp: , Rfl:  .  Multiple Vitamins-Minerals (OCUVITE ADULT 50+) CAPS, Take 1 capsule by mouth daily. , Disp: , Rfl:  .  NIFEdipine (PROCARDIA XL/ADALAT-CC) 90 MG 24 hr tablet, Take 90 mg by mouth daily. , Disp: , Rfl:  .  omeprazole (PRILOSEC) 20 MG capsule, Take 20 mg by mouth daily., Disp: , Rfl:  .  potassium chloride SA (K-DUR,KLOR-CON) 20 MEQ tablet, Take 20 mEq by mouth 2 (two) times daily. , Disp: , Rfl:  .  pramipexole (MIRAPEX) 0.25 MG tablet, Take 0.25 mg by mouth at bedtime as needed (for restless leg syndrome). , Disp: , Rfl:  .  sitaGLIPtin (JANUVIA) 100 MG tablet, Take 100 mg by mouth daily., Disp: , Rfl:   ROS  Constitutional: Denies any fever or chills Gastrointestinal: No reported hemesis, hematochezia, vomiting, or acute GI distress Musculoskeletal: Denies any acute onset joint swelling, redness, loss of ROM, or weakness Neurological: No reported episodes of acute onset apraxia, aphasia, dysarthria, agnosia, amnesia, paralysis, loss of coordination, or loss of consciousness  Allergies  Mr. [place] has No Known  Allergies.  [place]  Drug: Mr. Honeycutt  reports that he does not use drugs. Alcohol:  reports that he drinks alcohol. Tobacco:  reports that he has never smoked. He has never used smokeless tobacco. Medical:  has a past medical history of Anemia; Asthma; Cancer ([place]); Cancer ([place]); Diabetes mellitus without complication ([place]); GERD (gastroesophageal reflux disease); Hypertension; PONV (postoperative nausea and vomiting); Septic olecranon bursitis of left elbow (01/22/2016); and Sleep apnea. Surgical: Mr. Siedlecki  has a past surgical history that includes tumor removed (2000); Prostate surgery (2002); lipoma removal (2000); Tonsillectomy; Cataract extraction, bilateral; Toe Surgery; Olecranon bursectomy (Left, 01/21/2016); Elbow surgery; and Eye surgery. Family: family history includes Alcohol abuse in his father; Cancer in his brother and mother.  Constitutional Exam  General appearance: Well nourished, well developed, and well hydrated. In no apparent acute distress Vitals:   11/04/16 0939  BP: 131/75  Pulse: 72  Resp: 16  Temp: 98.2 F (36.8 C)  SpO2: 94%  Weight: 220 lb (99.8 kg)  Height: 5' 9"  (1.753 m)   BMI Assessment: Estimated body mass index is 32.49 kg/m as calculated from the following:   Height as of this encounter: 5' 9"  (1.753 m).   Weight as of this encounter: 220 lb (99.8 kg).  BMI interpretation table: BMI level Category Range association with higher incidence of chronic pain  <18 kg/m2 Underweight   18.5-24.9 kg/m2 Ideal body weight   25-29.9 kg/m2 Overweight Increased incidence by 20%  30-34.9 kg/m2 Obese (Class I) Increased incidence by 68%  35-39.9 kg/m2 Severe obesity (Class II) Increased incidence by 136%  >40 kg/m2 Extreme obesity (Class III) Increased incidence by 254%   BMI Readings from Last 4 Encounters:  11/04/16 32.49 kg/m  10/15/16 32.49 kg/m  10/08/16 32.49 kg/m  10/05/16 32.49 kg/m   Wt Readings from Last 4 Encounters:  11/04/16 220 lb (99.8  kg)  10/15/16 220 lb (99.8 kg)  10/08/16 220 lb (99.8 kg)  10/05/16  220 lb (99.8 kg)  Psych/Mental status: Alert, oriented x 3 (person, place, & time)       Eyes: PERLA Respiratory: No evidence of acute respiratory distress  Cervical Spine Area Exam  Skin & Axial Inspection: No masses, redness, edema, swelling, or associated skin lesions Alignment: Symmetrical Functional ROM: Unrestricted ROM      Stability: No instability detected Muscle Tone/Strength: Functionally intact. No obvious neuro-muscular anomalies detected. Sensory (Neurological): Unimpaired Palpation: No palpable anomalies              Upper Extremity (UE) Exam    Side: Right upper extremity  Side: Left upper extremity  Skin & Extremity Inspection: Skin color, temperature, and hair growth are WNL. No peripheral edema or cyanosis. No masses, redness, swelling, asymmetry, or associated skin lesions. No contractures.  Skin & Extremity Inspection: Skin color, temperature, and hair growth are WNL. No peripheral edema or cyanosis. No masses, redness, swelling, asymmetry, or associated skin lesions. No contractures.  Functional ROM: Unrestricted ROM          Functional ROM: Unrestricted ROM          Muscle Tone/Strength: Functionally intact. No obvious neuro-muscular anomalies detected.  Muscle Tone/Strength: Functionally intact. No obvious neuro-muscular anomalies detected.  Sensory (Neurological): Unimpaired          Sensory (Neurological): Unimpaired          Palpation: No palpable anomalies              Palpation: No palpable anomalies              Specialized Test(s): Deferred         Specialized Test(s): Deferred          Thoracic Spine Area Exam  Skin & Axial Inspection: No masses, redness, or swelling Alignment: Symmetrical Functional ROM: Unrestricted ROM Stability: No instability detected Muscle Tone/Strength: Functionally intact. No obvious neuro-muscular anomalies detected. Sensory (Neurological): Unimpaired Muscle  strength & Tone: No palpable anomalies  Lumbar Spine Area Exam  Skin & Axial Inspection: No masses, redness, or swelling Alignment: Asymmetric Functional ROM: Minimal ROM      Stability: No instability detected Muscle Tone/Strength: Functionally intact. No obvious neuro-muscular anomalies detected. Sensory (Neurological): Movement-associated pain Palpation: Complains of area being tender to palpation       Provocative Tests: Lumbar Hyperextension and rotation test: Positive bilaterally for facet joint pain. Lumbar Lateral bending test: evaluation deferred today       Patrick's Maneuver: evaluation deferred today                    Gait & Posture Assessment  Ambulation: Patient came in today in a wheel chair Gait: Very limited, using assistive device to ambulate Posture: Difficulty standing up straight, due to pain   Lower Extremity Exam    Side: Right lower extremity  Side: Left lower extremity  Skin & Extremity Inspection: Skin color, temperature, and hair growth are WNL. No peripheral edema or cyanosis. No masses, redness, swelling, asymmetry, or associated skin lesions. No contractures.  Skin & Extremity Inspection: Skin color, temperature, and hair growth are WNL. No peripheral edema or cyanosis. No masses, redness, swelling, asymmetry, or associated skin lesions. No contractures.  Functional ROM: Unrestricted ROM          Functional ROM: Unrestricted ROM          Muscle Tone/Strength: Functionally intact. No obvious neuro-muscular anomalies detected.  Muscle Tone/Strength: Functionally intact. No obvious neuro-muscular anomalies detected.  Sensory (Neurological):  Unimpaired  Sensory (Neurological): Unimpaired  Palpation: No palpable anomalies  Palpation: No palpable anomalies   Assessment  Primary Diagnosis & Pertinent Problem List: The primary encounter diagnosis was Chronic low back pain (Secondary source of pain) (Bilateral) (L>R). Diagnoses of Lumbar facet syndrome (Bilateral),  Chronic hip arthralgia (Left), Osteoarthritis of lumbar spine, and Osteoarthritis of hip (Left) were also pertinent to this visit.  Status Diagnosis  Persistent Persistent Improved 1. Chronic low back pain (Secondary source of pain) (Bilateral) (L>R)   2. Lumbar facet syndrome (Bilateral)   3. Chronic hip arthralgia (Left)   4. Osteoarthritis of lumbar spine   5. Osteoarthritis of hip (Left)     Problems updated and reviewed during this visit: Problem  Lumbar facet syndrome (Bilateral)  Osteoarthritis of lumbar spine  Chronic hip pain (Secondary Area of Pain) (Left)  Chronic low back pain (Primary Area of Pain) (Bilateral) (R>L)   Plan of Care  Pharmacotherapy (Medications Ordered): No orders of the defined types were placed in this encounter.  New Prescriptions   No medications on file   Medications administered today: Mr. Kilker had no medications administered during this visit.   Procedure Orders     LUMBAR FACET(MEDIAL BRANCH NERVE BLOCK) MBNB     HIP INJECTION Lab Orders  No laboratory test(s) ordered today   Imaging Orders  No imaging studies ordered today   Referral Orders  No referral(s) requested today    Interventional management options: Planned, scheduled, and/or pending:   Diagnostic bilateral lumbar facet block EMG/PNCV nerve conduction test  May need surgical decompression   Considering:   Diagnostic left intra-articular hip joint injection Possible diagnostic left femoral nerve + obturator nerve block Possible left femoral nerve + obturator nerve RFA Diagnostic left L2-3 interlaminar lumbar epidural steroid injection Diagnostic left L3-4 interlaminar lumbar epidural steroid injection Diagnosticleft L4-5 interlaminar lumbar epidural steroid injection Diagnostic left L2-3, L3-4 and L4-5 transforaminal epidural steroid injection  Diagnostic right L4-5 and L5-S1 transforaminal epidural steroid injection  Diagnostic bilateral lumbar facet  block Possible bilateral lumbar facet RFA Diagnostic bilateral intra-articular knee joint injection with local anesthetic and steroids Possible series of 5 bilateral intra-articular Hyalgan knee injections Diagnostic bilateral genicular nerve block Possible bilateral genicular nerve RFA    Palliative PRN treatment(s):   Palliative left intra-articular hip joint injectionunder fluoroscopic guidance and IV sedation  Diagnostic left L2-3 interlaminar lumbar epidural steroid injection Diagnostic left L3-4 interlaminar lumbar epidural steroid injection Diagnosticleft L4-5 interlaminar lumbar epidural steroid injection Diagnostic left L2-3, L3-4 and L4-5 transforaminal epidural steroid injection  Diagnostic right L4-5 and L5-S1 transforaminal epidural steroid injection    Provider-requested follow-up: Return for Procedure (with sedation): (B) L-FCT BLK.  Future Appointments Date Time Provider [place]  11/26/2016 9:45 AM Milinda Pointer, MD [facility] None  01/04/2017 1:00 PM Milinda Pointer, MD [facility] None  04/06/2017 2:45 PM [facility] None  04/08/2017 1:45 PM [facility] None   Primary Care Physician: Kirk Ruths, MD Location: [facility] Note by: Gaspar Cola, MD Date: 11/04/2016; Time: 12:52 PM

## 2016-11-04 ENCOUNTER — Ambulatory Visit: Payer: Medicare Other | Attending: Pain Medicine | Admitting: Pain Medicine

## 2016-11-04 ENCOUNTER — Encounter: Payer: Self-pay | Admitting: Pain Medicine

## 2016-11-04 VITALS — BP 131/75 | HR 72 | Temp 98.2°F | Resp 16 | Ht 69.0 in | Wt 220.0 lb

## 2016-11-04 DIAGNOSIS — M47816 Spondylosis without myelopathy or radiculopathy, lumbar region: Secondary | ICD-10-CM | POA: Diagnosis not present

## 2016-11-04 DIAGNOSIS — M5441 Lumbago with sciatica, right side: Secondary | ICD-10-CM | POA: Diagnosis not present

## 2016-11-04 DIAGNOSIS — M1612 Unilateral primary osteoarthritis, left hip: Secondary | ICD-10-CM

## 2016-11-04 DIAGNOSIS — M5442 Lumbago with sciatica, left side: Secondary | ICD-10-CM

## 2016-11-04 DIAGNOSIS — M25552 Pain in left hip: Secondary | ICD-10-CM | POA: Diagnosis not present

## 2016-11-04 DIAGNOSIS — G8929 Other chronic pain: Secondary | ICD-10-CM

## 2016-11-04 DIAGNOSIS — M4696 Unspecified inflammatory spondylopathy, lumbar region: Secondary | ICD-10-CM

## 2016-11-04 NOTE — Patient Instructions (Addendum)
____________________________________________________________________________________________  Preparing for Procedure with Sedation Instructions: . Oral Intake: Do not eat or drink anything for at least 8 hours prior to your procedure. . Transportation: Public transportation is not allowed. Bring an adult driver. The driver must be physically present in our waiting room before any procedure can be started. Marland Kitchen Physical Assistance: Bring an adult physically capable of assisting you, in the event you need help. This adult should keep you company at home for at least 6 hours after the procedure. . Blood Pressure Medicine: Take your blood pressure medicine with a sip of water the morning of the procedure. . Blood thinners:  . Diabetics on insulin: Notify the staff so that you can be scheduled 1st case in the morning. If your diabetes requires high dose insulin, take only  of your normal insulin dose the morning of the procedure and notify the staff that you have done so. . Preventing infections: Shower with an antibacterial soap the morning of your procedure. . Build-up your immune system: Take 1000 mg of Vitamin C with every meal (3 times a day) the day prior to your procedure. Marland Kitchen Antibiotics: Inform the staff if you have a condition or reason that requires you to take antibiotics before dental procedures. . Pregnancy: If you are pregnant, call and cancel the procedure. . Sickness: If you have a cold, fever, or any active infections, call and cancel the procedure. . Arrival: You must be in the facility at least 30 minutes prior to your scheduled procedure. . Children: Do not bring children with you. . Dress appropriately: Bring dark clothing that you would not mind if they get stained. . Valuables: Do not bring any jewelry or valuables. Procedure appointments are reserved for interventional treatments only. Marland Kitchen No Prescription Refills. . No medication changes will be discussed during procedure  appointments. . No disability issues will be discussed. ____________________________________________________________________________________________  Pain Management Discharge Instructions  General Discharge Instructions :  If you need to reach your doctor call: Monday-Friday 8:00 am - 4:00 pm at [phone] or toll free [phone].  After clinic hours [phone] to have operator reach doctor.  Bring all of your medication bottles to all your appointments in the pain clinic.  To cancel or reschedule your appointment with Pain Management please remember to call 24 hours in advance to avoid a fee.  Refer to the educational materials which you have been given on: General Risks, I had my Procedure. Discharge Instructions, Post Sedation.  Post Procedure Instructions:  The drugs you were given will stay in your system until tomorrow, so for the next 24 hours you should not drive, make any legal decisions or drink any alcoholic beverages.  You may eat anything you prefer, but it is better to start with liquids then soups and crackers, and gradually work up to solid foods.  Please notify your doctor immediately if you have any unusual bleeding, trouble breathing or pain that is not related to your normal pain.  Depending on the type of procedure that was done, some parts of your body may feel week and/or numb.  This usually clears up by tonight or the next day.  Walk with the use of an assistive device or accompanied by an adult for the 24 hours.  You may use ice on the affected area for the first 24 hours.  Put ice in a Ziploc bag and cover with a towel and place against area 15 minutes on 15 minutes off.  You may switch to heat after  24 hours. Facet Joint Block The facet joints connect the bones of the spine (vertebrae). They make it possible for you to bend, twist, and make other movements with your spine. They also keep you from bending too far, twisting too far, and making other  excessive movements. A facet joint block is a procedure where a numbing medicine (anesthetic) is injected into a facet joint. Often, a type of anti-inflammatory medicine called a steroid is also injected. A facet joint block may be done to diagnose neck or back pain. If the pain gets better after a facet joint block, it means the pain is probably coming from the facet joint. If the pain does not get better, it means the pain is probably not coming from the facet joint. A facet joint block may also be done to relieve neck or back pain caused by an inflamed facet joint. A facet joint block is only done to relieve pain if the pain does not improve with other methods, such as medicine, exercise programs, and physical therapy. Tell a health care provider about:  Any allergies you have.  All medicines you are taking, including vitamins, herbs, eye drops, creams, and over-the-counter medicines.  Any problems you or family members have had with anesthetic medicines.  Any blood disorders you have.  Any surgeries you have had.  Any medical conditions you have.  Whether you are pregnant or may be pregnant. What are the risks? Generally, this is a safe procedure. However, problems may occur, including:  Bleeding.  Injury to a nerve near the injection site.  Pain at the injection site.  Weakness or numbness in areas controlled by nerves near the injection site.  Infection.  Temporary fluid retention.  Allergic reactions to medicines or dyes.  Injury to other structures or organs near the injection site.  What happens before the procedure?  Follow instructions from your health care provider about eating or drinking restrictions.  Ask your health care provider about: ? Changing or stopping your regular medicines. This is especially important if you are taking diabetes medicines or blood thinners. ? Taking medicines such as aspirin and ibuprofen. These medicines can thin your blood. Do not  take these medicines before your procedure if your health care provider instructs you not to.  Do not take any new dietary supplements or medicines without asking your health care provider first.  Plan to have someone take you home after the procedure. What happens during the procedure?  You may need to remove your clothing and dress in an open-back gown.  The procedure will be done while you are lying on an X-ray table. You will most likely be asked to lie on your stomach, but you may be asked to lie in a different position if an injection will be made in your neck.  Machines will be used to monitor your oxygen levels, heart rate, and blood pressure.  If an injection will be made in your neck, an IV tube will be inserted into one of your veins. Fluids and medicine will flow directly into your body through the IV tube.  The area over the facet joint where the injection will be made will be cleaned with soap. The surrounding skin will be covered with clean drapes.  A numbing medicine (local anesthetic) will be applied to your skin. Your skin may sting or burn for a moment.  A video X-ray machine (fluoroscopy) will be used to locate the joint. In some cases, a CT scan may be  used.  A contrast dye may be injected into the facet joint area to help locate the joint.  When the joint is located, an anesthetic will be injected into the joint through the needle.  Your health care provider will ask you whether you feel pain relief. If you do feel relief, a steroid may be injected to provide pain relief for a longer period of time. If you do not feel relief or feel only partial relief, additional injections of an anesthetic may be made in other facet joints.  The needle will be removed.  Your skin will be cleaned.  A bandage (dressing) will be applied over each injection site. The procedure may vary among health care providers and hospitals. What happens after the procedure?  You will be  observed for 15-30 minutes before being allowed to go home. This information is not intended to replace advice given to you by your health care provider. Make sure you discuss any questions you have with your health care provider. Document Released: 07/15/2006 Document Revised: 03/27/2015 Document Reviewed: 11/19/2014 Elsevier Interactive Patient Education  2018 [place]  Facet Joint Block, Care After Refer to this sheet in the next few weeks. These instructions provide you with information about caring for yourself after your procedure. Your health care provider may also give you more specific instructions. Your treatment has been planned according to current medical practices, but problems sometimes occur. Call your health care provider if you have any problems or questions after your procedure. What can I expect after the procedure? After the procedure, it is common to have:  Some tenderness over the injection sites for 2 days after the procedure.  A temporary increase in blood sugar if you have diabetes.  Follow these instructions at home:  Keep track of the amount of pain relief you feel and how long it lasts.  Take over-the-counter and prescription medicines only as told by your health care provider. You may need to limit pain medicine within the first 4-6 hours after the procedure.  Remove your bandages (dressings) the morning after the procedure.  For the first 24 hours after the procedure: ? Do not apply heat near or over the injection sites. ? Do not take a bath or soak in water, such as in a pool or lake. ? Do not drive or operate heavy machinery unless approved by your health care provider. ? Avoid activities that require a lot of energy.  If the injection site is tender, try applying ice to the area. To do this: ? Put ice in a plastic bag. ? Place a towel between your skin and the bag. ? Leave the ice on for 20 minutes, 2-3 times a day.  Keep all follow-up visits  as told by your health care provider. This is important. Contact a health care provider if:  Fluid is coming from an injection site.  There is significant bleeding or swelling at an injection site.  You have diabetes and your blood sugar is above 180 mg/dL. Get help right away if:  You have a fever.  You have worsening pain or swelling around an injection site.  There are red streaks around an injection site.  You develop severe pain that is not controlled by your medicines.  You develop a headache, stiff neck, nausea, or vomiting.  Your eyes become very sensitive to light.  You have weakness, paralysis, or tingling in your arms or legs that was not present before the procedure.  You have difficulty  urinating or breathing. This information is not intended to replace advice given to you by your health care provider. Make sure you discuss any questions you have with your health care provider. Document Released: 02/10/2012 Document Revised: 07/10/2015 Document Reviewed: 11/19/2014 Elsevier Interactive Patient Education  2018 [place]. Pain Management Discharge Instructions  General Discharge Instructions :  If you need to reach your doctor call: Monday-Friday 8:00 am - 4:00 pm at [phone] or toll free [phone].  After clinic hours [phone] to have operator reach doctor.  Bring all of your medication bottles to all your appointments in the pain clinic.  To cancel or reschedule your appointment with Pain Management please remember to call 24 hours in advance to avoid a fee.  Refer to the educational materials which you have been given on: General Risks, I had my Procedure. Discharge Instructions, Post Sedation.  Post Procedure Instructions:  The drugs you were given will stay in your system until tomorrow, so for the next 24 hours you should not drive, make any legal decisions or drink any alcoholic beverages.  You may eat anything you prefer, but it is  better to start with liquids then soups and crackers, and gradually work up to solid foods.  Please notify your doctor immediately if you have any unusual bleeding, trouble breathing or pain that is not related to your normal pain.  Depending on the type of procedure that was done, some parts of your body may feel week and/or numb.  This usually clears up by tonight or the next day.  Walk with the use of an assistive device or accompanied by an adult for the 24 hours.  You may use ice on the affected area for the first 24 hours.  Put ice in a Ziploc bag and cover with a towel and place against area 15 minutes on 15 minutes off.  You may switch to heat after 24 hours.

## 2016-11-04 NOTE — Progress Notes (Signed)
Safety precautions to be maintained throughout the outpatient stay will include: orient to surroundings, keep bed in low position, maintain call bell within reach at all times, provide assistance with transfer out of bed and ambulation.  

## 2016-11-26 ENCOUNTER — Ambulatory Visit (HOSPITAL_BASED_OUTPATIENT_CLINIC_OR_DEPARTMENT_OTHER): Payer: Medicare Other | Admitting: Pain Medicine

## 2016-11-26 ENCOUNTER — Ambulatory Visit
Admission: RE | Admit: 2016-11-26 | Discharge: 2016-11-26 | Disposition: A | Discharge status: Home / Self Care | Payer: Medicare Other | Source: Ambulatory Visit | Attending: Pain Medicine | Admitting: Pain Medicine

## 2016-11-26 ENCOUNTER — Encounter: Payer: Self-pay | Admitting: Pain Medicine

## 2016-11-26 VITALS — BP 156/86 | HR 66 | Temp 97.9°F | Resp 16 | Ht 69.0 in | Wt 220.0 lb

## 2016-11-26 DIAGNOSIS — M5441 Lumbago with sciatica, right side: Secondary | ICD-10-CM

## 2016-11-26 DIAGNOSIS — G8929 Other chronic pain: Secondary | ICD-10-CM | POA: Insufficient documentation

## 2016-11-26 DIAGNOSIS — M4696 Unspecified inflammatory spondylopathy, lumbar region: Secondary | ICD-10-CM | POA: Diagnosis not present

## 2016-11-26 DIAGNOSIS — M545 Low back pain: Secondary | ICD-10-CM | POA: Diagnosis present

## 2016-11-26 DIAGNOSIS — G545 Neuralgic amyotrophy: Secondary | ICD-10-CM | POA: Diagnosis not present

## 2016-11-26 DIAGNOSIS — M5442 Lumbago with sciatica, left side: Secondary | ICD-10-CM

## 2016-11-26 DIAGNOSIS — M47816 Spondylosis without myelopathy or radiculopathy, lumbar region: Secondary | ICD-10-CM

## 2016-11-26 MED ORDER — MIDAZOLAM HCL 5 MG/5ML IJ SOLN: 1.0000 mg | INTRAMUSCULAR | Status: DC | PRN

## 2016-11-26 MED ORDER — TRIAMCINOLONE ACETONIDE 40 MG/ML IJ SUSP
40.0000 mg | Freq: Once | INTRAMUSCULAR | Status: AC
  Administered 2016-11-26: 40 mg
  Filled 2016-11-26: qty 1

## 2016-11-26 MED ORDER — LIDOCAINE HCL 2 % IJ SOLN
10.0000 mL | Freq: Once | INTRAMUSCULAR | Status: AC
  Administered 2016-11-26: 400 mg

## 2016-11-26 MED ORDER — ROPIVACAINE HCL 2 MG/ML IJ SOLN
9.0000 mL | Freq: Once | INTRAMUSCULAR | Status: AC
  Administered 2016-11-26: 9 mL via PERINEURAL
  Filled 2016-11-26: qty 10

## 2016-11-26 MED ORDER — FENTANYL CITRATE (PF) 100 MCG/2ML IJ SOLN: 25.0000 ug | INTRAMUSCULAR | Status: DC | PRN

## 2016-11-26 MED ORDER — LACTATED RINGERS IV SOLN: 1000.0000 mL | Freq: Once | INTRAVENOUS | Status: DC

## 2016-11-26 NOTE — Progress Notes (Signed)
Patient's Name: Joshua Humphrey  MRN: [id number]  Referring Provider: Kirk Ruths, MD  DOB: 1929-12-07  PCP: Kirk Ruths, MD  DOS: 11/26/2016  Note by: Gaspar Cola, MD  Service setting: Ambulatory outpatient  Specialty: Interventional Pain Management  Patient type: Established  Location: [facility] (AMB) Pain Management Facility  Visit type: Interventional Procedure   Primary Reason for Visit: Interventional Pain Management Treatment. CC: Back Pain (lower bilateral)  Procedure:  Anesthesia, Analgesia, Anxiolysis:  Type: Diagnostic Medial Branch Facet Block Region: Lumbar Level: L2, L3, L4, L5, & S1 Medial Branch Level(s) Laterality: Bilateral  Type: Local Anesthesia Local Anesthetic: Lidocaine 1% Route: Infiltration (Hulbert/IM) IV Access: Declined Sedation: Declined  Indication(s): Analgesia          Indications: 1. Lumbar facet syndrome (Bilateral)   2. Osteoarthritis of lumbar spine   3. Chronic low back pain (Primary Area of Pain) (Bilateral) (R>L)    Pain Score: Pre-procedure: 4 /10 Post-procedure: 0-No pain/10  Pre-op Assessment:  Joshua Humphrey is a 83 y.o. (year old), male patient, seen today for interventional treatment. He  has a past surgical history that includes tumor removed (2000); Prostate surgery (2002); lipoma removal (2000); Tonsillectomy; Cataract extraction, bilateral; Toe Surgery; Olecranon bursectomy (Left, 01/21/2016); Elbow surgery; and Eye surgery. Joshua Humphrey has a current medication list which includes the following prescription(s): acetaminophen, aspirin ec, atorvastatin, calcium carbonate-vitamin d, carvedilol, cetirizine, ferrous fumarate, flaxseed (linseed), fluticasone, furosemide, gabapentin, glucosamine-chondroitin, hydralazine, ipratropium-albuterol, levemir flextouch, losartan, metformin, mometasone-formoterol, montelukast, ocuvite adult 50+, nifedipine, omeprazole, potassium chloride sa, pramipexole, and sitagliptin. His primarily concern today  is the Back Pain (lower bilateral)  Initial Vital Signs: There were no vitals taken for this visit. BMI: Estimated body mass index is 32.49 kg/m as calculated from the following:   Height as of this encounter: 5\' 9"  (1.753 m).   Weight as of this encounter: 220 lb (99.8 kg).  Risk Assessment: Allergies: Reviewed. He has No Known Allergies.  Allergy Precautions: None required Coagulopathies: Reviewed. None identified.  Blood-thinner therapy: None at this time Active Infection(s): Reviewed. None identified. Joshua Humphrey is afebrile  Site Confirmation: Joshua Humphrey was asked to confirm the procedure and laterality before marking the site Procedure checklist: Completed Consent: Before the procedure and under the influence of no sedative(s), amnesic(s), or anxiolytics, the patient was informed of the treatment options, risks and possible complications. To fulfill our ethical and legal obligations, as recommended by the American Medical Association's Code of Ethics, I have informed the patient of my clinical impression; the nature and purpose of the treatment or procedure; the risks, benefits, and possible complications of the intervention; the alternatives, including doing nothing; the risk(s) and benefit(s) of the alternative treatment(s) or procedure(s); and the risk(s) and benefit(s) of doing nothing. The patient was provided information about the general risks and possible complications associated with the procedure. These may include, but are not limited to: failure to achieve desired goals, infection, bleeding, organ or nerve damage, allergic reactions, paralysis, and death. In addition, the patient was informed of those risks and complications associated to Spine-related procedures, such as failure to decrease pain; infection (i.e.: Meningitis, epidural or intraspinal abscess); bleeding (i.e.: epidural hematoma, subarachnoid hemorrhage, or any other type of intraspinal or peri-dural bleeding); organ  or nerve damage (i.e.: Any type of peripheral nerve, nerve root, or spinal cord injury) with subsequent damage to sensory, motor, and/or autonomic systems, resulting in permanent pain, numbness, and/or weakness of one or several areas of the body; allergic reactions; (i.e.:  anaphylactic reaction); and/or death. Furthermore, the patient was informed of those risks and complications associated with the medications. These include, but are not limited to: allergic reactions (i.e.: anaphylactic or anaphylactoid reaction(s)); adrenal axis suppression; blood sugar elevation that in diabetics may result in ketoacidosis or comma; water retention that in patients with history of congestive heart failure may result in shortness of breath, pulmonary edema, and decompensation with resultant heart failure; weight gain; swelling or edema; medication-induced neural toxicity; particulate matter embolism and blood vessel occlusion with resultant organ, and/or nervous system infarction; and/or aseptic necrosis of one or more joints. Finally, the patient was informed that Medicine is not an exact science; therefore, there is also the possibility of unforeseen or unpredictable risks and/or possible complications that may result in a catastrophic outcome. The patient indicated having understood very clearly. We have given the patient no guarantees and we have made no promises. Enough time was given to the patient to ask questions, all of which were answered to the patient's satisfaction. Joshua Humphrey has indicated that he wanted to continue with the procedure. Attestation: I, the ordering provider, attest that I have discussed with the patient the benefits, risks, side-effects, alternatives, likelihood of achieving goals, and potential problems during recovery for the procedure that I have provided informed consent. Date: 11/26/2016; Time: 7:12 AM  Pre-Procedure Preparation:  Monitoring: As per clinic protocol. Respiration, ETCO2,  SpO2, BP, heart rate and rhythm monitor placed and checked for adequate function Safety Precautions: Patient was assessed for positional comfort and pressure points before starting the procedure. Time-out: I initiated and conducted the "Time-out" before starting the procedure, as per protocol. The patient was asked to participate by confirming the accuracy of the "Time Out" information. Verification of the correct person, site, and procedure were performed and confirmed by me, the nursing staff, and the patient. "Time-out" conducted as per Joint Commission's Universal Protocol (UP.01.01.01). "Time-out" Date & Time: 11/26/2016; 1034 hrs.  Description of Procedure Process:   Position: Prone Target Area: For Lumbar Facet blocks, the target is the groove formed by the junction of the transverse process and superior articular process. For the L5 dorsal ramus, the target is the notch between superior articular process and sacral ala. For the S1 dorsal ramus, the target is the superior and lateral edge of the posterior S1 Sacral foramen. Approach: Paramedial approach. Area Prepped: Entire Posterior Lumbosacral Region Prepping solution: ChloraPrep (2% chlorhexidine gluconate and 70% isopropyl alcohol) Safety Precautions: Aspiration looking for blood return was conducted prior to all injections. At no point did we inject any substances, as a needle was being advanced. No attempts were made at seeking any paresthesias. Safe injection practices and needle disposal techniques used. Medications properly checked for expiration dates. SDV (single dose vial) medications used. Description of the Procedure: Protocol guidelines were followed. The patient was placed in position over the fluoroscopy table. The target area was identified and the area prepped in the usual manner. Skin desensitized using vapocoolant spray. Skin & deeper tissues infiltrated with local anesthetic. Appropriate amount of time allowed to pass for  local anesthetics to take effect. The procedure needle was introduced through the skin, ipsilateral to the reported pain, and advanced to the target area. Employing the "Medial Branch Technique", the needles were advanced to the angle made by the superior and medial portion of the transverse process, and the lateral and inferior portion of the superior articulating process of the targeted vertebral bodies. This area is known as "Burton's Eye" or the "Eye  of the [place] Dog". A procedure needle was introduced through the skin, and this time advanced to the angle made by the superior and medial border of the sacral ala, and the lateral border of the S1 vertebral body. This last needle was later repositioned at the superior and lateral border of the posterior S1 foramen. Negative aspiration confirmed. Solution injected in intermittent fashion, asking for systemic symptoms every 0.5cc of injectate. The needles were then removed and the area cleansed, making sure to leave some of the prepping solution back to take advantage of its long term bactericidal properties.   Illustration of the posterior view of the lumbar spine and the posterior neural structures. Laminae of L2 through S1 are labeled. DPRL5, dorsal primary ramus of L5; DPRS1, dorsal primary ramus of S1; DPR3, dorsal primary ramus of L3; FJ, facet (zygapophyseal) joint L3-L4; I, inferior articular process of L4; LB1, lateral branch of dorsal primary ramus of L1; IAB, inferior articular branches from L3 medial branch (supplies L4-L5 facet joint); IBP, intermediate branch plexus; MB3, medial branch of dorsal primary ramus of L3; NR3, third lumbar nerve root; S, superior articular process of L5; SAB, superior articular branches from L4 (supplies L4-5 facet joint also); TP3, transverse process of L3.  Vitals:   11/26/16 1035 11/26/16 1045 11/26/16 1050 11/26/16 1100  BP: (!) 177/89 (!) 169/89 (!) 155/85 (!) 156/86  Pulse: 67 67 67 66  Resp: 18 15 16 16     Temp:      TempSrc:      SpO2: 96% 95% 96% 97%  Weight:      Height:        Start Time: 1034 hrs. End Time: 1050 hrs. Materials:  Needle(s) Type: Regular needle Gauge: 25G Length: 3.5-in Medication(s): We administered lidocaine, triamcinolone acetonide, ropivacaine (PF) 2 mg/mL (0.2%), triamcinolone acetonide, and ropivacaine (PF) 2 mg/mL (0.2%). Please see chart orders for dosing details.  Imaging Guidance (Spinal):  Type of Imaging Technique: Fluoroscopy Guidance (Spinal) Indication(s): Assistance in needle guidance and placement for procedures requiring needle placement in or near specific anatomical locations not easily accessible without such assistance. Exposure Time: Please see nurses notes. Contrast: None used. Fluoroscopic Guidance: I was personally present during the use of fluoroscopy. "Tunnel Vision Technique" used to obtain the best possible view of the target area. Parallax error corrected before commencing the procedure. "Direction-depth-direction" technique used to introduce the needle under continuous pulsed fluoroscopy. Once target was reached, antero-posterior, oblique, and lateral fluoroscopic projection used confirm needle placement in all planes. Images permanently stored in EMR. Interpretation: No contrast injected. I personally interpreted the imaging intraoperatively. Adequate needle placement confirmed in multiple planes. Permanent images saved into the patient's record.  Antibiotic Prophylaxis:  Indication(s): None identified Antibiotic given: None  Post-operative Assessment:  EBL: None Complications: No immediate post-treatment complications observed by team, or reported by patient. Note: The patient tolerated the entire procedure well. A repeat set of vitals were taken after the procedure and the patient was kept under observation following institutional policy, for this type of procedure. Post-procedural neurological assessment was performed, showing  return to baseline, prior to discharge. The patient was provided with post-procedure discharge instructions, including a section on how to identify potential problems. Should any problems arise concerning this procedure, the patient was given instructions to immediately contact us, at any time, without hesitation. In any case, we plan to contact the patient by telephone for a follow-up status report regarding this interventional procedure. Comments:  No additional relevant information.  Plan  of Care  Disposition: Discharge home  Discharge Date & Time: 11/26/2016; 1059 hrs.   Physician-requested Follow-up:  Return for post-procedure eval by Dr. Dossie Arbour in 2 weeks.  Future Appointments Date Time Provider [place]  12/09/2016 10:45 AM Milinda Pointer, MD [facility] None  01/04/2017 1:00 PM Milinda Pointer, MD [facility] None  04/06/2017 2:45 PM [facility] None  04/08/2017 1:45 PM [facility] None    Imaging Orders     DG C-Arm 1-60 Min-No Report  Procedure Orders     LUMBAR FACET(MEDIAL BRANCH NERVE BLOCK) MBNB  Medications ordered for procedure: Meds ordered this encounter  Medications  . lidocaine (XYLOCAINE) 2 % (with pres) injection 200 mg  . triamcinolone acetonide (KENALOG-40) injection 40 mg  . ropivacaine (PF) 2 mg/mL (0.2%) (NAROPIN) injection 9 mL  . triamcinolone acetonide (KENALOG-40) injection 40 mg  . ropivacaine (PF) 2 mg/mL (0.2%) (NAROPIN) injection 9 mL   Medications administered: We administered lidocaine, triamcinolone acetonide, ropivacaine (PF) 2 mg/mL (0.2%), triamcinolone acetonide, and ropivacaine (PF) 2 mg/mL (0.2%).  See the medical record for exact dosing, route, and time of administration.  New Prescriptions   No medications on file   Primary Care Physician: Kirk Ruths, MD Location: [facility] Out[facility] Note by: Gaspar Cola, MD Date: 11/26/2016; Time: 11:17 AM  Disclaimer:   Medicine is not an exact science. The only guarantee in medicine is that nothing is guaranteed. It is important to note that the decision to proceed with this intervention was based on the information collected from the patient. The Data and conclusions were drawn from the patient's questionnaire, the interview, and the physical examination. Because the information was provided in large part by the patient, it cannot be guaranteed that it has not been purposely or unconsciously manipulated. Every effort has been made to obtain as much relevant data as possible for this evaluation. It is important to note that the conclusions that lead to this procedure are derived in large part from the available data. Always take into account that the treatment will also be dependent on availability of resources and existing treatment guidelines, considered by other Pain Management Practitioners as being common knowledge and practice, at the time of the intervention. For Medico-Legal purposes, it is also important to point out that variation in procedural techniques and pharmacological choices are the acceptable norm. The indications, contraindications, technique, and results of the above procedure should only be interpreted and judged by a Board-Certified Interventional Pain Specialist with extensive familiarity and expertise in the same exact procedure and technique.

## 2016-11-26 NOTE — Patient Instructions (Addendum)
____________________________________________________________________________________________  Post-Procedure instructions Instructions:  Apply ice: Fill a plastic sandwich bag with crushed ice. Cover it with a small towel and apply to injection site. Apply for 15 minutes then remove x 15 minutes. Repeat sequence on day of procedure, until you go to bed. The purpose is to minimize swelling and discomfort after procedure.  Apply heat: Apply heat to procedure site starting the day following the procedure. The purpose is to treat any soreness and discomfort from the procedure.  Food intake: Start with clear liquids (like water) and advance to regular food, as tolerated.   Physical activities: Keep activities to a minimum for the first 8 hours after the procedure.   Driving: If you have received any sedation, you are not allowed to drive for 24 hours after your procedure.  Blood thinner: Restart your blood thinner 6 hours after your procedure. (Only for those taking blood thinners)  Insulin: As soon as you can eat, you may resume your normal dosing schedule. (Only for those taking insulin)  Infection prevention: Keep procedure site clean and dry.  Post-procedure Pain Diary: Extremely important that this be done correctly and accurately. Recorded information will be used to determine the next step in treatment.  Pain evaluated is that of treated area only. Do not include pain from an untreated area.  Complete every hour, on the hour, for the initial 8 hours. Set an alarm to help you do this part accurately.  Do not go to sleep and have it completed later. It will not be accurate.  Follow-up appointment: Keep your follow-up appointment after the procedure. Usually 2 weeks for most procedures. (6 weeks in the case of radiofrequency.) Bring you pain diary.  Expect:  From numbing medicine (AKA: Local Anesthetics): Numbness or decrease in pain.  Onset: Full effect within 15 minutes of  injected.  Duration: It will depend on the type of local anesthetic used. On the average, 1 to 8 hours.   From steroids: Decrease in swelling or inflammation. Once inflammation is improved, relief of the pain will follow.  Onset of benefits: Depends on the amount of swelling present. The more swelling, the longer it will take for the benefits to be seen. In some cases, up to 10 days.  Duration: Steroids will stay in the system x 2 weeks. Duration of benefits will depend on multiple posibilities including persistent irritating factors.  From procedure: Some discomfort is to be expected once the numbing medicine wears off. This should be minimal if ice and heat are applied as instructed. Call if:  You experience numbness and weakness that gets worse with time, as opposed to wearing off.  New onset bowel or bladder incontinence. (Spinal procedures only)  Emergency Numbers:  Durning business hours (Monday - Thursday, 8:00 AM - 4:00 PM) (Friday, 9:00 AM - 12:00 Noon): [phone]  After hours: [phone] ____________________________________________________________________________________________  Pain Management Discharge Instructions  General Discharge Instructions :  If you need to reach your doctor call: Monday-Friday 8:00 am - 4:00 pm at [phone] or toll free [phone].  After clinic hours [phone] to have operator reach doctor.  Bring all of your medication bottles to all your appointments in the pain clinic.  To cancel or reschedule your appointment with Pain Management please remember to call 24 hours in advance to avoid a fee.  Refer to the educational materials which you have been given on: General Risks, I had my Procedure. Discharge Instructions, Post Sedation.  Post Procedure Instructions:  The drugs you   were given will stay in your system until tomorrow, so for the next 24 hours you should not drive, make any legal decisions or drink any alcoholic  beverages.  You may eat anything you prefer, but it is better to start with liquids then soups and crackers, and gradually work up to solid foods.  Please notify your doctor immediately if you have any unusual bleeding, trouble breathing or pain that is not related to your normal pain.  Depending on the type of procedure that was done, some parts of your body may feel week and/or numb.  This usually clears up by tonight or the next day.  Walk with the use of an assistive device or accompanied by an adult for the 24 hours.  You may use ice on the affected area for the first 24 hours.  Put ice in a Ziploc bag and cover with a towel and place against area 15 minutes on 15 minutes off.  You may switch to heat after 24 hours. 

## 2016-11-27 ENCOUNTER — Telehealth: Payer: Self-pay

## 2016-11-27 NOTE — Telephone Encounter (Signed)
Post procedure phone call.  Left message.  

## 2016-12-08 NOTE — Progress Notes (Signed)
Patient's Name: Joshua Humphrey  MRN: [id number]  Referring Provider: Kirk Ruths, MD  DOB: November 29, 1929  PCP: Kirk Ruths, MD  DOS: 12/09/2016  Note by: Gaspar Cola, MD  Service setting: Ambulatory outpatient  Specialty: Interventional Pain Management  Location: [facility] (AMB) Pain Management Facility    Patient type: Established   Primary Reason(s) for Visit: Encounter for post-procedure evaluation of chronic illness with mild to moderate exacerbation CC: No chief complaint on file.  HPI  [name] is a 83 y.o. year old, male patient, who comes today for a post-procedure evaluation. 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[place]); Chronic pain syndrome; Long term (current) use of opiate analgesic; Long term prescription opiate use; Opiate use (30 MME/Day); Chronic low back pain (Primary Area of Pain) (Bilateral) (R>L); Chronic lower extremity pain (Tertiary source of pain) (Bilateral) (L>R); Abnormal MRI, lumbar spine; Abnormal MRI, cervical spine; Lumbar spinal stenosis with neurogenic claudication; Disturbance of skin sensation; Chronic hip pain (Secondary Area of Pain) (Left); Chronic knee pain (Bilateral) (L>R); Generalized weakness; Muscle weakness (generalized); Chronic Lumbar radicular pain (Left) (L5); Neurogenic pain; Osteoarthritis; Lumbar foraminal stenosis (Right: L4-5 and L5-S1) (Left: L2-3, L3-4, and L4-5); Osteoarthritis of hip (Left); Chronic hip arthralgia (Left); Chronic  arthralgias of knees and hips (Left); Vitamin D insufficiency; Cervical spinal stenosis; Cervical foraminal stenosis; Lumbar facet syndrome (Bilateral); and Osteoarthritis of lumbar spine on his problem list. His primarily concern today is the No chief complaint on file.  Pain Assessment: Location: Left Hip Radiating: stays in the left hip Onset: More than a month ago Duration: Chronic pain Quality: Aching, Constant Severity: 4 /10 (self-reported pain score)  Note: Reported level is compatible with observation.                   When using our objective Pain Scale, levels between 6 and 10/10 are said to belong in an emergency room, as it progressively worsens from a 6/10, described as severely limiting, requiring emergency care not usually available at an outpatient pain management facility. At a 6/10 level, communication becomes difficult and requires great effort. Assistance to reach the emergency department may be required. Facial flushing and profuse sweating along with potentially dangerous increases in heart rate and blood pressure will be evident. Effect on ADL: pace self Timing: Constant Modifying factors: procedure and medication (tylenol arthritis)  [name] comes in today for post-procedure evaluation after the treatment done on 11/26/2016.  Further details on both, my assessment(s), as well as the proposed treatment plan, please see below.  Post-Procedure Assessment  11/26/2016 Procedure: Diagnostic bilateral lumbar facet block under fluoroscopic guidance, without sedation Pre-procedure pain score:  4/10 Post-procedure pain score: 0/10 (100% relief) Influential Factors: BMI: 31.75 kg/m Intra-procedural challenges: None observed.         Assessment challenges: None detected.              Reported side-effects: None.        Post-procedural adverse reactions or complications: None reported         Sedation: No sedation used. When no sedatives are used, the analgesic levels  obtained are directly associated to the effectiveness  of the local anesthetics. However, when sedation is provided, the level of analgesia obtained during the initial 1 hour following the intervention, is believed to be the result of a combination of factors. These factors may include, but are not limited to: 1. The effectiveness of the local anesthetics used. 2. The effects of the analgesic(s) and/or anxiolytic(s) used. 3. The degree of discomfort experienced by the patient at the time of the procedure. 4. The patients ability and reliability in recalling and recording the events. 5. The presence and influence of possible secondary gains and/or psychosocial factors. Reported result: Relief experienced during the 1st hour after the procedure: 100 % (Ultra-Short Term Relief) [name] has indicated area to have been numb during this time. Interpretative annotation: Clinically appropriate result. No IV Analgesic or Anxiolytic given, therefore benefits are completely due to Local Anesthetic effects. Such findings would suggest that 100% of the pain, in the treated area, is coming from the blocked structure.  Effects of local anesthetic: The analgesic effects attained during this period are directly associated to the localized infiltration of local anesthetics and therefore cary significant diagnostic value as to the etiological location, or anatomical origin, of the pain. Expected duration of relief is directly dependent on the pharmacodynamics of the local anesthetic used. Long-acting (4-6 hours) anesthetics used.  Reported result: Relief during the next 4 to 6 hour after the procedure: 100 % (Short-Term Relief) [name] has indicated area to have been numb during this time. Interpretative annotation: Clinically appropriate result. Analgesia during this period is likely to be Local Anesthetic-related.          Long-term benefit: Defined as the period of time past the expected duration of local  anesthetics (1 hour for short-acting and 4-6 hours for long-acting). With the possible exception of prolonged sympathetic blockade from the local anesthetics, benefits during this period are typically attributed to, or associated with, other factors such as analgesic sensory neuropraxia, antiinflammatory effects, or beneficial biochemical changes provided by agents other than the local anesthetics.  Reported result: Extended relief following procedure: 100 % (Long-Term Relief) Mr. Zuckerman reports the axial pain improved more than the extremity pain. Interpretative annotation: Clinically appropriate result. Good relief. No permanent benefit expected. Inflammation plays a part in the etiology to the pain. Benefit believed to be steroid-related.  Current benefits: Defined as reported results that persistent at this point in time.   Analgesia: 100 % Mr. Harb reports improvement of axial symptoms. Function: Mr. Wence reports improvement in function ROM: Mr. Fier reports improvement in ROM Interpretative annotation: Recurrence of symptoms. No permanent benefit expected. Effective therapeutic approach. Benefit could be steroid-related.  Interpretation: Results would suggest a successful diagnostic and therapeutic intervention.                  Plan:  Set up procedure as a PRN palliative treatment option for this patient.       Laboratory Chemistry  Inflammation Markers (CRP: Acute Phase) (ESR: Chronic Phase) Lab Results  Component Value Date   CRP 0.7 09/08/2016   ESRSEDRATE 16 09/08/2016                 Renal Function Markers Lab Results  Component Value Date   BUN 20 09/08/2016   CREATININE 0.75 (L) 09/08/2016   GFRAA 97 09/08/2016   GFRNONAA 84 09/08/2016                 Hepatic Function Markers Lab Results  Component Value Date  AST 17 09/08/2016   ALT 15 09/08/2016   ALBUMIN 4.2 09/08/2016   ALKPHOS 64 09/08/2016                 Electrolytes Lab Results  Component Value Date    NA 144 09/08/2016   K 3.8 09/08/2016   CL 105 09/08/2016   CALCIUM 9.2 09/08/2016   MG 1.8 09/08/2016                 Neuropathy Markers Lab Results  Component Value Date   VITAMINB12 264 09/08/2016                 Bone Pathology Markers Lab Results  Component Value Date   ALKPHOS 64 09/08/2016   25OHVITD1 41 09/08/2016   25OHVITD2 <1.0 09/08/2016   25OHVITD3 41 09/08/2016   CALCIUM 9.2 09/08/2016                 Coagulation Parameters Lab Results  Component Value Date   PLT 275 01/20/2016                 Cardiovascular Markers Lab Results  Component Value Date   HGB 12.1 (L) 01/20/2016   HCT 36.3 (L) 01/20/2016                 Note: Lab results reviewed.  Recent Diagnostic Imaging Results  DG C-Arm 1-60 Min-No Report Fluoroscopy was utilized by the requesting physician.  No radiographic  interpretation.   Note: Imaging results reviewed.        Meds   Current Outpatient Prescriptions:  .  acetaminophen (TYLENOL) 650 MG CR tablet, Take 650-1,300 mg by mouth every 8 (eight) hours as needed (for pain.). , Disp: , Rfl:  .  aspirin EC 81 MG tablet, Take 81 mg by mouth daily. , Disp: , Rfl:  .  atorvastatin (LIPITOR) 40 MG tablet, Take 40 mg by mouth daily. , Disp: , Rfl:  .  Calcium Carbonate-Vitamin D (CALTRATE 600+D PO), Take 2 tablets by mouth daily., Disp: , Rfl:  .  carvedilol (COREG) 25 MG tablet, TAKE 1 TABLET (25 MG) TWICE DAILY, Disp: , Rfl:  .  cetirizine (ZYRTEC) 10 MG tablet, Take 10 mg by mouth every evening. , Disp: , Rfl:  .  ferrous fumarate (HEMOCYTE - 106 MG FE) 325 (106 FE) MG TABS tablet, Take 1 tablet by mouth 2 (two) times daily. , Disp: , Rfl:  .  FLAXSEED, LINSEED, PO, Take by mouth daily., Disp: , Rfl:  .  fluticasone (FLONASE) 50 MCG/ACT nasal spray, Place 1-2 sprays into both nostrils daily as needed (for allergies.). , Disp: , Rfl:  .  furosemide (LASIX) 40 MG tablet, TAKE 1 TABLET BY MOUTH TWICE DAILY, Disp: , Rfl:  .  gabapentin  (NEURONTIN) 300 MG capsule, Take 1-3 capsules (300-900 mg total) by mouth 4 (four) times daily. Follow titration schedule., Disp: 360 capsule, Rfl: 2 .  Glucosamine-Chondroit-Vit C-Mn (GLUCOSAMINE-CHONDROITIN) CAPS, Take 2 capsules by mouth daily., Disp: , Rfl:  .  hydrALAZINE (APRESOLINE) 50 MG tablet, Take 50 mg by mouth 3 (three) times daily. , Disp: , Rfl:  .  Ipratropium-Albuterol (COMBIVENT RESPIMAT) 20-100 MCG/ACT AERS respimat, INHALE 2 PUFF FOUR TIMES DAILY AS NEEDED FOR SHORTNESS OF BREATH AND/OR WHEEZING., Disp: , Rfl:  .  LEVEMIR FLEXTOUCH 100 UNIT/ML Pen, Inject 15 Units into the skin every evening. , Disp: , Rfl:  .  losartan (COZAAR) 100 MG tablet, Take 100 mg by mouth daily. ,  Disp: , Rfl:  .  metFORMIN (GLUCOPHAGE) 850 MG tablet, Take 850 mg by mouth 2 (two) times daily. , Disp: , Rfl:  .  mometasone-formoterol (DULERA) 200-5 MCG/ACT AERO, Inhale 2 puffs into the lungs 2 (two) times daily. , Disp: , Rfl:  .  montelukast (SINGULAIR) 10 MG tablet, Take 10 mg by mouth at bedtime., Disp: , Rfl:  .  Multiple Vitamins-Minerals (OCUVITE ADULT 50+) CAPS, Take 1 capsule by mouth daily. , Disp: , Rfl:  .  NIFEdipine (PROCARDIA XL/ADALAT-CC) 90 MG 24 hr tablet, Take 90 mg by mouth daily. , Disp: , Rfl:  .  omeprazole (PRILOSEC) 20 MG capsule, Take 20 mg by mouth daily., Disp: , Rfl:  .  potassium chloride SA (K-DUR,KLOR-CON) 20 MEQ tablet, TAKE 1 TABLET(20 MEQ) BY MOUTH TWICE DAILY, Disp: , Rfl:  .  pramipexole (MIRAPEX) 0.25 MG tablet, Take 0.25 mg by mouth at bedtime as needed (for restless leg syndrome). , Disp: , Rfl:  .  sitaGLIPtin (JANUVIA) 100 MG tablet, Take 100 mg by mouth daily., Disp: , Rfl:   ROS  Constitutional: Denies any fever or chills Gastrointestinal: No reported hemesis, hematochezia, vomiting, or acute GI distress Musculoskeletal: Denies any acute onset joint swelling, redness, loss of ROM, or weakness Neurological: No reported episodes of acute onset apraxia, aphasia,  dysarthria, agnosia, amnesia, paralysis, loss of coordination, or loss of consciousness  Allergies  Mr. Brummitt has No Known Allergies.  [place]  Drug: Mr. Lafoy  reports that he does not use drugs. Alcohol:  reports that he drinks alcohol. Tobacco:  reports that he has never smoked. He has never used smokeless tobacco. Medical:  has a past medical history of Anemia; Asthma; Cancer ([place]); Cancer ([place]); Diabetes mellitus without complication ([place]); GERD (gastroesophageal reflux disease); Hypertension; PONV (postoperative nausea and vomiting); Septic olecranon bursitis of left elbow (01/22/2016); and Sleep apnea. Surgical: Mr. Venard  has a past surgical history that includes tumor removed (2000); Prostate surgery (2002); lipoma removal (2000); Tonsillectomy; Cataract extraction, bilateral; Toe Surgery; Olecranon bursectomy (Left, 01/21/2016); Elbow surgery; and Eye surgery. Family: family history includes Alcohol abuse in his father; Cancer in his brother and mother.  Constitutional Exam  General appearance: Well nourished, well developed, and well hydrated. In no apparent acute distress Vitals:   12/09/16 1058  BP: 140/67  Pulse: 66  Resp: 16  Temp: 98 F (36.7 C)  SpO2: 96%  Weight: 215 lb (97.5 kg)  Height: _0  (1.753 m)   BMI Assessment: Estimated body mass index is 31.75 kg/m as calculated from the following:   Height as of this encounter: _1  (1.753 m).   Weight as of this encounter: 215 lb (97.5 kg).  BMI interpretation table: BMI level Category Range association with higher incidence of chronic pain  <18 kg/m2 Underweight   18.5-24.9 kg/m2 Ideal body weight   25-29.9 kg/m2 Overweight Increased incidence by 20%  30-34.9 kg/m2 Obese (Class I) Increased incidence by 68%  35-39.9 kg/m2 Severe obesity (Class II) Increased incidence by 136%  >40 kg/m2 Extreme obesity (Class III) Increased incidence by 254%   BMI Readings from Last 4 Encounters:  12/09/16 31.75 kg/m  11/26/16  32.49 kg/m  11/04/16 32.49 kg/m  10/15/16 32.49 kg/m   Wt Readings from Last 4 Encounters:  12/09/16 215 lb (97.5 kg)  11/26/16 220 lb (99.8 kg)  11/04/16 220 lb (99.8 kg)  10/15/16 220 lb (99.8 kg)  Psych/Mental status: Alert, oriented x 3 (person, place, & time)  Eyes: PERLA Respiratory: No evidence of acute respiratory distress  Cervical Spine Area Exam  Skin & Axial Inspection: No masses, redness, edema, swelling, or associated skin lesions Alignment: Symmetrical Functional ROM: Unrestricted ROM      Stability: No instability detected Muscle Tone/Strength: Functionally intact. No obvious neuro-muscular anomalies detected. Sensory (Neurological): Unimpaired Palpation: No palpable anomalies              Upper Extremity (UE) Exam    Side: Right upper extremity  Side: Left upper extremity  Skin & Extremity Inspection: Skin color, temperature, and hair growth are WNL. No peripheral edema or cyanosis. No masses, redness, swelling, asymmetry, or associated skin lesions. No contractures.  Skin & Extremity Inspection: Skin color, temperature, and hair growth are WNL. No peripheral edema or cyanosis. No masses, redness, swelling, asymmetry, or associated skin lesions. No contractures.  Functional ROM: Unrestricted ROM          Functional ROM: Unrestricted ROM          Muscle Tone/Strength: Functionally intact. No obvious neuro-muscular anomalies detected.  Muscle Tone/Strength: Functionally intact. No obvious neuro-muscular anomalies detected.  Sensory (Neurological): Unimpaired          Sensory (Neurological): Unimpaired          Palpation: No palpable anomalies              Palpation: No palpable anomalies              Specialized Test(s): Deferred         Specialized Test(s): Deferred          Thoracic Spine Area Exam  Skin & Axial Inspection: No masses, redness, or swelling Alignment: Symmetrical Functional ROM: Unrestricted ROM Stability: No instability detected Muscle  Tone/Strength: Functionally intact. No obvious neuro-muscular anomalies detected. Sensory (Neurological): Unimpaired Muscle strength & Tone: No palpable anomalies  Lumbar Spine Area Exam  Skin & Axial Inspection: No masses, redness, or swelling Alignment: Symmetrical Functional ROM: Unrestricted ROM      Stability: No instability detected Muscle Tone/Strength: Functionally intact. No obvious neuro-muscular anomalies detected. Sensory (Neurological): Unimpaired Palpation: No palpable anomalies       Provocative Tests: Lumbar Hyperextension and rotation test: evaluation deferred today       Lumbar Lateral bending test: evaluation deferred today       Patrick's Maneuver: evaluation deferred today                    Gait & Posture Assessment  Ambulation: Unassisted Gait: Relatively normal for age and body habitus Posture: WNL   Lower Extremity Exam    Side: Right lower extremity  Side: Left lower extremity  Skin & Extremity Inspection: Skin color, temperature, and hair growth are WNL. No peripheral edema or cyanosis. No masses, redness, swelling, asymmetry, or associated skin lesions. No contractures.  Skin & Extremity Inspection: Skin color, temperature, and hair growth are WNL. No peripheral edema or cyanosis. No masses, redness, swelling, asymmetry, or associated skin lesions. No contractures.  Functional ROM: Unrestricted ROM          Functional ROM: Unrestricted ROM          Muscle Tone/Strength: Functionally intact. No obvious neuro-muscular anomalies detected.  Muscle Tone/Strength: Functionally intact. No obvious neuro-muscular anomalies detected.  Sensory (Neurological): Unimpaired  Sensory (Neurological): Unimpaired  Palpation: No palpable anomalies  Palpation: No palpable anomalies   Assessment  Primary Diagnosis & Pertinent Problem List: The primary encounter diagnosis was Chronic low back pain (Primary Area  of Pain) (Bilateral) (R>L). Diagnoses of Lumbar facet syndrome  (Bilateral), Osteoarthritis of lumbar spine, Chronic hip arthralgia (Left), Chronic hip pain (Secondary Area of Pain) (Left), Osteoarthritis of hip (Left), Chronic knee pain (Bilateral) (L>R), and Neurogenic pain were also pertinent to this visit.  Status Diagnosis  Controlled Controlled Controlled 1. Chronic low back pain (Primary Area of Pain) (Bilateral) (R>L)   2. Lumbar facet syndrome (Bilateral)   3. Osteoarthritis of lumbar spine   4. Chronic hip arthralgia (Left)   5. Chronic hip pain (Secondary Area of Pain) (Left)   6. Osteoarthritis of hip (Left)   7. Chronic knee pain (Bilateral) (L>R)   8. Neurogenic pain     Problems updated and reviewed during this visit: No problems updated. Plan of Care  Pharmacotherapy (Medications Ordered): Meds ordered this encounter  Medications  . gabapentin (NEURONTIN) 300 MG capsule    Sig: Take 1-3 capsules (300-900 mg total) by mouth 4 (four) times daily. Follow titration schedule.    Dispense:  360 capsule    Refill:  2    Do not place this medication, or any other prescription from our practice, on "Automatic Refill". Patient may have prescription filled one day early if pharmacy is closed on scheduled refill date.   New Prescriptions   No medications on file   Medications administered today: Mr. Madewell had no medications administered during this visit.   Procedure Orders     KNEE INJECTION Lab Orders  No laboratory test(s) ordered today   Imaging Orders  No imaging studies ordered today    Referral Orders     Ambulatory referral to Orthopedic Surgery  Interventional management options: Planned, scheduled, and/or pending:   Diagnostic bilateral intra-articular knee joint injection with local anesthetic and steroids EMG/PNCV nerve conduction test  May need surgical decompression   Considering:   Diagnostic left intra-articular hip joint injection Possible diagnostic left femoral nerve + obturator nerve block Possible  left femoral nerve + obturator nerve RFA Diagnostic left L2-3 interlaminar lumbar epiduralsteroid injection Diagnostic left L3-4 interlaminar lumbar epiduralsteroid injection Diagnosticleft L4-5 interlaminar lumbar epiduralsteroid injection Diagnosticleft L2-3, L3-4 and L4-5 transforaminal epiduralsteroid injection  Diagnostic right L4-5 and L5-S1 transforaminal epiduralsteroid injection  Diagnostic bilateral lumbar facet block Possible bilateral lumbar facet RFA Diagnostic bilateral intra-articular knee joint injection with local anesthetic and steroids Possible series of 5 bilateral intra-articular Hyalgan knee injections Diagnostic bilateral genicular nerve block Possible bilateral genicular nerve RFA    Palliative PRN treatment(s):   Palliative/Diagnostic bilateral lumbar facet block #2  Palliative left intra-articular hip joint injection Diagnostic left L2-3 interlaminar lumbar epiduralsteroid injection Diagnostic left L3-4 interlaminar lumbar epiduralsteroid injection Diagnosticleft L4-5 interlaminar lumbar epiduralsteroid injection Diagnosticleft L2-3, L3-4 and L4-5 transforaminal epiduralsteroid injection  Diagnostic right L4-5 and L5-S1 transforaminal epiduralsteroid injection    Provider-requested follow-up: Return for Procedure (no sedation): (B) IA KNEE Inj (Steroid).  Future Appointments Date Time Provider [place]  12/22/2016 10:15 AM Milinda Pointer, MD [facility] None  01/04/2017 1:00 PM Milinda Pointer, MD [facility] None  04/06/2017 2:45 PM [facility] None  04/08/2017 1:45 PM [facility] None   Primary Care Physician: Kirk Ruths, MD Location: [facility] Note by: Gaspar Cola, MD Date: 12/09/2016; Time: 3:21 PM

## 2016-12-09 ENCOUNTER — Encounter: Payer: Self-pay | Admitting: Pain Medicine

## 2016-12-09 ENCOUNTER — Ambulatory Visit: Payer: Medicare Other | Attending: Pain Medicine | Admitting: Pain Medicine

## 2016-12-09 VITALS — BP 140/67 | HR 66 | Temp 98.0°F | Resp 16 | Ht 69.0 in | Wt 215.0 lb

## 2016-12-09 DIAGNOSIS — Z7982 Long term (current) use of aspirin: Secondary | ICD-10-CM | POA: Insufficient documentation

## 2016-12-09 DIAGNOSIS — M5441 Lumbago with sciatica, right side: Secondary | ICD-10-CM | POA: Diagnosis not present

## 2016-12-09 DIAGNOSIS — M25562 Pain in left knee: Secondary | ICD-10-CM | POA: Diagnosis not present

## 2016-12-09 DIAGNOSIS — M47816 Spondylosis without myelopathy or radiculopathy, lumbar region: Secondary | ICD-10-CM | POA: Diagnosis not present

## 2016-12-09 DIAGNOSIS — M5442 Lumbago with sciatica, left side: Secondary | ICD-10-CM

## 2016-12-09 DIAGNOSIS — M4726 Other spondylosis with radiculopathy, lumbar region: Secondary | ICD-10-CM | POA: Insufficient documentation

## 2016-12-09 DIAGNOSIS — J45909 Unspecified asthma, uncomplicated: Secondary | ICD-10-CM | POA: Diagnosis not present

## 2016-12-09 DIAGNOSIS — G8929 Other chronic pain: Secondary | ICD-10-CM | POA: Insufficient documentation

## 2016-12-09 DIAGNOSIS — Z4889 Encounter for other specified surgical aftercare: Secondary | ICD-10-CM | POA: Insufficient documentation

## 2016-12-09 DIAGNOSIS — N182 Chronic kidney disease, stage 2 (mild): Secondary | ICD-10-CM | POA: Insufficient documentation

## 2016-12-09 DIAGNOSIS — Z79899 Other long term (current) drug therapy: Secondary | ICD-10-CM | POA: Insufficient documentation

## 2016-12-09 DIAGNOSIS — Z794 Long term (current) use of insulin: Secondary | ICD-10-CM | POA: Diagnosis not present

## 2016-12-09 DIAGNOSIS — Z809 Family history of malignant neoplasm, unspecified: Secondary | ICD-10-CM | POA: Diagnosis not present

## 2016-12-09 DIAGNOSIS — Z811 Family history of alcohol abuse and dependence: Secondary | ICD-10-CM | POA: Insufficient documentation

## 2016-12-09 DIAGNOSIS — E1122 Type 2 diabetes mellitus with diabetic chronic kidney disease: Secondary | ICD-10-CM | POA: Diagnosis not present

## 2016-12-09 DIAGNOSIS — K219 Gastro-esophageal reflux disease without esophagitis: Secondary | ICD-10-CM | POA: Diagnosis not present

## 2016-12-09 DIAGNOSIS — Z9889 Other specified postprocedural states: Secondary | ICD-10-CM | POA: Diagnosis not present

## 2016-12-09 DIAGNOSIS — D649 Anemia, unspecified: Secondary | ICD-10-CM | POA: Insufficient documentation

## 2016-12-09 DIAGNOSIS — M1612 Unilateral primary osteoarthritis, left hip: Secondary | ICD-10-CM | POA: Insufficient documentation

## 2016-12-09 DIAGNOSIS — M25552 Pain in left hip: Secondary | ICD-10-CM | POA: Diagnosis not present

## 2016-12-09 DIAGNOSIS — M25561 Pain in right knee: Secondary | ICD-10-CM | POA: Insufficient documentation

## 2016-12-09 DIAGNOSIS — I129 Hypertensive chronic kidney disease with stage 1 through stage 4 chronic kidney disease, or unspecified chronic kidney disease: Secondary | ICD-10-CM | POA: Diagnosis not present

## 2016-12-09 DIAGNOSIS — M792 Neuralgia and neuritis, unspecified: Secondary | ICD-10-CM

## 2016-12-09 MED ORDER — GABAPENTIN 300 MG PO CAPS: 300.0000 mg | ORAL_CAPSULE | Freq: Four times a day (QID) | ORAL | 2 refills | Status: DC

## 2016-12-09 NOTE — Progress Notes (Signed)
Safety precautions to be maintained throughout the outpatient stay will include: orient to surroundings, keep bed in low position, maintain call bell within reach at all times, provide assistance with transfer out of bed and ambulation.  

## 2016-12-09 NOTE — Patient Instructions (Addendum)
____________________________________________________________________________________________  Preparing for your procedure (without sedation) Instructions: . Oral Intake: Do not eat or drink anything for at least 3 hours prior to your procedure. . Transportation: Unless otherwise stated by your physician, you may drive yourself after the procedure. . Blood Pressure Medicine: Take your blood pressure medicine with a sip of water the morning of the procedure. . Blood thinners:  . Diabetics on insulin: Notify the staff so that you can be scheduled 1st case in the morning. If your diabetes requires high dose insulin, take only  of your normal insulin dose the morning of the procedure and notify the staff that you have done so. . Preventing infections: Shower with an antibacterial soap the morning of your procedure.  . Build-up your immune system: Take 1000 mg of Vitamin C with every meal (3 times a day) the day prior to your procedure. Marland Kitchen Antibiotics: Inform the staff if you have a condition or reason that requires you to take antibiotics before dental procedures. . Pregnancy: If you are pregnant, call and cancel the procedure. . Sickness: If you have a cold, fever, or any active infections, call and cancel the procedure. . Arrival: You must be in the facility at least 30 minutes prior to your scheduled procedure. . Children: Do not bring any children with you. . Dress appropriately: Bring dark clothing that you would not mind if they get stained. . Valuables: Do not bring any jewelry or valuables. Procedure appointments are reserved for interventional treatments only. Marland Kitchen No Prescription Refills. . No medication changes will be discussed during procedure appointments. . No disability issues will be discussed. ____________________________________________________________________________________________  Pain Management Discharge Instructions  General Discharge Instructions :  If you need to  reach your doctor call: Monday-Friday 8:00 am - 4:00 pm at [phone] or toll free [phone].  After clinic hours [phone] to have operator reach doctor.  Bring all of your medication bottles to all your appointments in the pain clinic.  To cancel or reschedule your appointment with Pain Management please remember to call 24 hours in advance to avoid a fee.  Refer to the educational materials which you have been given on: General Risks, I had my Procedure. Discharge Instructions, Post Sedation.  Post Procedure Instructions:  The drugs you were given will stay in your system until tomorrow, so for the next 24 hours you should not drive, make any legal decisions or drink any alcoholic beverages.  You may eat anything you prefer, but it is better to start with liquids then soups and crackers, and gradually work up to solid foods.  Please notify your doctor immediately if you have any unusual bleeding, trouble breathing or pain that is not related to your normal pain.  Depending on the type of procedure that was done, some parts of your body may feel week and/or numb.  This usually clears up by tonight or the next day.  Walk with the use of an assistive device or accompanied by an adult for the 24 hours.  You may use ice on the affected area for the first 24 hours.  Put ice in a Ziploc bag and cover with a towel and place against area 15 minutes on 15 minutes off.  You may switch to heat after 24 hours. Knee Injection A knee injection is a procedure to get medicine into your knee joint. Your health care provider puts a needle into the joint and injects medicine with an attached syringe. The injected medicine may relieve the pain, swelling,  and stiffness of arthritis. The injected medicine may also help to lubricate and cushion your knee joint. You may need more than one injection. Tell a health care provider about:  Any allergies you have.  All medicines you are taking, including  vitamins, herbs, eye drops, creams, and over-the-counter medicines.  Any problems you or family members have had with anesthetic medicines.  Any blood disorders you have.  Any surgeries you have had.  Any medical conditions you have. What are the risks? Generally, this is a safe procedure. However, problems may occur, including:  Infection.  Bleeding.  Worsening symptoms.  Damage to the area around your knee.  Allergic reaction to any of the medicines.  Skin reactions from repeated injections.  What happens before the procedure?  Ask your health care provider about changing or stopping your regular medicines. This is especially important if you are taking diabetes medicines or blood thinners.  Plan to have someone take you home after the procedure. What happens during the procedure?  You will sit or lie down in a position for your knee to be treated.  The skin over your kneecap will be cleaned with a germ-killing solution (antiseptic).  You will be given a medicine that numbs the area (local anesthetic). You may feel some stinging.  After your knee becomes numb, you will have a second injection. This is the medicine. This needle is carefully placed between your kneecap and your knee. The medicine is injected into the joint space.  At the end of the procedure, the needle will be removed.  A bandage (dressing) may be placed over the injection site. The procedure may vary among health care providers and hospitals. What happens after the procedure?  You may have to move your knee through its full range of motion. This helps to get all of the medicine into your joint space.  Your blood pressure, heart rate, breathing rate, and blood oxygen level will be monitored often until the medicines you were given have worn off.  You will be watched to make sure that you do not have a reaction to the injected medicine. This information is not intended to replace advice given to you  by your health care provider. Make sure you discuss any questions you have with your health care provider. Document Released: 05/17/2006 Document Revised: 07/26/2015 Document Reviewed: 01/03/2014 Elsevier Interactive Patient Education  2018 [employer].

## 2016-12-22 ENCOUNTER — Encounter: Payer: Self-pay | Admitting: Pain Medicine

## 2016-12-22 ENCOUNTER — Ambulatory Visit: Payer: Medicare Other | Attending: Pain Medicine | Admitting: Pain Medicine

## 2016-12-22 VITALS — BP 143/78 | HR 70 | Temp 97.8°F | Resp 16 | Ht 69.0 in | Wt 215.0 lb

## 2016-12-22 DIAGNOSIS — R9389 Abnormal findings on diagnostic imaging of other specified body structures: Secondary | ICD-10-CM | POA: Diagnosis not present

## 2016-12-22 DIAGNOSIS — M25562 Pain in left knee: Secondary | ICD-10-CM | POA: Diagnosis not present

## 2016-12-22 DIAGNOSIS — M25561 Pain in right knee: Secondary | ICD-10-CM

## 2016-12-22 DIAGNOSIS — G8929 Other chronic pain: Secondary | ICD-10-CM

## 2016-12-22 DIAGNOSIS — M170 Bilateral primary osteoarthritis of knee: Secondary | ICD-10-CM | POA: Insufficient documentation

## 2016-12-22 MED ORDER — LIDOCAINE HCL 2 % IJ SOLN
20.0000 mL | Freq: Once | INTRAMUSCULAR | Status: AC
  Administered 2016-12-22: 400 mg

## 2016-12-22 MED ORDER — ROPIVACAINE HCL 2 MG/ML IJ SOLN
4.0000 mL | Freq: Once | INTRAMUSCULAR | Status: AC
  Administered 2016-12-22: 10 mL via INTRA_ARTICULAR
  Filled 2016-12-22: qty 10

## 2016-12-22 MED ORDER — METHYLPREDNISOLONE ACETATE 80 MG/ML IJ SUSP
80.0000 mg | Freq: Once | INTRAMUSCULAR | Status: AC
  Administered 2016-12-22: 80 mg via INTRA_ARTICULAR
  Filled 2016-12-22: qty 1

## 2016-12-22 NOTE — Patient Instructions (Signed)

## 2016-12-22 NOTE — Progress Notes (Signed)
Safety precautions to be maintained throughout the outpatient stay will include: orient to surroundings, keep bed in low position, maintain call bell within reach at all times, provide assistance with transfer out of bed and ambulation.  

## 2016-12-22 NOTE — Progress Notes (Signed)
Patient's Name: Joshua Humphrey  MRN: [id number]  Referring Provider: Milinda Pointer, MD  DOB: Jul 14, 1929  PCP: Kirk Ruths, MD  DOS: 12/22/2016  Note by: Gaspar Cola, MD  Service setting: Ambulatory outpatient  Specialty: Interventional Pain Management  Patient type: Established  Location: [facility] (AMB) Pain Management Facility  Visit type: Interventional Procedure   Primary Reason for Visit: Interventional Pain Management Treatment. CC: Knee Pain (bilateral)  Procedure:  Anesthesia, Analgesia, Anxiolysis:  Type: Diagnostic Intra-Articular Local anesthetic and steroid Knee Injection Region: Lateral infrapatellar Knee Region Level: Knee Joint Laterality: Bilateral  Type: Local Anesthesia Local Anesthetic: Lidocaine 1% Route: Infiltration ([place]/IM) IV Access: Declined Sedation: Declined  Indication(s): Analgesia           Indications: 1. Chronic knee pain (Bilateral) (L>R)   2. Osteoarthritis of knee (Bilateral)   3. Abnormal knee x-ray (2017)    Pain Score: Pre-procedure: 0-No pain/10 Post-procedure: 0-No pain/10  Pre-op Assessment:  Joshua Humphrey is a 83 y.o. (year old), male patient, seen today for interventional treatment. He  has a past surgical history that includes tumor removed (2000); Prostate surgery (2002); lipoma removal (2000); Tonsillectomy; Cataract extraction, bilateral; Toe Surgery; Olecranon bursectomy (Left, 01/21/2016); Elbow surgery; and Eye surgery. Joshua Humphrey has a current medication list which includes the following prescription(s): acetaminophen, aspirin ec, atorvastatin, calcium carbonate-vitamin d, carvedilol, cetirizine, ferrous fumarate, flaxseed (linseed), fluticasone, furosemide, gabapentin, glucosamine-chondroitin, hydralazine, ipratropium-albuterol, levemir flextouch, losartan, metformin, mometasone-formoterol, montelukast, ocuvite adult 50+, nifedipine, omeprazole, potassium chloride sa, pramipexole, and sitagliptin. His primarily concern today  is the Knee Pain (bilateral)  Initial Vital Signs: There were no vitals taken for this visit. BMI: Estimated body mass index is 31.75 kg/m as calculated from the following:   Height as of this encounter: 5\' 9"  (1.753 m).   Weight as of this encounter: 215 lb (97.5 kg).  Risk Assessment: Allergies: Reviewed. He has No Known Allergies.  Allergy Precautions: None required Coagulopathies: Reviewed. None identified.  Blood-thinner therapy: None at this time Active Infection(s): Reviewed. None identified. Joshua Humphrey is afebrile  Site Confirmation: Joshua Humphrey was asked to confirm the procedure and laterality before marking the site Procedure checklist: Completed Consent: Before the procedure and under the influence of no sedative(s), amnesic(s), or anxiolytics, the patient was informed of the treatment options, risks and possible complications. To fulfill our ethical and legal obligations, as recommended by the American Medical Association's Code of Ethics, I have informed the patient of my clinical impression; the nature and purpose of the treatment or procedure; the risks, benefits, and possible complications of the intervention; the alternatives, including doing nothing; the risk(s) and benefit(s) of the alternative treatment(s) or procedure(s); and the risk(s) and benefit(s) of doing nothing. The patient was provided information about the general risks and possible complications associated with the procedure. These may include, but are not limited to: failure to achieve desired goals, infection, bleeding, organ or nerve damage, allergic reactions, paralysis, and death. In addition, the patient was informed of those risks and complications associated to the procedure, such as failure to decrease pain; infection; bleeding; organ or nerve damage with subsequent damage to sensory, motor, and/or autonomic systems, resulting in permanent pain, numbness, and/or weakness of one or several areas of the body;  allergic reactions; (i.e.: anaphylactic reaction); and/or death. Furthermore, the patient was informed of those risks and complications associated with the medications. These include, but are not limited to: allergic reactions (i.e.: anaphylactic or anaphylactoid reaction(s)); adrenal axis suppression; blood sugar elevation that in  diabetics may result in ketoacidosis or comma; water retention that in patients with history of congestive heart failure may result in shortness of breath, pulmonary edema, and decompensation with resultant heart failure; weight gain; swelling or edema; medication-induced neural toxicity; particulate matter embolism and blood vessel occlusion with resultant organ, and/or nervous system infarction; and/or aseptic necrosis of one or more joints. Finally, the patient was informed that Medicine is not an exact science; therefore, there is also the possibility of unforeseen or unpredictable risks and/or possible complications that may result in a catastrophic outcome. The patient indicated having understood very clearly. We have given the patient no guarantees and we have made no promises. Enough time was given to the patient to ask questions, all of which were answered to the patient's satisfaction. Joshua Humphrey has indicated that he wanted to continue with the procedure. Attestation: I, the ordering provider, attest that I have discussed with the patient the benefits, risks, side-effects, alternatives, likelihood of achieving goals, and potential problems during recovery for the procedure that I have provided informed consent. Date: 12/22/2016; Time: 7:43 AM  Pre-Procedure Preparation:  Monitoring: As per clinic protocol. Respiration, ETCO2, SpO2, BP, heart rate and rhythm monitor placed and checked for adequate function Safety Precautions: Patient was assessed for positional comfort and pressure points before starting the procedure. Time-out: I initiated and conducted the "Time-out"  before starting the procedure, as per protocol. The patient was asked to participate by confirming the accuracy of the "Time Out" information. Verification of the correct person, site, and procedure were performed and confirmed by me, the nursing staff, and the patient. "Time-out" conducted as per Joint Commission's Universal Protocol (UP.01.01.01). "Time-out" Date & Time: 12/22/2016; 1058 hrs.  Description of Procedure Process:   Position: Sitting Target Area: Knee Joint Approach: Just above the Lateral tibial plateau, lateral to the infrapatellar tendon. Area Prepped: Entire knee area, from the mid-thigh to the mid-shin. Prepping solution: ChloraPrep (2% chlorhexidine gluconate and 70% isopropyl alcohol) Safety Precautions: Aspiration looking for blood return was conducted prior to all injections. At no point did we inject any substances, as a needle was being advanced. No attempts were made at seeking any paresthesias. Safe injection practices and needle disposal techniques used. Medications properly checked for expiration dates. SDV (single dose vial) medications used. Description of the Procedure: Protocol guidelines were followed. The patient was placed in position over the fluoroscopy table. The target area was identified and the area prepped in the usual manner. Skin desensitized using vapocoolant spray. Skin & deeper tissues infiltrated with local anesthetic. Appropriate amount of time allowed to pass for local anesthetics to take effect. The procedure needles were then advanced to the target area. Proper needle placement secured. Negative aspiration confirmed. Solution injected in intermittent fashion, asking for systemic symptoms every 0.5cc of injectate. The needles were then removed and the area cleansed, making sure to leave some of the prepping solution back to take advantage of its long term bactericidal properties. Vitals:   12/22/16 1031 12/22/16 1104  BP: (!) 153/73 (!) 143/78   Pulse: 67 70  Resp: 16 16  Temp: 97.8 F (36.6 C)   TempSrc: Oral   SpO2: 94% 95%  Weight: 215 lb (97.5 kg)   Height: 5\' 9"  (1.753 m)     Start Time: 1059 hrs. End Time: 1101 hrs. Materials:  Needle(s) Type: Regular needle Gauge: 22G Length: 3.5-in Medication(s): We administered methylPREDNISolone acetate, lidocaine, methylPREDNISolone acetate, ropivacaine (PF) 2 mg/mL (0.2%), and ropivacaine (PF) 2 mg/mL (0.2%). Please  see chart orders for dosing details.  Imaging Guidance:  Type of Imaging Technique: None used Indication(s): N/A Exposure Time: No patient exposure Contrast: None used. Fluoroscopic Guidance: N/A Ultrasound Guidance: N/A Interpretation: N/A  Antibiotic Prophylaxis:  Indication(s): None identified Antibiotic given: None  Post-operative Assessment:  EBL: None Complications: No immediate post-treatment complications observed by team, or reported by patient. Note: The patient tolerated the entire procedure well. A repeat set of vitals were taken after the procedure and the patient was kept under observation following institutional policy, for this type of procedure. Post-procedural neurological assessment was performed, showing return to baseline, prior to discharge. The patient was provided with post-procedure discharge instructions, including a section on how to identify potential problems. Should any problems arise concerning this procedure, the patient was given instructions to immediately contact us, at any time, without hesitation. In any case, we plan to contact the patient by telephone for a follow-up status report regarding this interventional procedure. Comments:  No additional relevant information.  Plan of Care   Imaging Orders  No imaging studies ordered today    Procedure Orders     KNEE INJECTION  Medications ordered for procedure: Meds ordered this encounter  Medications  . methylPREDNISolone acetate (DEPO-MEDROL) injection 80 mg  .  lidocaine (XYLOCAINE) 2 % (with pres) injection 400 mg  . methylPREDNISolone acetate (DEPO-MEDROL) injection 80 mg  . ropivacaine (PF) 2 mg/mL (0.2%) (NAROPIN) injection 4 mL  . ropivacaine (PF) 2 mg/mL (0.2%) (NAROPIN) injection 4 mL   Medications administered: We administered methylPREDNISolone acetate, lidocaine, methylPREDNISolone acetate, ropivacaine (PF) 2 mg/mL (0.2%), and ropivacaine (PF) 2 mg/mL (0.2%).  See the medical record for exact dosing, route, and time of administration.  New Prescriptions   No medications on file   Disposition: Discharge home  Discharge Date & Time: 12/22/2016; 1105 hrs.   Physician-requested Follow-up: Return for post-procedure eval by Dr. Dossie Arbour in 2 wks. Future Appointments Date Time Provider [place]  01/04/2017 1:00 PM Milinda Pointer, MD [facility] None  04/06/2017 2:45 PM [facility] None  04/08/2017 1:45 PM [facility] None   Primary Care Physician: Kirk Ruths, MD Location: [facility] Note by: Gaspar Cola, MD Date: 12/22/2016; Time: 11:27 AM  Disclaimer:  Medicine is not an exact science. The only guarantee in medicine is that nothing is guaranteed. It is important to note that the decision to proceed with this intervention was based on the information collected from the patient. The Data and conclusions were drawn from the patient's questionnaire, the interview, and the physical examination. Because the information was provided in large part by the patient, it cannot be guaranteed that it has not been purposely or unconsciously manipulated. Every effort has been made to obtain as much relevant data as possible for this evaluation. It is important to note that the conclusions that lead to this procedure are derived in large part from the available data. Always take into account that the treatment will also be dependent on availability of resources and existing  treatment guidelines, considered by other Pain Management Practitioners as being common knowledge and practice, at the time of the intervention. For Medico-Legal purposes, it is also important to point out that variation in procedural techniques and pharmacological choices are the acceptable norm. The indications, contraindications, technique, and results of the above procedure should only be interpreted and judged by a Board-Certified Interventional Pain Specialist with extensive familiarity and expertise in the same exact procedure and technique.

## 2016-12-23 ENCOUNTER — Telehealth: Payer: Self-pay

## 2016-12-23 ENCOUNTER — Telehealth: Payer: Self-pay | Admitting: *Deleted

## 2016-12-23 NOTE — Telephone Encounter (Signed)
Patient called and states left knee is aching . Denies fever, swelling, redness, drainage. Instructed to give it a few days to subside and give the steroid time to work. Also gave clear instructions to call for any changes or S/S infection should he develop them.

## 2016-12-23 NOTE — Telephone Encounter (Signed)
Post procedure phone call.  Left message.  

## 2017-01-04 ENCOUNTER — Encounter: Payer: Medicare Other | Admitting: Pain Medicine

## 2017-01-04 NOTE — Progress Notes (Deleted)
Patient's Name: Joshua Humphrey  MRN: [id number]  Referring Provider: Kirk Ruths, MD  DOB: 1930/02/24  PCP: Joshua Ruths, MD  DOS: 01/04/2017  Note by: Joshua Cola, MD  Service setting: Ambulatory outpatient  Specialty: Interventional Pain Management  Location: [facility] (AMB) Pain Management Facility    Patient type: Established   Primary Reason(s) for Visit: Encounter for post-procedure evaluation of chronic illness with mild to moderate exacerbation CC: No chief complaint on file.  HPI  Joshua Humphrey is a 83 y.o. year old, male patient, who comes today for a post-procedure evaluation. 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[place]); Chronic pain syndrome; Long term (current) use of opiate analgesic; Long term prescription opiate use; Opiate use (30 MME/Day); Chronic low back pain (Primary Area of Pain) (Bilateral) (R>L); Chronic lower extremity pain (Tertiary source of pain) (Bilateral) (L>R); Abnormal MRI, lumbar spine; Abnormal MRI, cervical spine; Lumbar spinal stenosis with neurogenic claudication; Disturbance of skin sensation; Chronic hip pain (Secondary Area of Pain) (Left); Chronic knee pain (Bilateral) (L>R); Generalized weakness; Muscle weakness (generalized); Chronic Lumbar radicular pain (Left) (L5); Neurogenic pain; Osteoarthritis; Lumbar foraminal stenosis (Right: L4-5 and L5-S1) (Left: L2-3, L3-4, and L4-5); Osteoarthritis of hip (Left); Chronic hip arthralgia (Left); Chronic  arthralgias of knees and hips (Left); Vitamin D insufficiency; Cervical spinal stenosis; Cervical foraminal stenosis; Lumbar facet syndrome (Bilateral); Osteoarthritis of lumbar spine; Osteoarthritis of knee (Bilateral); and Abnormal knee x-ray (2017) on his problem list. His primarily concern today is the No chief complaint on file.  Pain Assessment: Location:     Radiating:   Onset:   Duration:   Quality:   Severity:  /10 (self-reported pain score)  Note: Reported level is compatible with observation.                         When using our objective Pain Scale, levels between 6 and 10/10 are said to belong in an emergency room, as it progressively worsens from a 6/10, described as severely limiting, requiring emergency care not usually available at an outpatient pain management facility. At a 6/10 level, communication becomes difficult and requires great effort. Assistance to reach the emergency department may be required. Facial flushing and profuse sweating along with potentially dangerous increases in heart rate and blood pressure will be evident. Effect on ADL:   Timing:   Modifying factors:    Joshua Humphrey comes in today for post-procedure evaluation after the treatment done on 12/22/2016.  Further details on both, my assessment(s), as well as the proposed treatment plan, please see below.  Post-Procedure Assessment  12/22/2016 Procedure: Diagnostic bilateral intra-articular knee joint injection with local anesthetic and steroids Pre-procedure pain score:  0/10 Post-procedure pain score: 0/10 (100% relief) Influential Factors: BMI:   Intra-procedural challenges: None observed.         Assessment challenges: None detected.              Reported side-effects: None.        Post-procedural adverse reactions or complications: None reported         Sedation: No sedation used. When no sedatives are used, the analgesic  levels obtained are directly associated to the effectiveness of the  local anesthetics. However, when sedation is provided, the level of analgesia obtained during the initial 1 hour following the intervention, is believed to be the result of a combination of factors. These factors may include, but are not limited to: 1. The effectiveness of the local anesthetics used. 2. The effects of the analgesic(s) and/or anxiolytic(s) used. 3. The degree of discomfort experienced by the patient at the time of the procedure. 4. The patients ability and reliability in recalling and recording the events. 5. The presence and influence of possible secondary gains and/or psychosocial factors. Reported result: Relief experienced during the 1st hour after the procedure:   (Ultra-Short Term Relief)            Interpretative annotation: Clinically appropriate result. Analgesia during this period is likely to be Local Anesthetic and/or IV Sedative (Analgesic/Anxiolytic) related.          Effects of local anesthetic: The analgesic effects attained during this period are directly associated to the localized infiltration of local anesthetics and therefore cary significant diagnostic value as to the etiological location, or anatomical origin, of the pain. Expected duration of relief is directly dependent on the pharmacodynamics of the local anesthetic used. Long-acting (4-6 hours) anesthetics used.  Reported result: Relief during the next 4 to 6 hour after the procedure:   (Short-Term Relief)            Interpretative annotation: Clinically appropriate result. Analgesia during this period is likely to be Local Anesthetic-related.          Long-term benefit: Defined as the period of time past the expected duration of local anesthetics (1 hour for short-acting and 4-6 hours for long-acting). With the possible exception of prolonged sympathetic blockade from the local anesthetics, benefits during this period are typically attributed to, or associated with, other factors such as analgesic sensory  neuropraxia, antiinflammatory effects, or beneficial biochemical changes provided by agents other than the local anesthetics.  Reported result: Extended relief following procedure:   (Long-Term Relief)            Interpretative annotation: Clinically appropriate result. Good relief. No permanent benefit expected. Inflammation plays a part in the etiology to the pain.          Current benefits: Defined as reported results that persistent at this point in time.   Analgesia: *** %            Function: Somewhat improved ROM: Somewhat improved Interpretative annotation: Recurrence of symptoms. No permanent benefit expected. Effective diagnostic intervention.          Interpretation: Results would suggest a successful diagnostic intervention.                  Plan:  Please see "Plan of Care" for details.        Laboratory Chemistry  Inflammation Markers (CRP: Acute Phase) (ESR: Chronic Phase) Lab Results  Component Value Date   CRP 0.7 09/08/2016   ESRSEDRATE 16 09/08/2016                 Renal Function Markers Lab Results  Component Value Date   BUN 20 09/08/2016   CREATININE 0.75 (L) 09/08/2016   GFRAA 97 09/08/2016   GFRNONAA 84 09/08/2016                 Hepatic Function Markers Lab Results  Component Value Date   AST 17 09/08/2016   ALT 15 09/08/2016  ALBUMIN 4.2 09/08/2016   ALKPHOS 64 09/08/2016                 Electrolytes Lab Results  Component Value Date   NA 144 09/08/2016   K 3.8 09/08/2016   CL 105 09/08/2016   CALCIUM 9.2 09/08/2016   MG 1.8 09/08/2016                 Neuropathy Markers Lab Results  Component Value Date   VITAMINB12 264 09/08/2016                 Bone Pathology Markers Lab Results  Component Value Date   ALKPHOS 64 09/08/2016   25OHVITD1 41 09/08/2016   25OHVITD2 <1.0 09/08/2016   25OHVITD3 41 09/08/2016   CALCIUM 9.2 09/08/2016                 Coagulation Parameters Lab Results  Component Value Date   PLT 275 01/20/2016                  Cardiovascular Markers Lab Results  Component Value Date   HGB 12.1 (L) 01/20/2016   HCT 36.3 (L) 01/20/2016                 Note: Lab results reviewed.  Recent Diagnostic Imaging Results  DG C-Arm 1-60 Min-No Report Fluoroscopy was utilized by the requesting physician.  No radiographic  interpretation.   Complexity Note: Imaging results reviewed. Results shared with Mr. Meech, using Layman's terms.                         Meds   Current Outpatient Prescriptions:  .  acetaminophen (TYLENOL) 650 MG CR tablet, Take 650-1,300 mg by mouth every 8 (eight) hours as needed (for pain.). , Disp: , Rfl:  .  aspirin EC 81 MG tablet, Take 81 mg by mouth daily. , Disp: , Rfl:  .  atorvastatin (LIPITOR) 40 MG tablet, Take 40 mg by mouth daily. , Disp: , Rfl:  .  Calcium Carbonate-Vitamin D (CALTRATE 600+D PO), Take 2 tablets by mouth daily., Disp: , Rfl:  .  carvedilol (COREG) 25 MG tablet, TAKE 1 TABLET (25 MG) TWICE DAILY, Disp: , Rfl:  .  cetirizine (ZYRTEC) 10 MG tablet, Take 10 mg by mouth every evening. , Disp: , Rfl:  .  ferrous fumarate (HEMOCYTE - 106 MG FE) 325 (106 FE) MG TABS tablet, Take 1 tablet by mouth 2 (two) times daily. , Disp: , Rfl:  .  FLAXSEED, LINSEED, PO, Take by mouth daily., Disp: , Rfl:  .  fluticasone (FLONASE) 50 MCG/ACT nasal spray, Place 1-2 sprays into both nostrils daily as needed (for allergies.). , Disp: , Rfl:  .  furosemide (LASIX) 40 MG tablet, TAKE 1 TABLET BY MOUTH TWICE DAILY, Disp: , Rfl:  .  gabapentin (NEURONTIN) 300 MG capsule, Take 1-3 capsules (300-900 mg total) by mouth 4 (four) times daily. Follow titration schedule., Disp: 360 capsule, Rfl: 2 .  Glucosamine-Chondroit-Vit C-Mn (GLUCOSAMINE-CHONDROITIN) CAPS, Take 2 capsules by mouth daily., Disp: , Rfl:  .  hydrALAZINE (APRESOLINE) 50 MG tablet, Take 50 mg by mouth 3 (three) times daily. , Disp: , Rfl:  .  Ipratropium-Albuterol (COMBIVENT RESPIMAT) 20-100 MCG/ACT AERS respimat,  INHALE 2 PUFF FOUR TIMES DAILY AS NEEDED FOR SHORTNESS OF BREATH AND/OR WHEEZING., Disp: , Rfl:  .  LEVEMIR FLEXTOUCH 100 UNIT/ML Pen, Inject 15 Units into the skin every evening. , Disp: ,  Rfl:  .  losartan (COZAAR) 100 MG tablet, Take 100 mg by mouth daily. , Disp: , Rfl:  .  metFORMIN (GLUCOPHAGE) 850 MG tablet, Take 850 mg by mouth 2 (two) times daily. , Disp: , Rfl:  .  mometasone-formoterol (DULERA) 200-5 MCG/ACT AERO, Inhale 2 puffs into the lungs 2 (two) times daily. , Disp: , Rfl:  .  montelukast (SINGULAIR) 10 MG tablet, Take 10 mg by mouth at bedtime., Disp: , Rfl:  .  Multiple Vitamins-Minerals (OCUVITE ADULT 50+) CAPS, Take 1 capsule by mouth daily. , Disp: , Rfl:  .  NIFEdipine (PROCARDIA XL/ADALAT-CC) 90 MG 24 hr tablet, Take 90 mg by mouth daily. , Disp: , Rfl:  .  omeprazole (PRILOSEC) 20 MG capsule, Take 20 mg by mouth daily., Disp: , Rfl:  .  potassium chloride SA (K-DUR,KLOR-CON) 20 MEQ tablet, TAKE 1 TABLET(20 MEQ) BY MOUTH TWICE DAILY, Disp: , Rfl:  .  pramipexole (MIRAPEX) 0.25 MG tablet, Take 0.25 mg by mouth at bedtime as needed (for restless leg syndrome). , Disp: , Rfl:  .  sitaGLIPtin (JANUVIA) 100 MG tablet, Take 100 mg by mouth daily., Disp: , Rfl:   ROS  Constitutional: Denies any fever or chills Gastrointestinal: No reported hemesis, hematochezia, vomiting, or acute GI distress Musculoskeletal: Denies any acute onset joint swelling, redness, loss of ROM, or weakness Neurological: No reported episodes of acute onset apraxia, aphasia, dysarthria, agnosia, amnesia, paralysis, loss of coordination, or loss of consciousness  Allergies  Mr. Filippi has No Known Allergies.  [employer]  Drug: Mr. Kindt  reports that he does not use drugs. Alcohol:  reports that he drinks alcohol. Tobacco:  reports that he has never smoked. He has never used smokeless tobacco. Medical:  has a past medical history of Anemia; Asthma; Cancer ([place]); Cancer ([place]); Diabetes mellitus without  complication ([place]); GERD (gastroesophageal reflux disease); Hypertension; PONV (postoperative nausea and vomiting); Septic olecranon bursitis of left elbow (01/22/2016); and Sleep apnea. Surgical: Mr. Bair  has a past surgical history that includes tumor removed (2000); Prostate surgery (2002); lipoma removal (2000); Tonsillectomy; Cataract extraction, bilateral; Toe Surgery; Olecranon bursectomy (Left, 01/21/2016); Elbow surgery; and Eye surgery. Family: family history includes Alcohol abuse in his father; Cancer in his brother and mother.  Constitutional Exam  General appearance: Well nourished, well developed, and well hydrated. In no apparent acute distress There were no vitals filed for this visit. BMI Assessment: Estimated body mass index is 31.75 kg/m as calculated from the following:   Height as of 12/22/16: 5' 9"  (1.753 m).   Weight as of 12/22/16: 215 lb (97.5 kg).  BMI interpretation table: BMI level Category Range association with higher incidence of chronic pain  <18 kg/m2 Underweight   18.5-24.9 kg/m2 Ideal body weight   25-29.9 kg/m2 Overweight Increased incidence by 20%  30-34.9 kg/m2 Obese (Class I) Increased incidence by 68%  35-39.9 kg/m2 Severe obesity (Class II) Increased incidence by 136%  >40 kg/m2 Extreme obesity (Class III) Increased incidence by 254%   BMI Readings from Last 4 Encounters:  12/22/16 31.75 kg/m  12/09/16 31.75 kg/m  11/26/16 32.49 kg/m  11/04/16 32.49 kg/m   Wt Readings from Last 4 Encounters:  12/22/16 215 lb (97.5 kg)  12/09/16 215 lb (97.5 kg)  11/26/16 220 lb (99.8 kg)  11/04/16 220 lb (99.8 kg)  Psych/Mental status: Alert, oriented x 3 (person, place, & time)       Eyes: PERLA Respiratory: No evidence of acute respiratory distress  Cervical Spine Area  Exam  Skin & Axial Inspection: No masses, redness, edema, swelling, or associated skin lesions Alignment: Symmetrical Functional ROM: Unrestricted ROM      Stability: No  instability detected Muscle Tone/Strength: Functionally intact. No obvious neuro-muscular anomalies detected. Sensory (Neurological): Unimpaired Palpation: No palpable anomalies              Upper Extremity (UE) Exam    Side: Right upper extremity  Side: Left upper extremity  Skin & Extremity Inspection: Skin color, temperature, and hair growth are WNL. No peripheral edema or cyanosis. No masses, redness, swelling, asymmetry, or associated skin lesions. No contractures.  Skin & Extremity Inspection: Skin color, temperature, and hair growth are WNL. No peripheral edema or cyanosis. No masses, redness, swelling, asymmetry, or associated skin lesions. No contractures.  Functional ROM: Unrestricted ROM          Functional ROM: Unrestricted ROM          Muscle Tone/Strength: Functionally intact. No obvious neuro-muscular anomalies detected.  Muscle Tone/Strength: Functionally intact. No obvious neuro-muscular anomalies detected.  Sensory (Neurological): Unimpaired          Sensory (Neurological): Unimpaired          Palpation: No palpable anomalies              Palpation: No palpable anomalies              Specialized Test(s): Deferred         Specialized Test(s): Deferred          Thoracic Spine Area Exam  Skin & Axial Inspection: No masses, redness, or swelling Alignment: Symmetrical Functional ROM: Unrestricted ROM Stability: No instability detected Muscle Tone/Strength: Functionally intact. No obvious neuro-muscular anomalies detected. Sensory (Neurological): Unimpaired Muscle strength & Tone: No palpable anomalies  Lumbar Spine Area Exam  Skin & Axial Inspection: No masses, redness, or swelling Alignment: Symmetrical Functional ROM: Unrestricted ROM      Stability: No instability detected Muscle Tone/Strength: Functionally intact. No obvious neuro-muscular anomalies detected. Sensory (Neurological): Unimpaired Palpation: No palpable anomalies       Provocative Tests: Lumbar  Hyperextension and rotation test: evaluation deferred today       Lumbar Lateral bending test: evaluation deferred today       Patrick's Maneuver: evaluation deferred today                    Gait & Posture Assessment  Ambulation: Unassisted Gait: Relatively normal for age and body habitus Posture: WNL   Lower Extremity Exam    Side: Right lower extremity  Side: Left lower extremity  Skin & Extremity Inspection: Skin color, temperature, and hair growth are WNL. No peripheral edema or cyanosis. No masses, redness, swelling, asymmetry, or associated skin lesions. No contractures.  Skin & Extremity Inspection: Skin color, temperature, and hair growth are WNL. No peripheral edema or cyanosis. No masses, redness, swelling, asymmetry, or associated skin lesions. No contractures.  Functional ROM: Unrestricted ROM          Functional ROM: Unrestricted ROM          Muscle Tone/Strength: Functionally intact. No obvious neuro-muscular anomalies detected.  Muscle Tone/Strength: Functionally intact. No obvious neuro-muscular anomalies detected.  Sensory (Neurological): Unimpaired  Sensory (Neurological): Unimpaired  Palpation: No palpable anomalies  Palpation: No palpable anomalies   Assessment  Primary Diagnosis & Pertinent Problem List: The primary encounter diagnosis was Chronic knee pain (Bilateral) (L>R). Diagnoses of Chronic low back pain (Primary Area of Pain) (Bilateral) (R>L), Chronic  hip pain (Secondary Area of Pain) (Left), Chronic lower extremity pain (Tertiary source of pain) (Bilateral) (L>R), Lumbar central spinal stenosis (Severe:L4-5; Mild:L3-4; Moderate:L2-3), Lumbar foraminal stenosis (Right: L4-5 and L5-S1) (Left: L2-3, L3-4, and L4-5), and Lumbar spinal stenosis with neurogenic claudication were also pertinent to this visit.  Status Diagnosis  Controlled Controlled Controlled 1. Chronic knee pain (Bilateral) (L>R)   2. Chronic low back pain (Primary Area of Pain) (Bilateral) (R>L)    3. Chronic hip pain (Secondary Area of Pain) (Left)   4. Chronic lower extremity pain (Tertiary source of pain) (Bilateral) (L>R)   5. Lumbar central spinal stenosis (Severe:L4-5; Mild:L3-4; Moderate:L2-3)   6. Lumbar foraminal stenosis (Right: L4-5 and L5-S1) (Left: L2-3, L3-4, and L4-5)   7. Lumbar spinal stenosis with neurogenic claudication     Problems updated and reviewed during this visit: No problems updated. Plan of Care  Pharmacotherapy (Medications Ordered): No orders of the defined types were placed in this encounter.  New Prescriptions   No medications on file   Medications administered today: Mr. Dakin had no medications administered during this visit.  Procedure Orders    No procedure(s) ordered today   Lab Orders  No laboratory test(s) ordered today   Imaging Orders  No imaging studies ordered today   Referral Orders  No referral(s) requested today    Interventional management options: Planned, scheduled, and/or pending:   EMG/PNCV nerve conduction test  May need surgical decompression   Considering:   ***   Palliative PRN treatment(s):   None at this time   Provider-requested follow-up: No Follow-up on file.  Future Appointments Date Time Provider [place]  01/04/2017 1:00 PM Milinda Pointer, MD [facility] None  04/06/2017 2:45 PM [facility] None  04/08/2017 1:45 PM [facility] None   Primary Care Physician: Joshua Ruths, MD Location: [facility] Note by: Joshua Cola, MD Date: 01/04/2017; Time: 8:08 AM

## 2017-01-05 NOTE — Progress Notes (Signed)
Patient's Name: Joshua Humphrey  MRN: [id number]  Referring Provider: Kirk Ruths, MD  DOB: Feb 03, 1930  PCP: Kirk Ruths, MD  DOS: 01/06/2017  Note by: Gaspar Cola, MD  Service setting: Ambulatory outpatient  Specialty: Interventional Pain Management  Location: [facility] (AMB) Pain Management Facility    Patient type: Established   Primary Reason(s) for Visit: Encounter for post-procedure evaluation of chronic illness with mild to moderate exacerbation CC: Knee Pain (right) and Hip Pain (left)  HPI  [name] is a 84 y.o. year old, male patient, who comes today for a post-procedure evaluation. 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[place]); Chronic pain syndrome; Long term (current) use of opiate analgesic; Long term prescription opiate use; Opiate use (30 MME/Day); Chronic low back pain (Primary Area of Pain) (Bilateral) (R>L); Chronic lower extremity pain (Tertiary source of pain) (Bilateral) (L>R); Abnormal MRI, lumbar spine; Abnormal MRI, cervical spine; Lumbar spinal stenosis with neurogenic claudication; Disturbance of skin sensation; Chronic hip pain (Secondary Area of Pain) (Left); Chronic knee pain (Bilateral) (L>R); Generalized weakness; Muscle weakness (generalized); Chronic Lumbar radicular pain (Left) (L5); Neurogenic pain; Osteoarthritis; Lumbar foraminal stenosis (Right: L4-5 and L5-S1) (Left: L2-3, L3-4, and L4-5); Osteoarthritis of hip (Left); Chronic hip arthralgia (Left);  Chronic arthralgias of knees and hips (Left); Vitamin D insufficiency; Cervical spinal stenosis; Cervical foraminal stenosis; Lumbar facet syndrome (Bilateral); Osteoarthritis of lumbar spine; Osteoarthritis of knee (Bilateral); and Abnormal knee x-ray (2017) on his problem list. His primarily concern today is the Knee Pain (right) and Hip Pain (left)  Pain Assessment: Location: Right Knee Radiating: has pain to the inside of the right knee(opposite of where the injection was done) Onset: More than a month ago Duration: Chronic pain Quality: Aching, Constant, Sharp Severity: 4 /10 (self-reported pain score)  Note: Reported level is compatible with observation.                         When using our objective Pain Scale, levels between 6 and 10/10 are said to belong in an emergency room, as it progressively worsens from a 6/10, described as severely limiting, requiring emergency care not usually available at an outpatient pain management facility. At a 6/10 level, communication becomes difficult and requires great effort. Assistance to reach the emergency department may be required. Facial flushing and profuse sweating along with potentially dangerous increases in heart rate and blood pressure will be evident. Effect on ADL: pace self Timing: Constant Modifying factors: rest helps ease the pain  [name] comes in today for post-procedure evaluation after the treatment done on 12/22/2016.  Further details on both, my assessment(s), as well as the proposed treatment plan, please see below.  Post-Procedure Assessment  12/22/2016 Procedure: Diagnostic bilateral intra-articular knee joint injection with local anesthetic and steroids Pre-procedure pain score:  0/10 Post-procedure pain score: 0/10 (100% relief) Influential Factors: BMI: 31.75 kg/m Intra-procedural challenges: None observed.         Assessment challenges: None detected.              Reported side-effects: None.         Post-procedural adverse reactions or  complications: None reported         Sedation: No sedation used. When no sedatives are used, the analgesic levels obtained are directly associated to the effectiveness of the local anesthetics. However, when sedation is provided, the level of analgesia obtained during the initial 1 hour following the intervention, is believed to be the result of a combination of factors. These factors may include, but are not limited to: 1. The effectiveness of the local anesthetics used. 2. The effects of the analgesic(s) and/or anxiolytic(s) used. 3. The degree of discomfort experienced by the patient at the time of the procedure. 4. The patients ability and reliability in recalling and recording the events. 5. The presence and influence of possible secondary gains and/or psychosocial factors. Reported result: Relief experienced during the 1st hour after the procedure: 100 % (left knee 100%: rilght knee 100%) (Ultra-Short Term Relief) [name] has indicated area to have been numb during this time. Interpretative annotation: Clinically appropriate result. Analgesia during this period is likely to be Local Anesthetic and/or IV Sedative (Analgesic/Anxiolytic) related.          Effects of local anesthetic: The analgesic effects attained during this period are directly associated to the localized infiltration of local anesthetics and therefore cary significant diagnostic value as to the etiological location, or anatomical origin, of the pain. Expected duration of relief is directly dependent on the pharmacodynamics of the local anesthetic used. Long-acting (4-6 hours) anesthetics used.  Reported result: Relief during the next 4 to 6 hour after the procedure: 100 % (100% on the right) (Short-Term Relief) [name] has indicated area to have been numb during this time. Interpretative annotation: Clinically appropriate result. Analgesia during this period is likely to be Local  Anesthetic-related.          Long-term benefit: Defined as the period of time past the expected duration of local anesthetics (1 hour for short-acting and 4-6 hours for long-acting). With the possible exception of prolonged sympathetic blockade from the local anesthetics, benefits during this period are typically attributed to, or associated with, other factors such as analgesic sensory neuropraxia, antiinflammatory effects, or beneficial biochemical changes provided by agents other than the local anesthetics.  Reported result: Extended relief following procedure: 100 % (100% for two days then pain started coming back; still have some relief) (Long-Term Relief) Mr. Wellbrock reports the axial pain improved more than the extremity pain. Interpretative annotation: Clinically appropriate result. Good relief. Therapeutic success. Inflammation plays a part in the etiology to the pain. Benefit believed to be steroid-related.  Current benefits: Defined as reported results that persistent at this point in time.   Analgesia: >75 % Mr. Mapel reports improvement of arthralgia. Function: Mr. Majid reports improvement in function ROM: Mr. Loomer reports improvement in ROM Interpretative annotation: Ongoing benefit. Therapeutic success. Effective therapeutic approach. Benefit could be steroid-related.  Interpretation: Results would suggest a successful diagnostic intervention.                  Plan:  Set up procedure as a PRN palliative treatment option for this patient.        Laboratory Chemistry  Inflammation Markers (CRP: Acute Phase) (ESR: Chronic Phase) Lab Results  Component Value Date   CRP 0.7 09/08/2016   ESRSEDRATE 16 09/08/2016                 Renal Function Markers Lab Results  Component Value Date   BUN 20 09/08/2016   CREATININE 0.75 (L) 09/08/2016  GFRAA 97 09/08/2016   GFRNONAA 84 09/08/2016                 Hepatic Function Markers Lab Results  Component Value Date   AST 17  09/08/2016   ALT 15 09/08/2016   ALBUMIN 4.2 09/08/2016   ALKPHOS 64 09/08/2016                 Electrolytes Lab Results  Component Value Date   NA 144 09/08/2016   K 3.8 09/08/2016   CL 105 09/08/2016   CALCIUM 9.2 09/08/2016   MG 1.8 09/08/2016                 Neuropathy Markers Lab Results  Component Value Date   VITAMINB12 264 09/08/2016                 Bone Pathology Markers Lab Results  Component Value Date   ALKPHOS 64 09/08/2016   25OHVITD1 41 09/08/2016   25OHVITD2 <1.0 09/08/2016   25OHVITD3 41 09/08/2016   CALCIUM 9.2 09/08/2016                 Coagulation Parameters Lab Results  Component Value Date   PLT 275 01/20/2016                 Cardiovascular Markers Lab Results  Component Value Date   HGB 12.1 (L) 01/20/2016   HCT 36.3 (L) 01/20/2016                 Note: Lab results reviewed.  Recent Diagnostic Imaging Results  DG C-Arm 1-60 Min-No Report Fluoroscopy was utilized by the requesting physician.  No radiographic  interpretation.   Complexity Note: Imaging results reviewed. Results shared with Mr. Brine, using Layman's terms.                         Meds   Current Outpatient Prescriptions:  .  acetaminophen (TYLENOL) 650 MG CR tablet, Take 650-1,300 mg by mouth every 8 (eight) hours as needed (for pain.). , Disp: , Rfl:  .  aspirin EC 81 MG tablet, Take 81 mg by mouth daily. , Disp: , Rfl:  .  atorvastatin (LIPITOR) 40 MG tablet, Take 40 mg by mouth daily. , Disp: , Rfl:  .  B-D ULTRAFINE III SHORT PEN 31G X 8 MM MISC, USE BID UTD, Disp: , Rfl: 11 .  Calcium Carbonate-Vitamin D (CALTRATE 600+D PO), Take 2 tablets by mouth daily., Disp: , Rfl:  .  carvedilol (COREG) 25 MG tablet, TAKE 1 TABLET (25 MG) TWICE DAILY, Disp: , Rfl:  .  cetirizine (ZYRTEC) 10 MG tablet, Take 10 mg by mouth every evening. , Disp: , Rfl:  .  ferrous fumarate (HEMOCYTE - 106 MG FE) 325 (106 FE) MG TABS tablet, Take 1 tablet by mouth 2 (two) times daily. , Disp:  , Rfl:  .  FLAXSEED, LINSEED, PO, Take by mouth daily., Disp: , Rfl:  .  fluticasone (FLONASE) 50 MCG/ACT nasal spray, Place 1-2 sprays into both nostrils daily as needed (for allergies.). , Disp: , Rfl:  .  furosemide (LASIX) 40 MG tablet, TAKE 1 TABLET BY MOUTH TWICE DAILY, Disp: , Rfl:  .  gabapentin (NEURONTIN) 300 MG capsule, Take 1-3 capsules (300-900 mg total) by mouth 4 (four) times daily. Follow titration schedule., Disp: 360 capsule, Rfl: 2 .  Glucosamine-Chondroit-Vit C-Mn (GLUCOSAMINE-CHONDROITIN) CAPS, Take 2 capsules by mouth daily., Disp: , Rfl:  .  hydrALAZINE (APRESOLINE) 50 MG tablet, Take 50 mg by mouth 3 (three) times daily. , Disp: , Rfl:  .  Ipratropium-Albuterol (COMBIVENT RESPIMAT) 20-100 MCG/ACT AERS respimat, INHALE 2 PUFF FOUR TIMES DAILY AS NEEDED FOR SHORTNESS OF BREATH AND/OR WHEEZING., Disp: , Rfl:  .  LEVEMIR FLEXTOUCH 100 UNIT/ML Pen, Inject 15 Units into the skin every evening. , Disp: , Rfl:  .  losartan (COZAAR) 100 MG tablet, Take 100 mg by mouth daily. , Disp: , Rfl:  .  metFORMIN (GLUCOPHAGE) 850 MG tablet, Take 850 mg by mouth 2 (two) times daily. , Disp: , Rfl:  .  mometasone-formoterol (DULERA) 200-5 MCG/ACT AERO, Inhale 2 puffs into the lungs 2 (two) times daily. , Disp: , Rfl:  .  montelukast (SINGULAIR) 10 MG tablet, Take 10 mg by mouth at bedtime., Disp: , Rfl:  .  Multiple Vitamins-Minerals (OCUVITE ADULT 50+) CAPS, Take 1 capsule by mouth daily. , Disp: , Rfl:  .  NIFEdipine (PROCARDIA XL/ADALAT-CC) 90 MG 24 hr tablet, Take 90 mg by mouth daily. , Disp: , Rfl:  .  omeprazole (PRILOSEC) 20 MG capsule, Take 20 mg by mouth daily., Disp: , Rfl:  .  potassium chloride SA (K-DUR,KLOR-CON) 20 MEQ tablet, TAKE 1 TABLET(20 MEQ) BY MOUTH TWICE DAILY, Disp: , Rfl:  .  pramipexole (MIRAPEX) 0.25 MG tablet, Take 0.25 mg by mouth at bedtime as needed (for restless leg syndrome). , Disp: , Rfl:  .  sitaGLIPtin (JANUVIA) 100 MG tablet, Take 100 mg by mouth daily.,  Disp: , Rfl:   ROS  Constitutional: Denies any fever or chills Gastrointestinal: No reported hemesis, hematochezia, vomiting, or acute GI distress Musculoskeletal: Denies any acute onset joint swelling, redness, loss of ROM, or weakness Neurological: No reported episodes of acute onset apraxia, aphasia, dysarthria, agnosia, amnesia, paralysis, loss of coordination, or loss of consciousness  Allergies  Mr. Pudlo has No Known Allergies.  [place]  Drug: Mr. Salminen  reports that he does not use drugs. Alcohol:  reports that he drinks alcohol. Tobacco:  reports that he has never smoked. He has never used smokeless tobacco. Medical:  has a past medical history of Anemia; Asthma; Cancer ([place]); Cancer ([place]); Diabetes mellitus without complication ([place]); GERD (gastroesophageal reflux disease); Hypertension; PONV (postoperative nausea and vomiting); Septic olecranon bursitis of left elbow (01/22/2016); and Sleep apnea. Surgical: Mr. Manley  has a past surgical history that includes tumor removed (2000); Prostate surgery (2002); lipoma removal (2000); Tonsillectomy; Cataract extraction, bilateral; Toe Surgery; Olecranon bursectomy (Left, 01/21/2016); Elbow surgery; and Eye surgery. Family: family history includes Alcohol abuse in his father; Cancer in his brother and mother.  Constitutional Exam  General appearance: Well nourished, well developed, and well hydrated. In no apparent acute distress Vitals:   01/06/17 1151  BP: (!) 142/74  Pulse: 62  Resp: 16  Temp: 97.9 F (36.6 C)  SpO2: 96%  Weight: 215 lb (97.5 kg)  Height: _0  (1.753 m)   BMI Assessment: Estimated body mass index is 31.75 kg/m as calculated from the following:   Height as of this encounter: _1  (1.753 m).   Weight as of this encounter: 215 lb (97.5 kg).  BMI interpretation table: BMI level Category Range association with higher incidence of chronic pain  <18 kg/m2 Underweight   18.5-24.9 kg/m2 Ideal body weight    25-29.9 kg/m2 Overweight Increased incidence by 20%  30-34.9 kg/m2 Obese (Class I) Increased incidence by 68%  35-39.9 kg/m2 Severe obesity (Class II) Increased incidence by 136%  >  40 kg/m2 Extreme obesity (Class III) Increased incidence by 254%   BMI Readings from Last 4 Encounters:  01/06/17 31.75 kg/m  12/22/16 31.75 kg/m  12/09/16 31.75 kg/m  11/26/16 32.49 kg/m   Wt Readings from Last 4 Encounters:  01/06/17 215 lb (97.5 kg)  12/22/16 215 lb (97.5 kg)  12/09/16 215 lb (97.5 kg)  11/26/16 220 lb (99.8 kg)  Psych/Mental status: Alert, oriented x 3 (person, place, & time)       Eyes: PERLA Respiratory: No evidence of acute respiratory distress  Cervical Spine Area Exam  Skin & Axial Inspection: No masses, redness, edema, swelling, or associated skin lesions Alignment: Symmetrical Functional ROM: Unrestricted ROM      Stability: No instability detected Muscle Tone/Strength: Functionally intact. No obvious neuro-muscular anomalies detected. Sensory (Neurological): Unimpaired Palpation: No palpable anomalies              Upper Extremity (UE) Exam    Side: Right upper extremity  Side: Left upper extremity  Skin & Extremity Inspection: Skin color, temperature, and hair growth are WNL. No peripheral edema or cyanosis. No masses, redness, swelling, asymmetry, or associated skin lesions. No contractures.  Skin & Extremity Inspection: Skin color, temperature, and hair growth are WNL. No peripheral edema or cyanosis. No masses, redness, swelling, asymmetry, or associated skin lesions. No contractures.  Functional ROM: Unrestricted ROM          Functional ROM: Unrestricted ROM          Muscle Tone/Strength: Functionally intact. No obvious neuro-muscular anomalies detected.  Muscle Tone/Strength: Functionally intact. No obvious neuro-muscular anomalies detected.  Sensory (Neurological): Unimpaired          Sensory (Neurological): Unimpaired          Palpation: No palpable anomalies               Palpation: No palpable anomalies              Specialized Test(s): Deferred         Specialized Test(s): Deferred          Thoracic Spine Area Exam  Skin & Axial Inspection: No masses, redness, or swelling Alignment: Symmetrical Functional ROM: Unrestricted ROM Stability: No instability detected Muscle Tone/Strength: Functionally intact. No obvious neuro-muscular anomalies detected. Sensory (Neurological): Unimpaired Muscle strength & Tone: No palpable anomalies  Lumbar Spine Area Exam  Skin & Axial Inspection: No masses, redness, or swelling Alignment: Symmetrical Functional ROM: Decreased ROM      Stability: No instability detected Muscle Tone/Strength: Functionally intact. No obvious neuro-muscular anomalies detected. Sensory (Neurological): Movement-associated discomfort Palpation: No palpable anomalies       Provocative Tests: Lumbar Hyperextension and rotation test: evaluation deferred today       Lumbar Lateral bending test: evaluation deferred today       Patrick's Maneuver: evaluation deferred today                    Gait & Posture Assessment  Ambulation: Patient ambulates using a walker Gait: Very limited, using assistive device to ambulate Posture: Difficulty standing up straight, due to pain   Lower Extremity Exam    Side: Right lower extremity  Side: Left lower extremity  Skin & Extremity Inspection: Skin color, temperature, and hair growth are WNL. No peripheral edema or cyanosis. No masses, redness, swelling, asymmetry, or associated skin lesions. No contractures.  Skin & Extremity Inspection: Skin color, temperature, and hair growth are WNL. No peripheral edema or cyanosis. No masses,  redness, swelling, asymmetry, or associated skin lesions. No contractures.  Functional ROM: Unrestricted ROM          Functional ROM: Unrestricted ROM          Muscle Tone/Strength: Functionally intact. No obvious neuro-muscular anomalies detected.  Muscle Tone/Strength:  Functionally intact. No obvious neuro-muscular anomalies detected.  Sensory (Neurological): Unimpaired  Sensory (Neurological): Unimpaired  Palpation: No palpable anomalies  Palpation: No palpable anomalies   Assessment  Primary Diagnosis & Pertinent Problem List: The primary encounter diagnosis was Chronic low back pain (Primary Area of Pain) (Bilateral) (R>L). Diagnoses of Chronic hip pain (Secondary Area of Pain) (Left), Chronic knee pain (Bilateral) (L>R), Osteoarthritis of knee (Bilateral), Lumbar spinal stenosis with neurogenic claudication, Chronic lower extremity pain (Tertiary source of pain) (Bilateral) (L>R), Chronic Lumbar radicular pain (Left) (L5), Lumbar central spinal stenosis (Severe:L4-5; Mild:L3-4; Moderate:L2-3), and Lumbar foraminal stenosis (Right: L4-5 and L5-S1) (Left: L2-3, L3-4, and L4-5) were also pertinent to this visit.  Status Diagnosis  Improved Improved Improving 1. Chronic low back pain (Primary Area of Pain) (Bilateral) (R>L)   2. Chronic hip pain (Secondary Area of Pain) (Left)   3. Chronic knee pain (Bilateral) (L>R)   4. Osteoarthritis of knee (Bilateral)   5. Lumbar spinal stenosis with neurogenic claudication   6. Chronic lower extremity pain (Tertiary source of pain) (Bilateral) (L>R)   7. Chronic Lumbar radicular pain (Left) (L5)   8. Lumbar central spinal stenosis (Severe:L4-5; Mild:L3-4; Moderate:L2-3)   9. Lumbar foraminal stenosis (Right: L4-5 and L5-S1) (Left: L2-3, L3-4, and L4-5)     Problems updated and reviewed during this visit: No problems updated. Plan of Care  Pharmacotherapy (Medications Ordered): No orders of the defined types were placed in this encounter.  New Prescriptions   No medications on file   Medications administered today: Mr. Walrath had no medications administered during this visit.   Procedure Orders     KNEE INJECTION     KNEE INJECTION Lab Orders  No laboratory test(s) ordered today   Imaging Orders  No  imaging studies ordered today   Referral Orders  No referral(s) requested today    Interventional management options: Planned, scheduled, and/or pending:   Therapeutic  series of 5 bilateral intra-articular Hyalgan knee injections EMG/PNCV nerve conduction test  May need surgical decompression   Considering:   Diagnostic left intra-articular hip joint injection Possible diagnostic left femoral nerve + obturator nerve block Possible left femoral nerve + obturator nerve RFA Diagnostic left L2-3 interlaminar lumbar epiduralsteroid injection Diagnostic left L3-4 interlaminar lumbar epiduralsteroid injection Diagnosticleft L4-5 interlaminar lumbar epiduralsteroid injection Diagnosticleft L2-3, L3-4 and L4-5 transforaminal epiduralsteroid injection  Diagnostic right L4-5 and L5-S1 transforaminal epiduralsteroid injection  Diagnostic bilateral lumbar facet block Possible bilateral lumbar facet RFA Diagnostic bilateral intra-articular knee joint injection with local anesthetic and steroids Possible series of 5 bilateral intra-articular Hyalgan knee injections Diagnostic bilateral genicular nerve block Possible bilateral genicular nerve RFA    Palliative PRN treatment(s):   Palliative/Diagnostic bilateral lumbar facet block #2  Palliativeleft intra-articular hip joint injection Diagnostic left L2-3 interlaminar lumbar epiduralsteroid injection Diagnostic left L3-4 interlaminar lumbar epiduralsteroid injection Diagnosticleft L4-5 interlaminar lumbar epiduralsteroid injection Diagnosticleft L2-3, L3-4 and L4-5 transforaminal epiduralsteroid injection  Diagnostic right L4-5 and L5-S1 transforaminal epiduralsteroid injection    Provider-requested follow-up: Return for Procedure (no sedation): (B) IA Hyalgan #1.  Future Appointments Date Time Provider [place]  01/21/2017 12:30 PM Milinda Pointer, MD [facility] None  04/06/2017 2:45 PM [facility]  None  04/08/2017  1:45 PM [facility] None   Primary Care Physician: Kirk Ruths, MD Location: [facility] Note by: Gaspar Cola, MD Date: 01/06/2017; Time: 5:02 PM

## 2017-01-06 ENCOUNTER — Encounter: Payer: Self-pay | Admitting: Pain Medicine

## 2017-01-06 ENCOUNTER — Ambulatory Visit: Payer: Medicare Other | Attending: Pain Medicine | Admitting: Pain Medicine

## 2017-01-06 VITALS — BP 142/74 | HR 62 | Temp 97.9°F | Resp 16 | Ht 69.0 in | Wt 215.0 lb

## 2017-01-06 DIAGNOSIS — Z794 Long term (current) use of insulin: Secondary | ICD-10-CM | POA: Diagnosis not present

## 2017-01-06 DIAGNOSIS — M48062 Spinal stenosis, lumbar region with neurogenic claudication: Secondary | ICD-10-CM | POA: Diagnosis not present

## 2017-01-06 DIAGNOSIS — M545 Low back pain: Secondary | ICD-10-CM | POA: Insufficient documentation

## 2017-01-06 DIAGNOSIS — N182 Chronic kidney disease, stage 2 (mild): Secondary | ICD-10-CM | POA: Diagnosis not present

## 2017-01-06 DIAGNOSIS — I129 Hypertensive chronic kidney disease with stage 1 through stage 4 chronic kidney disease, or unspecified chronic kidney disease: Secondary | ICD-10-CM | POA: Insufficient documentation

## 2017-01-06 DIAGNOSIS — M79604 Pain in right leg: Secondary | ICD-10-CM | POA: Insufficient documentation

## 2017-01-06 DIAGNOSIS — Z79891 Long term (current) use of opiate analgesic: Secondary | ICD-10-CM | POA: Diagnosis not present

## 2017-01-06 DIAGNOSIS — M79605 Pain in left leg: Secondary | ICD-10-CM | POA: Insufficient documentation

## 2017-01-06 DIAGNOSIS — K219 Gastro-esophageal reflux disease without esophagitis: Secondary | ICD-10-CM | POA: Insufficient documentation

## 2017-01-06 DIAGNOSIS — M5441 Lumbago with sciatica, right side: Secondary | ICD-10-CM | POA: Diagnosis not present

## 2017-01-06 DIAGNOSIS — E1122 Type 2 diabetes mellitus with diabetic chronic kidney disease: Secondary | ICD-10-CM | POA: Insufficient documentation

## 2017-01-06 DIAGNOSIS — E785 Hyperlipidemia, unspecified: Secondary | ICD-10-CM | POA: Diagnosis not present

## 2017-01-06 DIAGNOSIS — M5442 Lumbago with sciatica, left side: Secondary | ICD-10-CM

## 2017-01-06 DIAGNOSIS — M25552 Pain in left hip: Secondary | ICD-10-CM | POA: Diagnosis not present

## 2017-01-06 DIAGNOSIS — M9983 Other biomechanical lesions of lumbar region: Secondary | ICD-10-CM | POA: Diagnosis not present

## 2017-01-06 DIAGNOSIS — M47896 Other spondylosis, lumbar region: Secondary | ICD-10-CM | POA: Insufficient documentation

## 2017-01-06 DIAGNOSIS — G2581 Restless legs syndrome: Secondary | ICD-10-CM | POA: Diagnosis not present

## 2017-01-06 DIAGNOSIS — M5410 Radiculopathy, site unspecified: Secondary | ICD-10-CM | POA: Diagnosis not present

## 2017-01-06 DIAGNOSIS — G894 Chronic pain syndrome: Secondary | ICD-10-CM | POA: Diagnosis not present

## 2017-01-06 DIAGNOSIS — E559 Vitamin D deficiency, unspecified: Secondary | ICD-10-CM | POA: Insufficient documentation

## 2017-01-06 DIAGNOSIS — M419 Scoliosis, unspecified: Secondary | ICD-10-CM | POA: Insufficient documentation

## 2017-01-06 DIAGNOSIS — M5416 Radiculopathy, lumbar region: Secondary | ICD-10-CM | POA: Insufficient documentation

## 2017-01-06 DIAGNOSIS — G4733 Obstructive sleep apnea (adult) (pediatric): Secondary | ICD-10-CM | POA: Insufficient documentation

## 2017-01-06 DIAGNOSIS — M48061 Spinal stenosis, lumbar region without neurogenic claudication: Secondary | ICD-10-CM

## 2017-01-06 DIAGNOSIS — Z7982 Long term (current) use of aspirin: Secondary | ICD-10-CM | POA: Diagnosis not present

## 2017-01-06 DIAGNOSIS — M25561 Pain in right knee: Secondary | ICD-10-CM | POA: Diagnosis not present

## 2017-01-06 DIAGNOSIS — M4807 Spinal stenosis, lumbosacral region: Secondary | ICD-10-CM | POA: Diagnosis not present

## 2017-01-06 DIAGNOSIS — G8929 Other chronic pain: Secondary | ICD-10-CM

## 2017-01-06 DIAGNOSIS — J45909 Unspecified asthma, uncomplicated: Secondary | ICD-10-CM | POA: Diagnosis not present

## 2017-01-06 DIAGNOSIS — M170 Bilateral primary osteoarthritis of knee: Secondary | ICD-10-CM

## 2017-01-06 DIAGNOSIS — D649 Anemia, unspecified: Secondary | ICD-10-CM | POA: Diagnosis not present

## 2017-01-06 DIAGNOSIS — Z79899 Other long term (current) drug therapy: Secondary | ICD-10-CM | POA: Diagnosis not present

## 2017-01-06 DIAGNOSIS — M25562 Pain in left knee: Secondary | ICD-10-CM

## 2017-01-06 DIAGNOSIS — M4802 Spinal stenosis, cervical region: Secondary | ICD-10-CM | POA: Insufficient documentation

## 2017-01-06 NOTE — Progress Notes (Signed)
Safety precautions to be maintained throughout the outpatient stay will include: orient to surroundings, keep bed in low position, maintain call bell within reach at all times, provide assistance with transfer out of bed and ambulation.  

## 2017-01-06 NOTE — Patient Instructions (Addendum)
____________________________________________________________________________________________  Preparing for your procedure (without sedation) Instructions: . Oral Intake: Do not eat or drink anything for at least 3 hours prior to your procedure. . Transportation: Unless otherwise stated by your physician, you may drive yourself after the procedure. . Blood Pressure Medicine: Take your blood pressure medicine with a sip of water the morning of the procedure. . Blood thinners:  . Diabetics on insulin: Notify the staff so that you can be scheduled 1st case in the morning. If your diabetes requires high dose insulin, take only  of your normal insulin dose the morning of the procedure and notify the staff that you have done so. . Preventing infections: Shower with an antibacterial soap the morning of your procedure.  . Build-up your immune system: Take 1000 mg of Vitamin C with every meal (3 times a day) the day prior to your procedure. Marland Kitchen Antibiotics: Inform the staff if you have a condition or reason that requires you to take antibiotics before dental procedures. . Pregnancy: If you are pregnant, call and cancel the procedure. . Sickness: If you have a cold, fever, or any active infections, call and cancel the procedure. . Arrival: You must be in the facility at least 30 minutes prior to your scheduled procedure. . Children: Do not bring any children with you. . Dress appropriately: Bring dark clothing that you would not mind if they get stained. . Valuables: Do not bring any jewelry or valuables. Procedure appointments are reserved for interventional treatments only. Marland Kitchen No Prescription Refills. . No medication changes will be discussed during procedure appointments. . No disability issues will be discussed. ____________________________________________________________________________________________   Knee Injection A knee injection is a procedure to get medicine into your knee joint. Your  health care provider puts a needle into the joint and injects medicine with an attached syringe. The injected medicine may relieve the pain, swelling, and stiffness of arthritis. The injected medicine may also help to lubricate and cushion your knee joint. You may need more than one injection. Tell a health care provider about: Any allergies you have. All medicines you are taking, including vitamins, herbs, eye drops, creams, and over-the-counter medicines. Any problems you or family members have had with anesthetic medicines. Any blood disorders you have. Any surgeries you have had. Any medical conditions you have. What are the risks? Generally, this is a safe procedure. However, problems may occur, including: Infection. Bleeding. Worsening symptoms. Damage to the area around your knee. Allergic reaction to any of the medicines. Skin reactions from repeated injections.  What happens before the procedure? Ask your health care provider about changing or stopping your regular medicines. This is especially important if you are taking diabetes medicines or blood thinners. Plan to have someone take you home after the procedure. What happens during the procedure? You will sit or lie down in a position for your knee to be treated. The skin over your kneecap will be cleaned with a germ-killing solution (antiseptic). You will be given a medicine that numbs the area (local anesthetic). You may feel some stinging. After your knee becomes numb, you will have a second injection. This is the medicine. This needle is carefully placed between your kneecap and your knee. The medicine is injected into the joint space. At the end of the procedure, the needle will be removed. A bandage (dressing) may be placed over the injection site. The procedure may vary among health care providers and hospitals. What happens after the procedure? You may have to move  your knee through its full range of motion. This helps  to get all of the medicine into your joint space. Your blood pressure, heart rate, breathing rate, and blood oxygen level will be monitored often until the medicines you were given have worn off. You will be watched to make sure that you do not have a reaction to the injected medicine. This information is not intended to replace advice given to you by your health care provider. Make sure you discuss any questions you have with your health care provider. Document Released: 05/17/2006 Document Revised: 07/26/2015 Document Reviewed: 01/03/2014 Elsevier Interactive Patient Education  2018 [employer].

## 2017-01-21 ENCOUNTER — Ambulatory Visit: Payer: Medicare Other | Attending: Pain Medicine | Admitting: Pain Medicine

## 2017-01-21 ENCOUNTER — Encounter: Payer: Self-pay | Admitting: Pain Medicine

## 2017-01-21 ENCOUNTER — Ambulatory Visit: Payer: Medicare Other | Admitting: Pain Medicine

## 2017-01-21 ENCOUNTER — Other Ambulatory Visit: Payer: Self-pay

## 2017-01-21 VITALS — BP 173/89 | HR 65 | Temp 98.0°F | Resp 18 | Ht 69.0 in | Wt 215.0 lb

## 2017-01-21 DIAGNOSIS — M170 Bilateral primary osteoarthritis of knee: Secondary | ICD-10-CM | POA: Diagnosis not present

## 2017-01-21 DIAGNOSIS — M25561 Pain in right knee: Secondary | ICD-10-CM

## 2017-01-21 DIAGNOSIS — M150 Primary generalized (osteo)arthritis: Secondary | ICD-10-CM

## 2017-01-21 DIAGNOSIS — M25562 Pain in left knee: Secondary | ICD-10-CM | POA: Diagnosis not present

## 2017-01-21 DIAGNOSIS — G8929 Other chronic pain: Secondary | ICD-10-CM

## 2017-01-21 DIAGNOSIS — M159 Polyosteoarthritis, unspecified: Secondary | ICD-10-CM

## 2017-01-21 MED ORDER — LIDOCAINE HCL (PF) 1 % IJ SOLN
5.0000 mL | Freq: Once | INTRAMUSCULAR | Status: AC
  Administered 2017-01-21: 5 mL
  Filled 2017-01-21: qty 5

## 2017-01-21 MED ORDER — ROPIVACAINE HCL 2 MG/ML IJ SOLN
2.0000 mL | Freq: Once | INTRAMUSCULAR | Status: AC
  Administered 2017-01-21: 2 mL via INTRA_ARTICULAR
  Filled 2017-01-21: qty 10

## 2017-01-21 MED ORDER — SODIUM HYALURONATE (VISCOSUP) 20 MG/2ML IX SOSY
2.0000 mL | PREFILLED_SYRINGE | Freq: Once | INTRA_ARTICULAR | Status: AC
  Administered 2017-01-21: 2 mL via INTRA_ARTICULAR
  Filled 2017-01-21: qty 2

## 2017-01-21 NOTE — Patient Instructions (Addendum)
____________________________________________________________________________________________  Post-Procedure instructions Instructions:  Apply ice: Fill a plastic sandwich bag with crushed ice. Cover it with a small towel and apply to injection site. Apply for 15 minutes then remove x 15 minutes. Repeat sequence on day of procedure, until you go to bed. The purpose is to minimize swelling and discomfort after procedure.  Apply heat: Apply heat to procedure site starting the day following the procedure. The purpose is to treat any soreness and discomfort from the procedure.  Food intake: Start with clear liquids (like water) and advance to regular food, as tolerated.   Physical activities: Keep activities to a minimum for the first 8 hours after the procedure.   Driving: If you have received any sedation, you are not allowed to drive for 24 hours after your procedure.  Blood thinner: Restart your blood thinner 6 hours after your procedure. (Only for those taking blood thinners)  Insulin: As soon as you can eat, you may resume your normal dosing schedule. (Only for those taking insulin)  Infection prevention: Keep procedure site clean and dry.  Post-procedure Pain Diary: Extremely important that this be done correctly and accurately. Recorded information will be used to determine the next step in treatment.  Pain evaluated is that of treated area only. Do not include pain from an untreated area.  Complete every hour, on the hour, for the initial 8 hours. Set an alarm to help you do this part accurately.  Do not go to sleep and have it completed later. It will not be accurate.  Follow-up appointment: Keep your follow-up appointment after the procedure. Usually 2 weeks for most procedures. (6 weeks in the case of radiofrequency.) Bring you pain diary.  Expect:  From numbing medicine (AKA: Local Anesthetics): Numbness or decrease in pain.  Onset: Full effect within 15 minutes of  injected.  Duration: It will depend on the type of local anesthetic used. On the average, 1 to 8 hours.   From steroids: Decrease in swelling or inflammation. Once inflammation is improved, relief of the pain will follow.  Onset of benefits: Depends on the amount of swelling present. The more swelling, the longer it will take for the benefits to be seen. In some cases, up to 10 days.  Duration: Steroids will stay in the system x 2 weeks. Duration of benefits will depend on multiple posibilities including persistent irritating factors.  From procedure: Some discomfort is to be expected once the numbing medicine wears off. This should be minimal if ice and heat are applied as instructed. Call if:  You experience numbness and weakness that gets worse with time, as opposed to wearing off.  New onset bowel or bladder incontinence. (Spinal procedures only)  Emergency Numbers:  Durning business hours (Monday - Thursday, 8:00 AM - 4:00 PM) (Friday, 9:00 AM - 12:00 Noon): [phone]  After hours: [phone] ____________________________________________________________________________________________   Post-procedure Information What to expect: Most procedures involve the use of a local anesthetic (numbing medicine), and a steroid (anti-inflammatory medicine).  The local anesthetics may cause temporary numbness and weakness of the legs or arms, depending on the location of the block. This numbness/weakness may last 4-6 hours, depending on the local anesthetic used. In rare instances, it can last up to 24 hours. While numb, you must be very careful not to injure the extremity.  After any procedure, you could expect the pain to get better within 15-20 minutes. This relief is temporary and may last 4-6 hours. Once the local anesthetics wears   off, you could experience discomfort, possibly more than usual, for up to 10 (ten) days. In the case of radiofrequencies, it may last up to 6 weeks.  Surgeries may take up to 8 weeks for the healing process. The discomfort is due to the irritation caused by needles going through skin and muscle. To minimize the discomfort, we recommend using ice the first day, and heat from then on. The ice should be applied for 15 minutes on, and 15 minutes off. Keep repeating this cycle until bedtime. Avoid applying the ice directly to the skin, to prevent frostbite. Heat should be used daily, until the pain improves (4-10 days). Be careful not to burn yourself.  Occasionally you may experience muscle spasms or cramps. These occur as a consequence of the irritation caused by the needle sticks to the muscle and the blood that will inevitably be lost into the surrounding muscle tissue. Blood tends to be very irritating to tissues, which tend to react by going into spasm. These spasms may start the same day of your procedure, but they may also take days to develop. This late onset type of spasm or cramp is usually caused by electrolyte imbalances triggered by the steroids, at the level of the kidney. Cramps and spasms tend to respond well to muscle relaxants, multivitamins (some are triggered by the procedure, but may have their origins in vitamin deficiencies), and "Gatorade", or any sports drinks that can replenish any electrolyte imbalances. (If you are a diabetic, ask your pharmacist to get you a sugar-free brand.) Warm showers or baths may also be helpful. Stretching exercises are highly recommended. General Instructions:  Be alert for signs of possible infection: redness, swelling, heat, red streaks, elevated temperature, and/or fever. These typically appear 4 to 6 days after the procedure. Immediately notify your doctor if you experience unusual bleeding, difficulty breathing, or loss of bowel or bladder control. If you experience increased pain, do not increase your pain medicine intake, unless instructed by your pain physician. Post-Procedure Care:  Be careful in  moving about. Muscle spasms in the area of the injection may occur. Applying ice or heat to the area is often helpful. The incidence of spinal headaches after epidural injections ranges between 1.4% and 6%. If you develop a headache that does not seem to respond to conservative therapy, please let your physician know. This can be treated with an epidural blood patch.   Post-procedure numbness or redness is to be expected, however it should average 4 to 6 hours. If numbness and weakness of your extremities begins to develop 4 to 6 hours after your procedure, and is felt to be progressing and worsening, immediately contact your physician.   Diet:  If you experience nausea, do not eat until this sensation goes away. If you had a "Stellate Ganglion Block" for upper extremity "Reflex Sympathetic Dystrophy", do not eat or drink until your hoarseness goes away. In any case, always start with liquids first and if you tolerate them well, then slowly progress to more solid foods. Activity:  For the first 4 to 6 hours after the procedure, use caution in moving about as you may experience numbness and/or weakness. Use caution in cooking, using household electrical appliances, and climbing steps. If you need to reach your Doctor call our office: [phone] Monday-Thursday 8:00 am - 4:00 PM    Fridays: Closed     In case of an emergency: In case of emergency, call 911 or go to the nearest emergency room and   have the physician there call us.  Interpretation of Procedure Every nerve block has two components: a diagnostic component, and a treatment component. Unrealistic expectations are the most common causes of "perceived failure".  In a perfect world, a single nerve block should be able to completely and permanently eliminate the pain. Sadly, the world is not perfect.  Most pain management nerve blocks are performed using local anesthetics and steroids. Steroids are responsible for any long-term benefit that  you may experience. Their purpose is to decrease any chronic swelling that may exist in the area. Steroids begin to work immediately after being injected. However, most patients will not experience any benefits until 5 to 10 days after the injection, when the swelling has come down to the point where they can tell a difference. Steroids will only help if there is swelling to be treated. As such, they can assist with the diagnosis. If effective, they suggest an inflammatory component to the pain, and if ineffective, they rule out inflammation as the main cause or component of the problem. If the problem is one of mechanical compression, you will get no benefit from those steroids.   In the case of local anesthetics, they have a crucial role in the diagnosis of your condition. Most will begin to work within15 to 20 minutes after injection. The duration will depend on the type used (short- vs. Long-acting). It is of outmost importance that patients keep tract of their pain, after the procedure. To assist with this matter, a "Post-procedure Pain Diary" is provided. Make sure to complete it and to bring it back to your follow-up appointment.  As long as the patient keeps accurate, detailed records of their symptoms after every procedure, and returns to have those interpreted, every procedure will provide us with invaluable information. Even a block that does not provide the patient with any relief, will always provide us with information about the mechanism and the origin of the pain. The only time a nerve block can be considered a waste of time is when patients do not keep track of the results, or do not keep their post-procedure appointment.  Reporting the results back to your physician The Pain Score  Pain is a subjective complaint. It cannot be seen, touched, or measured. We depend entirely on the patient's report of the pain in order to assess your condition and treatment. To evaluate the pain, we use a pain  scale, where "0" means "No Pain", and a "10" is "the worst possible pain that you can even imagine" (i.e. something like been eaten alive by a shark or being torn apart by a lion).   You will frequently be asked to rate your pain. Please be as accurate, remember that medical decisions will be based on your responses. Please do not rate your pain above a 10. Doing so is actually interpreted as "symptom magnification" (exaggeration), as well as lack of understanding with regards to the scale. To put this into perspective, when you tell us that your pain is at a 10 (ten), what you are saying is that there is nothing we can do to make this pain any worse. (Carefully think about that.) 

## 2017-01-21 NOTE — Progress Notes (Signed)
Patient's Name: Joshua Humphrey  MRN: [id number]  Referring Provider: Kirk Ruths, MD  DOB: [birth year]  PCP: Kirk Ruths, MD  DOS: 01/21/2017  Note by: Gaspar Cola, MD  Service setting: Ambulatory outpatient  Specialty: Interventional Pain Management  Patient type: Established  Location: [facility] (AMB) Pain Management Facility  Visit type: Interventional Procedure   Primary Reason for Visit: Interventional Pain Management Treatment. CC: Knee Pain (bilateral)  Procedure:  Anesthesia, Analgesia, Anxiolysis:  Type: Diagnostic Intra-Articular Hyalgan Knee Injection #1 Region: Lateral infrapatellar Knee Region Level: Knee Joint Laterality: Bilateral  Type: Local Anesthesia Local Anesthetic: Lidocaine 1% Route: Infiltration ([place]/IM) IV Access: Declined Sedation: Declined  Indication(s): Analgesia           Indications: 1. Chronic knee pain (Bilateral) (L>R)   2. Osteoarthritis of knee (Bilateral)   3. Osteoarthritis    Pain Score: Pre-procedure: 4 /10 Post-procedure: 0-No pain/10  Pre-op Assessment:  Joshua Humphrey is a 83 y.o. (year old), male patient, seen today for interventional treatment. He  has a past surgical history that includes tumor removed (2000); Prostate surgery (2002); lipoma removal (2000); Tonsillectomy; Cataract extraction, bilateral; Toe Surgery; Olecranon bursectomy (Left, 01/21/2016); Elbow surgery; and Eye surgery. Joshua Humphrey has a current medication list which includes the following prescription(s): acetaminophen, aspirin ec, atorvastatin, b-d ultrafine iii short pen, calcium carbonate-vitamin d, carvedilol, cetirizine, ferrous fumarate, flaxseed (linseed), fluticasone, furosemide, gabapentin, glucosamine-chondroitin, hydralazine, ipratropium-albuterol, levemir flextouch, losartan, metformin, mometasone-formoterol, montelukast, ocuvite adult 50+, nifedipine, omeprazole, potassium chloride sa, pramipexole, and sitagliptin. His primarily concern today is the  Knee Pain (bilateral)  Initial Vital Signs: There were no vitals taken for this visit. BMI: Estimated body mass index is 31.75 kg/m as calculated from the following:   Height as of this encounter: 5\' 9"  (1.753 m).   Weight as of this encounter: 215 lb (97.5 kg).  Risk Assessment: Allergies: Reviewed. He has No Known Allergies.  Allergy Precautions: None required Coagulopathies: Reviewed. None identified.  Blood-thinner therapy: None at this time Active Infection(s): Reviewed. None identified. Joshua Humphrey is afebrile  Site Confirmation: Joshua Humphrey was asked to confirm the procedure and laterality before marking the site Procedure checklist: Completed Consent: Before the procedure and under the influence of no sedative(s), amnesic(s), or anxiolytics, the patient was informed of the treatment options, risks and possible complications. To fulfill our ethical and legal obligations, as recommended by the American Medical Association's Code of Ethics, I have informed the patient of my clinical impression; the nature and purpose of the treatment or procedure; the risks, benefits, and possible complications of the intervention; the alternatives, including doing nothing; the risk(s) and benefit(s) of the alternative treatment(s) or procedure(s); and the risk(s) and benefit(s) of doing nothing. The patient was provided information about the general risks and possible complications associated with the procedure. These may include, but are not limited to: failure to achieve desired goals, infection, bleeding, organ or nerve damage, allergic reactions, paralysis, and death. In addition, the patient was informed of those risks and complications associated to the procedure, such as failure to decrease pain; infection; bleeding; organ or nerve damage with subsequent damage to sensory, motor, and/or autonomic systems, resulting in permanent pain, numbness, and/or weakness of one or several areas of the body; allergic  reactions; (i.e.: anaphylactic reaction); and/or death. Furthermore, the patient was informed of those risks and complications associated with the medications. These include, but are not limited to: allergic reactions (i.e.: anaphylactic or anaphylactoid reaction(s)); adrenal axis suppression; blood sugar elevation that  in diabetics may result in ketoacidosis or comma; water retention that in patients with history of congestive heart failure may result in shortness of breath, pulmonary edema, and decompensation with resultant heart failure; weight gain; swelling or edema; medication-induced neural toxicity; particulate matter embolism and blood vessel occlusion with resultant organ, and/or nervous system infarction; and/or aseptic necrosis of one or more joints. Finally, the patient was informed that Medicine is not an exact science; therefore, there is also the possibility of unforeseen or unpredictable risks and/or possible complications that may result in a catastrophic outcome. The patient indicated having understood very clearly. We have given the patient no guarantees and we have made no promises. Enough time was given to the patient to ask questions, all of which were answered to the patient's satisfaction. Joshua Humphrey has indicated that he wanted to continue with the procedure. Attestation: I, the ordering provider, attest that I have discussed with the patient the benefits, risks, side-effects, alternatives, likelihood of achieving goals, and potential problems during recovery for the procedure that I have provided informed consent. Date: 01/21/2017; Time: 7:16 AM  Pre-Procedure Preparation:  Monitoring: As per clinic protocol. Respiration, ETCO2, SpO2, BP, heart rate and rhythm monitor placed and checked for adequate function Safety Precautions: Patient was assessed for positional comfort and pressure points before starting the procedure. Time-out: I initiated and conducted the "Time-out" before  starting the procedure, as per protocol. The patient was asked to participate by confirming the accuracy of the "Time Out" information. Verification of the correct person, site, and procedure were performed and confirmed by me, the nursing staff, and the patient. "Time-out" conducted as per Joint Commission's Universal Protocol (UP.01.01.01). "Time-out" Date & Time: 01/21/2017; 0929 hrs.  Description of Procedure Process:   Position: Sitting Target Area: Knee Joint Approach: Just above the Lateral tibial plateau, lateral to the infrapatellar tendon. Area Prepped: Entire knee area, from the mid-thigh to the mid-shin. Prepping solution: ChloraPrep (2% chlorhexidine gluconate and 70% isopropyl alcohol) Safety Precautions: Aspiration looking for blood return was conducted prior to all injections. At no point did we inject any substances, as a needle was being advanced. No attempts were made at seeking any paresthesias. Safe injection practices and needle disposal techniques used. Medications properly checked for expiration dates. SDV (single dose vial) medications used. Description of the Procedure: Protocol guidelines were followed. The patient was placed in position over the fluoroscopy table. The target area was identified and the area prepped in the usual manner. Skin desensitized using vapocoolant spray. Skin & deeper tissues infiltrated with local anesthetic. Appropriate amount of time allowed to pass for local anesthetics to take effect. The procedure needles were then advanced to the target area. Proper needle placement secured. Negative aspiration confirmed. Solution injected in intermittent fashion, asking for systemic symptoms every 0.5cc of injectate. The needles were then removed and the area cleansed, making sure to leave some of the prepping solution back to take advantage of its long term bactericidal properties. Vitals:   01/21/17 0913 01/21/17 0914 01/21/17 0929 01/21/17 0934  BP:  (!)  184/94 (!) 154/93 (!) 173/89  Pulse:  67 65 65  Resp:  16 16 18   Temp:  98 F (36.7 C)    SpO2:  98% 95% 96%  Weight: 215 lb (97.5 kg) 215 lb (97.5 kg)    Height: 5\' 9"  (1.753 m) 5\' 9"  (1.753 m)      Start Time: 0929 hrs. End Time: 0933 hrs. Materials:  Needle(s) Type: Regular needle Gauge: 22G  Length: 3.5-in Medication(s): We administered lidocaine (PF), Sodium Hyaluronate, Sodium Hyaluronate, and ropivacaine (PF) 2 mg/mL (0.2%). Please see chart orders for dosing details.  Imaging Guidance:  Type of Imaging Technique: None used Indication(s): N/A Exposure Time: No patient exposure Contrast: None used. Fluoroscopic Guidance: N/A Ultrasound Guidance: N/A Interpretation: N/A  Antibiotic Prophylaxis:  Indication(s): None identified Antibiotic given: None  Post-operative Assessment:  EBL: None Complications: No immediate post-treatment complications observed by team, or reported by patient. Note: The patient tolerated the entire procedure well. A repeat set of vitals were taken after the procedure and the patient was kept under observation following institutional policy, for this type of procedure. Post-procedural neurological assessment was performed, showing return to baseline, prior to discharge. The patient was provided with post-procedure discharge instructions, including a section on how to identify potential problems. Should any problems arise concerning this procedure, the patient was given instructions to immediately contact us, at any time, without hesitation. In any case, we plan to contact the patient by telephone for a follow-up status report regarding this interventional procedure. Comments:  No additional relevant information.  Plan of Care   Imaging Orders  No imaging studies ordered today    Procedure Orders     KNEE INJECTION     KNEE INJECTION  Medications ordered for procedure: Meds ordered this encounter  Medications  . lidocaine (PF) (XYLOCAINE) 1 %  injection 5 mL  . Sodium Hyaluronate SOSY 2 mL  . Sodium Hyaluronate SOSY 2 mL  . ropivacaine (PF) 2 mg/mL (0.2%) (NAROPIN) injection 2 mL   Medications administered: We administered lidocaine (PF), Sodium Hyaluronate, Sodium Hyaluronate, and ropivacaine (PF) 2 mg/mL (0.2%).  See the medical record for exact dosing, route, and time of administration.  This SmartLink is deprecated. Use AVSMEDLIST instead to display the medication list for a patient. Disposition: Discharge home  Discharge Date & Time: 01/21/2017; 0935 hrs.   Physician-requested Follow-up: Return for Procedure (no sedation): (B) [place] Hyalgan Knee inj #2. Future Appointments  Date Time Provider [place]  02/11/2017  9:00 AM Milinda Pointer, MD [facility] None  04/06/2017  2:45 PM [facility] None  04/08/2017  1:45 PM [place] None   Primary Care Physician: Kirk Ruths, MD Location: [facility] Note by: Gaspar Cola, MD Date: 01/21/2017; Time: 10:24 AM  Disclaimer:  Medicine is not an exact science. The only guarantee in medicine is that nothing is guaranteed. It is important to note that the decision to proceed with this intervention was based on the information collected from the patient. The Data and conclusions were drawn from the patient's questionnaire, the interview, and the physical examination. Because the information was provided in large part by the patient, it cannot be guaranteed that it has not been purposely or unconsciously manipulated. Every effort has been made to obtain as much relevant data as possible for this evaluation. It is important to note that the conclusions that lead to this procedure are derived in large part from the available data. Always take into account that the treatment will also be dependent on availability of resources and existing treatment guidelines, considered by other Pain Management Practitioners as  being common knowledge and practice, at the time of the intervention. For Medico-Legal purposes, it is also important to point out that variation in procedural techniques and pharmacological choices are the acceptable norm. The indications, contraindications, technique, and results of the above procedure should only be interpreted and judged by a Board-Certified  Interventional Pain Specialist with extensive familiarity and expertise in the same exact procedure and technique.

## 2017-01-22 ENCOUNTER — Other Ambulatory Visit: Payer: Self-pay | Admitting: Orthopedic Surgery

## 2017-01-22 ENCOUNTER — Telehealth: Payer: Self-pay

## 2017-01-22 DIAGNOSIS — M1630 Unilateral osteoarthritis resulting from hip dysplasia, unspecified hip: Secondary | ICD-10-CM

## 2017-01-22 NOTE — Telephone Encounter (Signed)
Post Procedure phone call.  Patient states he  is do fine.

## 2017-01-29 ENCOUNTER — Ambulatory Visit
Admission: RE | Admit: 2017-01-29 | Discharge: 2017-01-29 | Disposition: A | Discharge status: Home / Self Care | Payer: Medicare Other | Source: Ambulatory Visit | Attending: Orthopedic Surgery | Admitting: Orthopedic Surgery

## 2017-01-29 DIAGNOSIS — M1632 Unilateral osteoarthritis resulting from hip dysplasia, left hip: Secondary | ICD-10-CM | POA: Insufficient documentation

## 2017-01-29 DIAGNOSIS — M1630 Unilateral osteoarthritis resulting from hip dysplasia, unspecified hip: Secondary | ICD-10-CM | POA: Diagnosis present

## 2017-01-29 DIAGNOSIS — M25452 Effusion, left hip: Secondary | ICD-10-CM | POA: Insufficient documentation

## 2017-02-10 NOTE — Progress Notes (Signed)
Patient's Name: Joshua Humphrey  MRN: [id number]  Referring Provider: Kirk Ruths, MD  DOB: 1930/01/08  PCP: Kirk Ruths, MD  DOS: 02/11/2017  Note by: Gaspar Cola, MD  Service setting: Ambulatory outpatient  Specialty: Interventional Pain Management  Patient type: Established  Location: [facility] (AMB) Pain Management Facility  Visit type: Interventional Procedure   Primary Reason for Visit: Interventional Pain Management Treatment. CC: Knee Pain (both knees)  Procedure:  Anesthesia, Analgesia, Anxiolysis:  Type: Therapeutic Intra-Articular Hyalgan Knee Injection #2 Region: Lateral infrapatellar Knee Region Level: Knee Joint Laterality: Bilateral  Type: Local Anesthesia Local Anesthetic: Lidocaine 1% Route: Infiltration ([place]/IM) IV Access: Declined Sedation: Declined  Indication(s): Analgesia           Indications: 1. Chronic knee pain (Bilateral) (L>R)   2. Osteoarthritis of knee (Bilateral)   3. Chronic pain syndrome    Pain Score: Pre-procedure: 0-No pain/10 Post-procedure: 0-No pain/10  Post-Procedure Assessment  01/21/2017 Procedure: Bilateral, therapeutic, intra-articular Hyalgan knee injection #1 Pre-procedure pain score:  4/10 Post-procedure pain score: 0/10 (100% relief) Influential Factors: BMI: 32.78 kg/m Intra-procedural challenges: None observed.         Assessment challenges: None detected.              Reported side-effects: None.        Post-procedural adverse reactions or complications: None reported         Sedation: No sedation used. When no sedatives are used, the analgesic levels obtained are directly associated to the effectiveness of the local anesthetics. However, when sedation is provided, the level of analgesia obtained during the initial 1 hour following the intervention, is believed to be the result of a combination of factors. These factors may include, but are not limited to: 1. The effectiveness of the local anesthetics  used. 2. The effects of the analgesic(s) and/or anxiolytic(s) used. 3. The degree of discomfort experienced by the patient at the time of the procedure. 4. The patients ability and reliability in recalling and recording the events. 5. The presence and influence of possible secondary gains and/or psychosocial factors. Reported result: Relief experienced during the 1st hour after the procedure: 100 % (Ultra-Short Term Relief)            Interpretative annotation: Clinically appropriate result. Analgesia during this period is likely to be Local Anesthetic and/or IV Sedative (Analgesic/Anxiolytic) related.          Effects of local anesthetic: The analgesic effects attained during this period are directly associated to the localized infiltration of local anesthetics and therefore cary significant diagnostic value as to the etiological location, or anatomical origin, of the pain. Expected duration of relief is directly dependent on the pharmacodynamics of the local anesthetic used. Long-acting (4-6 hours) anesthetics used.  Reported result: Relief during the next 4 to 6 hour after the procedure: 100 % (Short-Term Relief)            Interpretative annotation: Clinically appropriate result. Analgesia during this period is likely to be Local Anesthetic-related.          Long-term benefit: Defined as the period of time past the expected duration of local anesthetics (1 hour for short-acting and 4-6 hours for long-acting). With the possible exception of prolonged sympathetic blockade from the local anesthetics, benefits during this period are typically attributed to, or associated with, other factors such as analgesic sensory neuropraxia, antiinflammatory effects, or beneficial biochemical changes provided by agents other than the local anesthetics.  Reported result: Extended relief  following procedure: 100 %(on going) (Long-Term Relief) Excellent benefit from this gelastic treatment Interpretative annotation:  Clinically appropriate result. Good relief. No permanent benefit expected. Inflammation plays a part in the etiology to the pain.          Current benefits: Defined as reported results that persistent at this point in time.   Analgesia: 100 % Mr. Kiel reports improvement of arthralgia. Function: Mr. Meng reports improvement in function ROM: Mr. Minter reports improvement in ROM Interpretative annotation: Complete relief. No permanent benefit expected. Effective therapeutic approach.          Interpretation: Results would suggest therapy to have a positive impact on the patient's condition.                  Plan:  Proceed with treatment No.: 2  Pre-op Assessment:  Mr. Burgen is a 83 y.o. (year old), male patient, seen today for interventional treatment. He  has a past surgical history that includes tumor removed (2000); Prostate surgery (2002); lipoma removal (2000); Tonsillectomy; Cataract extraction, bilateral; Toe Surgery; Olecranon bursectomy (Left, 01/21/2016); Elbow surgery; and Eye surgery. Mr. Frett has a current medication list which includes the following prescription(s): acetaminophen, aspirin ec, atorvastatin, b-d ultrafine iii short pen, calcium carbonate-vitamin d, carvedilol, cetirizine, ferrous fumarate, flaxseed (linseed), fluticasone, furosemide, gabapentin, glucosamine-chondroitin, hydralazine, ipratropium-albuterol, levemir flextouch, losartan, metformin, mometasone-formoterol, montelukast, ocuvite adult 50+, nifedipine, omeprazole, potassium chloride sa, pramipexole, sitagliptin, and tramadol. His primarily concern today is the Knee Pain (both knees)  Initial Vital Signs: There were no vitals taken for this visit. BMI: Estimated body mass index is 32.78 kg/m as calculated from the following:   Height as of this encounter: 5\' 9"  (1.753 m).   Weight as of this encounter: 222 lb (100.7 kg).  Risk Assessment: Allergies: Reviewed. He has No Known Allergies.  Allergy Precautions:  None required Coagulopathies: Reviewed. None identified.  Blood-thinner therapy: None at this time Active Infection(s): Reviewed. None identified. Mr. Lovelady is afebrile  Site Confirmation: Mr. Drees was asked to confirm the procedure and laterality before marking the site Procedure checklist: Completed Consent: Before the procedure and under the influence of no sedative(s), amnesic(s), or anxiolytics, the patient was informed of the treatment options, risks and possible complications. To fulfill our ethical and legal obligations, as recommended by the American Medical Association's Code of Ethics, I have informed the patient of my clinical impression; the nature and purpose of the treatment or procedure; the risks, benefits, and possible complications of the intervention; the alternatives, including doing nothing; the risk(s) and benefit(s) of the alternative treatment(s) or procedure(s); and the risk(s) and benefit(s) of doing nothing. The patient was provided information about the general risks and possible complications associated with the procedure. These may include, but are not limited to: failure to achieve desired goals, infection, bleeding, organ or nerve damage, allergic reactions, paralysis, and death. In addition, the patient was informed of those risks and complications associated to the procedure, such as failure to decrease pain; infection; bleeding; organ or nerve damage with subsequent damage to sensory, motor, and/or autonomic systems, resulting in permanent pain, numbness, and/or weakness of one or several areas of the body; allergic reactions; (i.e.: anaphylactic reaction); and/or death. Furthermore, the patient was informed of those risks and complications associated with the medications. These include, but are not limited to: allergic reactions (i.e.: anaphylactic or anaphylactoid reaction(s)); adrenal axis suppression; blood sugar elevation that in diabetics may result in  ketoacidosis or comma; water retention that in patients with  history of congestive heart failure may result in shortness of breath, pulmonary edema, and decompensation with resultant heart failure; weight gain; swelling or edema; medication-induced neural toxicity; particulate matter embolism and blood vessel occlusion with resultant organ, and/or nervous system infarction; and/or aseptic necrosis of one or more joints. Finally, the patient was informed that Medicine is not an exact science; therefore, there is also the possibility of unforeseen or unpredictable risks and/or possible complications that may result in a catastrophic outcome. The patient indicated having understood very clearly. We have given the patient no guarantees and we have made no promises. Enough time was given to the patient to ask questions, all of which were answered to the patient's satisfaction. Mr. Carchi has indicated that he wanted to continue with the procedure. Attestation: I, the ordering provider, attest that I have discussed with the patient the benefits, risks, side-effects, alternatives, likelihood of achieving goals, and potential problems during recovery for the procedure that I have provided informed consent. Date: 02/11/2017; Time: 5:04 PM  Pre-Procedure Preparation:  Monitoring: As per clinic protocol. Respiration, ETCO2, SpO2, BP, heart rate and rhythm monitor placed and checked for adequate function Safety Precautions: Patient was assessed for positional comfort and pressure points before starting the procedure. Time-out: I initiated and conducted the "Time-out" before starting the procedure, as per protocol. The patient was asked to participate by confirming the accuracy of the "Time Out" information. Verification of the correct person, site, and procedure were performed and confirmed by me, the nursing staff, and the patient. "Time-out" conducted as per Joint Commission's Universal Protocol  (UP.01.01.01). "Time-out" Date & Time: 02/11/2017; 0939 hrs.  Description of Procedure Process:   Position: Sitting Target Area: Knee Joint Approach: Just above the Lateral tibial plateau, lateral to the infrapatellar tendon. Area Prepped: Entire knee area, from the mid-thigh to the mid-shin. Prepping solution: ChloraPrep (2% chlorhexidine gluconate and 70% isopropyl alcohol) Safety Precautions: Aspiration looking for blood return was conducted prior to all injections. At no point did we inject any substances, as a needle was being advanced. No attempts were made at seeking any paresthesias. Safe injection practices and needle disposal techniques used. Medications properly checked for expiration dates. SDV (single dose vial) medications used. Description of the Procedure: Protocol guidelines were followed. The patient was placed in position over the fluoroscopy table. The target area was identified and the area prepped in the usual manner. Skin desensitized using vapocoolant spray. Skin & deeper tissues infiltrated with local anesthetic. Appropriate amount of time allowed to pass for local anesthetics to take effect. The procedure needles were then advanced to the target area. Proper needle placement secured. Negative aspiration confirmed. Solution injected in intermittent fashion, asking for systemic symptoms every 0.5cc of injectate. The needles were then removed and the area cleansed, making sure to leave some of the prepping solution back to take advantage of its long term bactericidal properties. Vitals:   02/11/17 0913 02/11/17 0946  BP: (!) 156/79 138/78  Pulse: 70 63  Resp:  16  Temp: 97.7 F (36.5 C)   SpO2: 97% 94%  Weight: 222 lb (100.7 kg)   Height: 5\' 9"  (1.753 m)     Start Time: 0939 hrs. End Time: 0944 hrs. Materials:  Needle(s) Type: Regular needle Gauge: 22G Length: 3.5-in Medication(s): We administered lidocaine (PF), ropivacaine (PF) 2 mg/mL (0.2%), Sodium Hyaluronate,  and Sodium Hyaluronate. Please see chart orders for dosing details.  Imaging Guidance:  Type of Imaging Technique: None used Indication(s): N/A Exposure Time: No patient  exposure Contrast: None used. Fluoroscopic Guidance: N/A Ultrasound Guidance: N/A Interpretation: N/A  Antibiotic Prophylaxis:  Indication(s): None identified Antibiotic given: None  Post-operative Assessment:  EBL: None Complications: No immediate post-treatment complications observed by team, or reported by patient. Note: The patient tolerated the entire procedure well. A repeat set of vitals were taken after the procedure and the patient was kept under observation following institutional policy, for this type of procedure. Post-procedural neurological assessment was performed, showing return to baseline, prior to discharge. The patient was provided with post-procedure discharge instructions, including a section on how to identify potential problems. Should any problems arise concerning this procedure, the patient was given instructions to immediately contact us, at any time, without hesitation. In any case, we plan to contact the patient by telephone for a follow-up status report regarding this interventional procedure. Comments:  No additional relevant information.  Plan of Care   Imaging Orders  No imaging studies ordered today    Procedure Orders     KNEE INJECTION     KNEE INJECTION  Medications ordered for procedure: Meds ordered this encounter  Medications  . lidocaine (PF) (XYLOCAINE) 1 % injection 5 mL  . ropivacaine (PF) 2 mg/mL (0.2%) (NAROPIN) injection 2 mL  . Sodium Hyaluronate SOSY 2 mL  . Sodium Hyaluronate SOSY 2 mL  . traMADol (ULTRAM) 50 MG tablet    Sig: Take 1 tablet (50 mg total) by mouth daily as needed for severe pain.    Dispense:  60 tablet    Refill:  0    Do not place this medication, or any other prescription from our practice, on "Automatic Refill". Patient may have  prescription filled one day early if pharmacy is closed on scheduled refill date. Do not fill until: 02/11/17 To last until: 04/12/17   Medications administered: We administered lidocaine (PF), ropivacaine (PF) 2 mg/mL (0.2%), Sodium Hyaluronate, and Sodium Hyaluronate.  See the medical record for exact dosing, route, and time of administration.  This SmartLink is deprecated. Use AVSMEDLIST instead to display the medication list for a patient. Disposition: Discharge home  Discharge Date & Time: 02/11/2017; 0953 hrs.   Physician-requested Follow-up: Return in about 2 weeks (around 02/25/2017) for Procedure (no sedation): (B) [place] Hyalgan #3. Future Appointments  Date Time Provider [place]  02/25/2017  9:00 AM Milinda Pointer, MD [facility] None  04/06/2017  2:45 PM [facility] None  04/08/2017  1:45 PM [facility] None   Primary Care Physician: Kirk Ruths, MD Location: [facility] Note by: Gaspar Cola, MD Date: 02/11/2017; Time: 10:31 AM  Disclaimer:  Medicine is not an exact science. The only guarantee in medicine is that nothing is guaranteed. It is important to note that the decision to proceed with this intervention was based on the information collected from the patient. The Data and conclusions were drawn from the patient's questionnaire, the interview, and the physical examination. Because the information was provided in large part by the patient, it cannot be guaranteed that it has not been purposely or unconsciously manipulated. Every effort has been made to obtain as much relevant data as possible for this evaluation. It is important to note that the conclusions that lead to this procedure are derived in large part from the available data. Always take into account that the treatment will also be dependent on availability of resources and existing treatment guidelines, considered by other Pain Management  Practitioners as being common knowledge and practice, at the  time of the intervention. For Medico-Legal purposes, it is also important to point out that variation in procedural techniques and pharmacological choices are the acceptable norm. The indications, contraindications, technique, and results of the above procedure should only be interpreted and judged by a Board-Certified Interventional Pain Specialist with extensive familiarity and expertise in the same exact procedure and technique.

## 2017-02-11 ENCOUNTER — Encounter: Payer: Self-pay | Admitting: Pain Medicine

## 2017-02-11 ENCOUNTER — Other Ambulatory Visit: Payer: Self-pay

## 2017-02-11 ENCOUNTER — Ambulatory Visit: Payer: Medicare Other | Attending: Pain Medicine | Admitting: Pain Medicine

## 2017-02-11 VITALS — BP 138/78 | HR 63 | Temp 97.7°F | Resp 16 | Ht 69.0 in | Wt 222.0 lb

## 2017-02-11 DIAGNOSIS — M170 Bilateral primary osteoarthritis of knee: Secondary | ICD-10-CM

## 2017-02-11 DIAGNOSIS — M25562 Pain in left knee: Secondary | ICD-10-CM | POA: Diagnosis not present

## 2017-02-11 DIAGNOSIS — M25561 Pain in right knee: Secondary | ICD-10-CM | POA: Diagnosis not present

## 2017-02-11 DIAGNOSIS — G8929 Other chronic pain: Secondary | ICD-10-CM | POA: Diagnosis not present

## 2017-02-11 DIAGNOSIS — G894 Chronic pain syndrome: Secondary | ICD-10-CM

## 2017-02-11 MED ORDER — ROPIVACAINE HCL 2 MG/ML IJ SOLN
2.0000 mL | Freq: Once | INTRAMUSCULAR | Status: AC
  Administered 2017-02-11: 10 mL via INTRA_ARTICULAR
  Filled 2017-02-11: qty 10

## 2017-02-11 MED ORDER — TRAMADOL HCL 50 MG PO TABS: 50.0000 mg | ORAL_TABLET | Freq: Every day | ORAL | 0 refills | Status: DC | PRN

## 2017-02-11 MED ORDER — SODIUM HYALURONATE (VISCOSUP) 20 MG/2ML IX SOSY
2.0000 mL | PREFILLED_SYRINGE | Freq: Once | INTRA_ARTICULAR | Status: AC
  Administered 2017-02-11: 10:00:00 via INTRA_ARTICULAR
  Filled 2017-02-11: qty 2

## 2017-02-11 MED ORDER — LIDOCAINE HCL (PF) 1 % IJ SOLN
5.0000 mL | Freq: Once | INTRAMUSCULAR | Status: AC
  Administered 2017-02-11: 5 mL
  Filled 2017-02-11: qty 5

## 2017-02-11 NOTE — Patient Instructions (Addendum)
____________________________________________________________________________________________  Post-Procedure instructions Instructions:  Apply ice: Fill a plastic sandwich bag with crushed ice. Cover it with a small towel and apply to injection site. Apply for 15 minutes then remove x 15 minutes. Repeat sequence on day of procedure, until you go to bed. The purpose is to minimize swelling and discomfort after procedure.  Apply heat: Apply heat to procedure site starting the day following the procedure. The purpose is to treat any soreness and discomfort from the procedure.  Food intake: Start with clear liquids (like water) and advance to regular food, as tolerated.   Physical activities: Keep activities to a minimum for the first 8 hours after the procedure.   Driving: If you have received any sedation, you are not allowed to drive for 24 hours after your procedure.  Blood thinner: Restart your blood thinner 6 hours after your procedure. (Only for those taking blood thinners)  Insulin: As soon as you can eat, you may resume your normal dosing schedule. (Only for those taking insulin)  Infection prevention: Keep procedure site clean and dry.  Post-procedure Pain Diary: Extremely important that this be done correctly and accurately. Recorded information will be used to determine the next step in treatment.  Pain evaluated is that of treated area only. Do not include pain from an untreated area.  Complete every hour, on the hour, for the initial 8 hours. Set an alarm to help you do this part accurately.  Do not go to sleep and have it completed later. It will not be accurate.  Follow-up appointment: Keep your follow-up appointment after the procedure. Usually 2 weeks for most procedures. (6 weeks in the case of radiofrequency.) Bring you pain diary.  Expect:  From numbing medicine (AKA: Local Anesthetics): Numbness or decrease in pain.  Onset: Full effect within 15 minutes of  injected.  Duration: It will depend on the type of local anesthetic used. On the average, 1 to 8 hours.   From steroids: Decrease in swelling or inflammation. Once inflammation is improved, relief of the pain will follow.  Onset of benefits: Depends on the amount of swelling present. The more swelling, the longer it will take for the benefits to be seen. In some cases, up to 10 days.  Duration: Steroids will stay in the system x 2 weeks. Duration of benefits will depend on multiple posibilities including persistent irritating factors.  From procedure: Some discomfort is to be expected once the numbing medicine wears off. This should be minimal if ice and heat are applied as instructed. Call if:  You experience numbness and weakness that gets worse with time, as opposed to wearing off.  New onset bowel or bladder incontinence. (Spinal procedures only)  Emergency Numbers:  Durning business hours (Monday - Thursday, 8:00 AM - 4:00 PM) (Friday, 9:00 AM - 12:00 Noon): [phone]  After hours: [phone] ____________________________________________________________________________________________   ____________________________________________________________________________________________  Preparing for your procedure (without sedation) Instructions: . Oral Intake: Do not eat or drink anything for at least 3 hours prior to your procedure. . Transportation: Unless otherwise stated by your physician, you may drive yourself after the procedure. . Blood Pressure Medicine: Take your blood pressure medicine with a sip of water the morning of the procedure. . Blood thinners:  . Diabetics on insulin: Notify the staff so that you can be scheduled 1st case in the morning. If your diabetes requires high dose insulin, take only  of your normal insulin dose the morning of the procedure and notify the  staff that you have done so. . Preventing infections: Shower with an antibacterial soap the  morning of your procedure.  . Build-up your immune system: Take 1000 mg of Vitamin C with every meal (3 times a day) the day prior to your procedure. [place] Antibiotics: Inform the staff if you have a condition or reason that requires you to take antibiotics before dental procedures. . Pregnancy: If you are pregnant, call and cancel the procedure. . Sickness: If you have a cold, fever, or any active infections, call and cancel the procedure. . Arrival: You must be in the facility at least 30 minutes prior to your scheduled procedure. . Children: Do not bring any children with you. . Dress appropriately: Bring dark clothing that you would not mind if they get stained. . Valuables: Do not bring any jewelry or valuables. Procedure appointments are reserved for interventional treatments only. [place] Kitchen No Prescription Refills. . No medication changes will be discussed during procedure appointments. . No disability issues will be discussed. ____________________________________________________________________________________________    ____________________________________________________________________________________________  Medication Rules  Applies to: All patients receiving prescriptions (written or electronic).  Pharmacy of record: Pharmacy where electronic prescriptions will be sent. If written prescriptions are taken to a different pharmacy, please inform the nursing staff. The pharmacy listed in the electronic medical record should be the one where you would like electronic prescriptions to be sent.  Prescription refills: Only during scheduled appointments. Applies to both, written and electronic prescriptions.  NOTE: The following applies primarily to controlled substances (Opioid* Pain Medications).   Patient's responsibilities: 1. Pain Pills: Bring all pain pills to every appointment (except for procedure appointments). 2. Pill Bottles: Bring pills in original pharmacy bottle. Always bring  newest bottle. Bring bottle, even if empty. 3. Medication refills: You are responsible for knowing and keeping track of what medications you need refilled. The day before your appointment, write a list of all prescriptions that need to be refilled. Bring that list to your appointment and give it to the admitting nurse. Prescriptions will be written only during appointments. If you forget a medication, it will not be "Called in", "Faxed", or "electronically sent". You will need to get another appointment to get these prescribed. 4. Prescription Accuracy: You are responsible for carefully inspecting your prescriptions before leaving our office. Have the discharge nurse carefully go over each prescription with you, before taking them home. Make sure that your name is accurately spelled, that your address is correct. Check the name and dose of your medication to make sure it is accurate. Check the number of pills, and the written instructions to make sure they are clear and accurate. Make sure that you are given enough medication to last until your next medication refill appointment. 5. Taking Medication: Take medication as prescribed. Never take more pills than instructed. Never take medication more frequently than prescribed. Taking less pills or less frequently is permitted and encouraged, when it comes to controlled substances (written prescriptions).  6. Inform other Doctors: Always inform, all of your healthcare providers, of all the medications you take. 7. Pain Medication from other Providers: You are not allowed to accept any additional pain medication from any other Doctor or Healthcare provider. There are two exceptions to this rule. (see below) In the event that you require additional pain medication, you are responsible for notifying us, as stated below. 8. Medication Agreement: You are responsible for carefully reading and following our Medication Agreement. This must be signed before receiving any  prescriptions from our  practice. Safely store a copy of your signed Agreement. Violations to the Agreement will result in no further prescriptions. (Additional copies of our Medication Agreement are available upon request.) 9. Laws, Rules, & Regulations: All patients are expected to follow all Federal and [employer], [employer], Rules, [employer]. Ignorance of the Laws does not constitute a valid excuse. The use of any illegal substances is prohibited. 10. Adopted CDC guidelines & recommendations: Target dosing levels will be at or below 60 MME/day. Use of benzodiazepines** is not recommended.  Exceptions: There are only two exceptions to the rule of not receiving pain medications from other Healthcare Providers. 1. Exception #1 (Emergencies): In the event of an emergency (i.e.: accident requiring emergency care), you are allowed to receive additional pain medication. However, you are responsible for: As soon as you are able, call our office [phone], at any time of the day or night, and leave a message stating your name, the date and nature of the emergency, and the name and dose of the medication prescribed. In the event that your call is answered by a member of our staff, make sure to document and save the date, time, and the name of the person that took your information.  2. Exception #2 (Planned Surgery): In the event that you are scheduled by another doctor or dentist to have any type of surgery or procedure, you are allowed (for a period no longer than 30 days), to receive additional pain medication, for the acute post-op pain. However, in this case, you are responsible for picking up a copy of our "Post-op Pain Management for Surgeons" handout, and giving it to your surgeon or dentist. This document is available at our office, and does not require an appointment to obtain it. Simply go to our office during business hours (Monday-Thursday from 8:00 AM to 4:00 PM) (Friday 8:00 AM to 12:00 Noon) or  if you have a scheduled appointment with [place], prior to your surgery, and ask for it by name. In addition, you will need to provide [place] with your name, name of your surgeon, type of surgery, and date of procedure or surgery.  *Opioid medications include: morphine, codeine, oxycodone, oxymorphone, hydrocodone, hydromorphone, meperidine, tramadol, tapentadol, buprenorphine, fentanyl, methadone. **Benzodiazepine medications include: diazepam (Valium), alprazolam (Xanax), clonazepam (Klonopine), lorazepam (Ativan), clorazepate (Tranxene), chlordiazepoxide (Librium), estazolam (Prosom), oxazepam (Serax), temazepam (Restoril), triazolam (Halcion)  ____________________________________________________________________________________________

## 2017-02-12 ENCOUNTER — Telehealth: Payer: Self-pay

## 2017-02-12 NOTE — Telephone Encounter (Signed)
Post procedure phone call.  Left message.  

## 2017-02-25 ENCOUNTER — Other Ambulatory Visit: Payer: Self-pay

## 2017-02-25 ENCOUNTER — Ambulatory Visit: Payer: Medicare Other | Attending: Pain Medicine | Admitting: Pain Medicine

## 2017-02-25 ENCOUNTER — Encounter: Payer: Self-pay | Admitting: Pain Medicine

## 2017-02-25 VITALS — BP 134/72 | HR 69 | Temp 97.9°F | Resp 16 | Ht 69.0 in | Wt 222.0 lb

## 2017-02-25 DIAGNOSIS — G8929 Other chronic pain: Secondary | ICD-10-CM | POA: Diagnosis not present

## 2017-02-25 DIAGNOSIS — M25552 Pain in left hip: Secondary | ICD-10-CM | POA: Diagnosis not present

## 2017-02-25 DIAGNOSIS — M25562 Pain in left knee: Secondary | ICD-10-CM | POA: Insufficient documentation

## 2017-02-25 DIAGNOSIS — M25561 Pain in right knee: Secondary | ICD-10-CM | POA: Insufficient documentation

## 2017-02-25 DIAGNOSIS — M170 Bilateral primary osteoarthritis of knee: Secondary | ICD-10-CM | POA: Diagnosis present

## 2017-02-25 MED ORDER — LIDOCAINE HCL (PF) 1 % IJ SOLN
5.0000 mL | Freq: Once | INTRAMUSCULAR | Status: AC
  Administered 2017-02-25: 5 mL

## 2017-02-25 MED ORDER — ROPIVACAINE HCL 2 MG/ML IJ SOLN
2.0000 mL | Freq: Once | INTRAMUSCULAR | Status: AC
  Administered 2017-02-25: 10 mL via INTRA_ARTICULAR

## 2017-02-25 MED ORDER — SODIUM HYALURONATE (VISCOSUP) 20 MG/2ML IX SOSY
2.0000 mL | PREFILLED_SYRINGE | Freq: Once | INTRA_ARTICULAR | Status: AC
  Administered 2017-02-25: 2 mL via INTRA_ARTICULAR

## 2017-02-25 MED ORDER — LIDOCAINE HCL (PF) 1 % IJ SOLN
INTRAMUSCULAR | Status: AC
  Filled 2017-02-25: qty 5

## 2017-02-25 MED ORDER — ROPIVACAINE HCL 2 MG/ML IJ SOLN
INTRAMUSCULAR | Status: AC
  Filled 2017-02-25: qty 10

## 2017-02-25 NOTE — Progress Notes (Signed)
Safety precautions to be maintained throughout the outpatient stay will include: orient to surroundings, keep bed in low position, maintain call bell within reach at all times, provide assistance with transfer out of bed and ambulation.  

## 2017-02-25 NOTE — Progress Notes (Signed)
Patient's Name: Joshua Humphrey  MRN: [id number]  Referring Provider: Kirk Ruths, MD  DOB: 05/28/1929  PCP: Kirk Ruths, MD  DOS: 02/25/2017  Note by: Gaspar Cola, MD  Service setting: Ambulatory outpatient  Specialty: Interventional Pain Management  Patient type: Established  Location: [facility] (AMB) Pain Management Facility  Visit type: Interventional Procedure   Primary Reason for Visit: Interventional Pain Management Treatment. CC: Knee Pain (Right) and Hip Pain (left)  Procedure:  Anesthesia, Analgesia, Anxiolysis:  Type: Therapeutic Intra-Articular Hyalgan Knee Injection #3 Region: Lateral infrapatellar Knee Region Level: Knee Joint Laterality: Bilateral  Type: Local Anesthesia Local Anesthetic: Lidocaine 1% Route: Infiltration ([place]/IM) IV Access: Declined Sedation: Declined  Indication(s): Analgesia           Indications: 1. Chronic knee pain (Bilateral) (L>R)   2. Osteoarthritis of knee (Bilateral)    Pain Score: Pre-procedure: 3 /10 Post-procedure: 0-No pain/10  Pre-op Assessment:  Joshua Humphrey is a 83 y.o. (year old), male patient, seen today for interventional treatment. He  has a past surgical history that includes tumor removed (2000); Prostate surgery (2002); lipoma removal (2000); Tonsillectomy; Cataract extraction, bilateral; Toe Surgery; Olecranon bursectomy (Left, 01/21/2016); Elbow surgery; and Eye surgery. Joshua Humphrey has a current medication list which includes the following prescription(s): acetaminophen, aspirin ec, atorvastatin, b-d ultrafine iii short pen, calcium carbonate-vitamin d, carvedilol, cetirizine, ferrous fumarate, flaxseed (linseed), fluticasone, furosemide, gabapentin, glucosamine-chondroitin, hydralazine, ipratropium-albuterol, levemir flextouch, losartan, magnesium oxide, metformin, mometasone-formoterol, montelukast, ocuvite adult 50+, nifedipine, omeprazole, potassium chloride sa, pramipexole, sitagliptin, and tramadol. His  primarily concern today is the Knee Pain (Right) and Hip Pain (left)  Initial Vital Signs: There were no vitals taken for this visit. BMI: Estimated body mass index is 32.78 kg/m as calculated from the following:   Height as of this encounter: 5\' 9"  (1.753 m).   Weight as of this encounter: 222 lb (100.7 kg).  Risk Assessment: Allergies: Reviewed. He has No Known Allergies.  Allergy Precautions: None required Coagulopathies: Reviewed. None identified.  Blood-thinner therapy: None at this time Active Infection(s): Reviewed. None identified. Joshua Humphrey is afebrile  Site Confirmation: Joshua Humphrey was asked to confirm the procedure and laterality before marking the site Procedure checklist: Completed Consent: Before the procedure and under the influence of no sedative(s), amnesic(s), or anxiolytics, the patient was informed of the treatment options, risks and possible complications. To fulfill our ethical and legal obligations, as recommended by the American Medical Association's Code of Ethics, I have informed the patient of my clinical impression; the nature and purpose of the treatment or procedure; the risks, benefits, and possible complications of the intervention; the alternatives, including doing nothing; the risk(s) and benefit(s) of the alternative treatment(s) or procedure(s); and the risk(s) and benefit(s) of doing nothing. The patient was provided information about the general risks and possible complications associated with the procedure. These may include, but are not limited to: failure to achieve desired goals, infection, bleeding, organ or nerve damage, allergic reactions, paralysis, and death. In addition, the patient was informed of those risks and complications associated to the procedure, such as failure to decrease pain; infection; bleeding; organ or nerve damage with subsequent damage to sensory, motor, and/or autonomic systems, resulting in permanent pain, numbness, and/or  weakness of one or several areas of the body; allergic reactions; (i.e.: anaphylactic reaction); and/or death. Furthermore, the patient was informed of those risks and complications associated with the medications. These include, but are not limited to: allergic reactions (i.e.: anaphylactic or anaphylactoid reaction(s));  adrenal axis suppression; blood sugar elevation that in diabetics may result in ketoacidosis or comma; water retention that in patients with history of congestive heart failure may result in shortness of breath, pulmonary edema, and decompensation with resultant heart failure; weight gain; swelling or edema; medication-induced neural toxicity; particulate matter embolism and blood vessel occlusion with resultant organ, and/or nervous system infarction; and/or aseptic necrosis of one or more joints. Finally, the patient was informed that Medicine is not an exact science; therefore, there is also the possibility of unforeseen or unpredictable risks and/or possible complications that may result in a catastrophic outcome. The patient indicated having understood very clearly. We have given the patient no guarantees and we have made no promises. Enough time was given to the patient to ask questions, all of which were answered to the patient's satisfaction. Joshua Humphrey has indicated that he wanted to continue with the procedure. Attestation: I, the ordering provider, attest that I have discussed with the patient the benefits, risks, side-effects, alternatives, likelihood of achieving goals, and potential problems during recovery for the procedure that I have provided informed consent. Date: 02/25/2017; Time: 7:47 AM  Pre-Procedure Preparation:  Monitoring: As per clinic protocol. Respiration, ETCO2, SpO2, BP, heart rate and rhythm monitor placed and checked for adequate function Safety Precautions: Patient was assessed for positional comfort and pressure points before starting the  procedure. Time-out: I initiated and conducted the "Time-out" before starting the procedure, as per protocol. The patient was asked to participate by confirming the accuracy of the "Time Out" information. Verification of the correct person, site, and procedure were performed and confirmed by me, the nursing staff, and the patient. "Time-out" conducted as per Joint Commission's Universal Protocol (UP.01.01.01). "Time-out" Date & Time: 02/25/2017; 0950 hrs.  Description of Procedure Process:   Position: Sitting Target Area: Knee Joint Approach: Just above the Lateral tibial plateau, lateral to the infrapatellar tendon. Area Prepped: Entire knee area, from the mid-thigh to the mid-shin. Prepping solution: ChloraPrep (2% chlorhexidine gluconate and 70% isopropyl alcohol) Safety Precautions: Aspiration looking for blood return was conducted prior to all injections. At no point did we inject any substances, as a needle was being advanced. No attempts were made at seeking any paresthesias. Safe injection practices and needle disposal techniques used. Medications properly checked for expiration dates. SDV (single dose vial) medications used. Description of the Procedure: Protocol guidelines were followed. The patient was placed in position over the fluoroscopy table. The target area was identified and the area prepped in the usual manner. Skin desensitized using vapocoolant spray. Skin & deeper tissues infiltrated with local anesthetic. Appropriate amount of time allowed to pass for local anesthetics to take effect. The procedure needles were then advanced to the target area. Proper needle placement secured. Negative aspiration confirmed. Solution injected in intermittent fashion, asking for systemic symptoms every 0.5cc of injectate. The needles were then removed and the area cleansed, making sure to leave some of the prepping solution back to take advantage of its long term bactericidal properties. Vitals:    02/25/17 0940 02/25/17 0946 02/25/17 0952 02/25/17 0954  BP: 130/72 128/76 123/70 134/72  Pulse: 61 70 65 69  Resp: 16 16 16 16   Temp:      TempSrc:      SpO2: 96% 95% 96% 98%  Weight:      Height:        Start Time: 0950 hrs. End Time: 0953 hrs. Materials:  Needle(s) Type: Regular needle Gauge: 22G Length: 3.5-in Medication(s): We administered  lidocaine (PF), ropivacaine (PF) 2 mg/mL (0.2%), Sodium Hyaluronate, and Sodium Hyaluronate. Please see chart orders for dosing details.  Imaging Guidance:  Type of Imaging Technique: None used Indication(s): N/A Exposure Time: No patient exposure Contrast: None used. Fluoroscopic Guidance: N/A Ultrasound Guidance: N/A Interpretation: N/A  Antibiotic Prophylaxis:  Indication(s): None identified Antibiotic given: None  Post-operative Assessment:  EBL: None Complications: No immediate post-treatment complications observed by team, or reported by patient. Note: The patient tolerated the entire procedure well. A repeat set of vitals were taken after the procedure and the patient was kept under observation following institutional policy, for this type of procedure. Post-procedural neurological assessment was performed, showing return to baseline, prior to discharge. The patient was provided with post-procedure discharge instructions, including a section on how to identify potential problems. Should any problems arise concerning this procedure, the patient was given instructions to immediately contact us, at any time, without hesitation. In any case, we plan to contact the patient by telephone for a follow-up status report regarding this interventional procedure. Comments:  No additional relevant information.  Plan of Care   Imaging Orders  No imaging studies ordered today    Procedure Orders     KNEE INJECTION     KNEE INJECTION  Medications ordered for procedure: Meds ordered this encounter  Medications  . lidocaine (PF)  (XYLOCAINE) 1 % injection 5 mL  . ropivacaine (PF) 2 mg/mL (0.2%) (NAROPIN) injection 2 mL  . Sodium Hyaluronate SOSY 2 mL  . Sodium Hyaluronate SOSY 2 mL   Medications administered: We administered lidocaine (PF), ropivacaine (PF) 2 mg/mL (0.2%), Sodium Hyaluronate, and Sodium Hyaluronate.  See the medical record for exact dosing, route, and time of administration.  This SmartLink is deprecated. Use AVSMEDLIST instead to display the medication list for a patient. Disposition: Discharge home  Discharge Date & Time: 02/25/2017; 0956 hrs.   Physician-requested Follow-up: Return for Procedure (no sedation): (B) Hyalgan #4. Future Appointments  Date Time Provider [place]  03/25/2017  9:00 AM Milinda Pointer, MD [facility] None  04/06/2017  2:45 PM [facility] None  04/08/2017  1:45 PM [place] None   Primary Care Physician: Kirk Ruths, MD Location: [facility] Note by: Gaspar Cola, MD Date: 02/25/2017; Time: 10:02 AM  Disclaimer:  Medicine is not an exact science. The only guarantee in medicine is that nothing is guaranteed. It is important to note that the decision to proceed with this intervention was based on the information collected from the patient. The Data and conclusions were drawn from the patient's questionnaire, the interview, and the physical examination. Because the information was provided in large part by the patient, it cannot be guaranteed that it has not been purposely or unconsciously manipulated. Every effort has been made to obtain as much relevant data as possible for this evaluation. It is important to note that the conclusions that lead to this procedure are derived in large part from the available data. Always take into account that the treatment will also be dependent on availability of resources and existing treatment guidelines, considered by other Pain Management Practitioners as  being common knowledge and practice, at the time of the intervention. For Medico-Legal purposes, it is also important to point out that variation in procedural techniques and pharmacological choices are the acceptable norm. The indications, contraindications, technique, and results of the above procedure should only be interpreted and judged by a Board-Certified Interventional Pain Specialist with extensive familiarity and expertise  in the same exact procedure and technique.

## 2017-02-25 NOTE — Patient Instructions (Addendum)
Post-procedure Information What to expect: Most procedures involve the use of a local anesthetic (numbing medicine), and a steroid (anti-inflammatory medicine).  The local anesthetics may cause temporary numbness and weakness of the legs or arms, depending on the location of the block. This numbness/weakness may last 4-6 hours, depending on the local anesthetic used. In rare instances, it can last up to 24 hours. While numb, you must be very careful not to injure the extremity.  After any procedure, you could expect the pain to get better within 15-20 minutes. This relief is temporary and may last 4-6 hours. Once the local anesthetics wears off, you could experience discomfort, possibly more than usual, for up to 10 (ten) days. In the case of radiofrequencies, it may last up to 6 weeks. Surgeries may take up to 8 weeks for the healing process. The discomfort is due to the irritation caused by needles going through skin and muscle. To minimize the discomfort, we recommend using ice the first day, and heat from then on. The ice should be applied for 15 minutes on, and 15 minutes off. Keep repeating this cycle until bedtime. Avoid applying the ice directly to the skin, to prevent frostbite. Heat should be used daily, until the pain improves (4-10 days). Be careful not to burn yourself.  Occasionally you may experience muscle spasms or cramps. These occur as a consequence of the irritation caused by the needle sticks to the muscle and the blood that will inevitably be lost into the surrounding muscle tissue. Blood tends to be very irritating to tissues, which tend to react by going into spasm. These spasms may start the same day of your procedure, but they may also take days to develop. This late onset type of spasm or cramp is usually caused by electrolyte imbalances triggered by the steroids, at the level of the kidney. Cramps and spasms tend to respond well to muscle relaxants, multivitamins (some are  triggered by the procedure, but may have their origins in vitamin deficiencies), and "Gatorade", or any sports drinks that can replenish any electrolyte imbalances. (If you are a diabetic, ask your pharmacist to get you a sugar-free brand.) Warm showers or baths may also be helpful. Stretching exercises are highly recommended. General Instructions:  Be alert for signs of possible infection: redness, swelling, heat, red streaks, elevated temperature, and/or fever. These typically appear 4 to 6 days after the procedure. Immediately notify your doctor if you experience unusual bleeding, difficulty breathing, or loss of bowel or bladder control. If you experience increased pain, do not increase your pain medicine intake, Knee Injection A knee injection is a procedure to get medicine into your knee joint. Your health care provider puts a needle into the joint and injects medicine with an attached syringe. The injected medicine may relieve the pain, swelling, and stiffness of arthritis. The injected medicine may also help to lubricate and cushion your knee joint. You may need more than one injection. Tell a health care provider about:  Any allergies you have.  All medicines you are taking, including vitamins, herbs, eye drops, creams, and over-the-counter medicines.  Any problems you or family members have had with anesthetic medicines.  Any blood disorders you have.  Any surgeries you have had.  Any medical conditions you have. What are the risks? Generally, this is a safe procedure. However, problems may occur, including:  Infection.  Bleeding.  Worsening symptoms.  Damage to the area around your knee.  Allergic reaction to any of the medicines.  Skin reactions from repeated injections.  What happens before the procedure?  Ask your health care provider about changing or stopping your regular medicines. This is especially important if you are taking diabetes medicines or blood  thinners.  Plan to have someone take you home after the procedure. What happens during the procedure?  You will sit or lie down in a position for your knee to be treated.  The skin over your kneecap will be cleaned with a germ-killing solution (antiseptic).  You will be given a medicine that numbs the area (local anesthetic). You may feel some stinging.  After your knee becomes numb, you will have a second injection. This is the medicine. This needle is carefully placed between your kneecap and your knee. The medicine is injected into the joint space.  At the end of the procedure, the needle will be removed.  A bandage (dressing) may be placed over the injection site. The procedure may vary among health care providers and hospitals. What happens after the procedure?  You may have to move your knee through its full range of motion. This helps to get all of the medicine into your joint space.  Your blood pressure, heart rate, breathing rate, and blood oxygen level will be monitored often until the medicines you were given have worn off.  You will be watched to make sure that you do not have a reaction to the injected medicine. This information is not intended to replace advice given to you by your health care provider. Make sure you discuss any questions you have with your health care provider. Document Released: 05/17/2006 Document Revised: 07/26/2015 Document Reviewed: 01/03/2014 Elsevier Interactive Patient Education  2018 [employer]. unless instructed by your pain physician. Post-Procedure Care:  Be careful in moving about. Muscle spasms in the area of the injection may occur. Applying ice or heat to the area is often helpful. The incidence of spinal headaches after epidural injections ranges between 1.4% and 6%. If you develop a headache that does not seem to respond to conservative therapy, please let your physician know. This can be treated with an epidural blood  patch.   Post-procedure numbness or redness is to be expected, however it should average 4 to 6 hours. If numbness and weakness of your extremities begins to develop 4 to 6 hours after your procedure, and is felt to be progressing and worsening, immediately contact your physician.   Diet:  If you experience nausea, do not eat until this sensation goes away. If you had a "Stellate Ganglion Block" for upper extremity "Reflex Sympathetic Dystrophy", do not eat or drink until your hoarseness goes away. In any case, always start with liquids first and if you tolerate them well, then slowly progress to more solid foods. Activity:  For the first 4 to 6 hours after the procedure, use caution in moving about as you may experience numbness and/or weakness. Use caution in cooking, using household electrical appliances, and climbing steps. If you need to reach your Doctor call our office: [phone] Monday-Thursday 8:00 am - 4:00 PM    Fridays: Closed     In case of an emergency: In case of emergency, call 911 or go to the nearest emergency room and have the physician there call us.  Interpretation of Procedure Every nerve block has two components: a diagnostic component, and a treatment component. Unrealistic expectations are the most common causes of "perceived failure".  In a perfect world, a single nerve block should be able  to completely and permanently eliminate the pain. Sadly, the world is not perfect.  Most pain management nerve blocks are performed using local anesthetics and steroids. Steroids are responsible for any long-term benefit that you may experience. Their purpose is to decrease any chronic swelling that may exist in the area. Steroids begin to work immediately after being injected. However, most patients will not experience any benefits until 5 to 10 days after the injection, when the swelling has come down to the point where they can tell a difference. Steroids will only help if there  is swelling to be treated. As such, they can assist with the diagnosis. If effective, they suggest an inflammatory component to the pain, and if ineffective, they rule out inflammation as the main cause or component of the problem. If the problem is one of mechanical compression, you will get no benefit from those steroids.   In the case of local anesthetics, they have a crucial role in the diagnosis of your condition. Most will begin to work within15 to 20 minutes after injection. The duration will depend on the type used (short- vs. Long-acting). It is of outmost importance that patients keep tract of their pain, after the procedure. To assist with this matter, a "Post-procedure Pain Diary" is provided. Make sure to complete it and to bring it back to your follow-up appointment.  As long as the patient keeps accurate, detailed records of their symptoms after every procedure, and returns to have those interpreted, every procedure will provide [place] with invaluable information. Even a block that does not provide the patient with any relief, will always provide [place] with information about the mechanism and the origin of the pain. The only time a nerve block can be considered a waste of time is when patients do not keep track of the results, or do not keep their post-procedure appointment.  Reporting the results back to your physician The Pain Score  Pain is a subjective complaint. It cannot be seen, touched, or measured. We depend entirely on the patient's report of the pain in order to assess your condition and treatment. To evaluate the pain, we use a pain scale, where "0" means "No Pain", and a "10" is "the worst possible pain that you can even imagine" (i.e. something like been eaten alive by a shark or being torn apart by a lion).   You will frequently be asked to rate your pain. Please be as accurate, remember that medical decisions will be based on your responses. Please do not rate your pain above a 10.  Doing so is actually interpreted as "symptom magnification" (exaggeration), as well as lack of understanding with regards to the scale. To put this into perspective, when you tell us that your pain is at a 10 (ten), what you are saying is that there is nothing we can do to make this pain any worse. (Carefully think about that.)  ____________________________________________________________________________________________  Post-Procedure instructions Instructions:  Apply ice: Fill a plastic sandwich bag with crushed ice. Cover it with a small towel and apply to injection site. Apply for 15 minutes then remove x 15 minutes. Repeat sequence on day of procedure, until you go to bed. The purpose is to minimize swelling and discomfort after procedure.  Apply heat: Apply heat to procedure site starting the day following the procedure. The purpose is to treat any soreness and discomfort from the procedure.  Food intake: Start with clear liquids (like water) and advance to regular food, as tolerated.  Physical activities: Keep activities to a minimum for the first 8 hours after the procedure.   Driving: If you have received any sedation, you are not allowed to drive for 24 hours after your procedure.  Blood thinner: Restart your blood thinner 6 hours after your procedure. (Only for those taking blood thinners)  Insulin: As soon as you can eat, you may resume your normal dosing schedule. (Only for those taking insulin)  Infection prevention: Keep procedure site clean and dry.  Post-procedure Pain Diary: Extremely important that this be done correctly and accurately. Recorded information will be used to determine the next step in treatment.  Pain evaluated is that of treated area only. Do not include pain from an untreated area.  Complete every hour, on the hour, for the initial 8 hours. Set an alarm to help you do this part accurately.  Do not go to sleep and have it completed later. It will not  be accurate.  Follow-up appointment: Keep your follow-up appointment after the procedure. Usually 2 weeks for most procedures. (6 weeks in the case of radiofrequency.) Bring you pain diary.  Expect:  From numbing medicine (AKA: Local Anesthetics): Numbness or decrease in pain.  Onset: Full effect within 15 minutes of injected.  Duration: It will depend on the type of local anesthetic used. On the average, 1 to 8 hours.   From steroids: Decrease in swelling or inflammation. Once inflammation is improved, relief of the pain will follow.  Onset of benefits: Depends on the amount of swelling present. The more swelling, the longer it will take for the benefits to be seen. In some cases, up to 10 days.  Duration: Steroids will stay in the system x 2 weeks. Duration of benefits will depend on multiple posibilities including persistent irritating factors.  From procedure: Some discomfort is to be expected once the numbing medicine wears off. This should be minimal if ice and heat are applied as instructed. Call if:  You experience numbness and weakness that gets worse with time, as opposed to wearing off.  New onset bowel or bladder incontinence. (Spinal procedures only)  Emergency Numbers:  Durning business hours (Monday - Thursday, 8:00 AM - 4:00 PM) (Friday, 9:00 AM - 12:00 Noon): [phone]  After hours: [phone] ____________________________________________________________________________________________   ____________________________________________________________________________________________  Preparing for your procedure (without sedation) Instructions: . Oral Intake: Do not eat or drink anything for at least 3 hours prior to your procedure. . Transportation: Unless otherwise stated by your physician, you may drive yourself after the procedure. . Blood Pressure Medicine: Take your blood pressure medicine with a sip of water the morning of the procedure. . Blood  thinners:  . Diabetics on insulin: Notify the staff so that you can be scheduled 1st case in the morning. If your diabetes requires high dose insulin, take only  of your normal insulin dose the morning of the procedure and notify the staff that you have done so. . Preventing infections: Shower with an antibacterial soap the morning of your procedure.  . Build-up your immune system: Take 1000 mg of Vitamin C with every meal (3 times a day) the day prior to your procedure. [place] [facility] Antibiotics: Inform the staff if you have a condition or reason that requires you to take antibiotics before dental procedures. . Pregnancy: If you are pregnant, call and cancel the procedure. . Sickness: If you have a cold, fever, or any active infections, call and cancel the procedure. . Arrival: You must be in the  facility at least 30 minutes prior to your scheduled procedure. . Children: Do not bring any children with you. . Dress appropriately: Bring dark clothing that you would not mind if they get stained. . Valuables: Do not bring any jewelry or valuables. Procedure appointments are reserved for interventional treatments only. [place] [facility] No Prescription Refills. . No medication changes will be discussed during procedure appointments. . No disability issues will be discussed. ____________________________________________________________________________________________

## 2017-02-26 ENCOUNTER — Telehealth: Payer: Self-pay

## 2017-02-26 NOTE — Telephone Encounter (Signed)
Post procedure phone call.  LM 

## 2017-03-25 ENCOUNTER — Ambulatory Visit: Payer: Medicare Other | Attending: Pain Medicine | Admitting: Pain Medicine

## 2017-03-25 ENCOUNTER — Encounter: Payer: Self-pay | Admitting: Pain Medicine

## 2017-03-25 VITALS — BP 139/73 | HR 61 | Temp 97.7°F | Resp 16 | Ht 69.0 in | Wt 220.0 lb

## 2017-03-25 DIAGNOSIS — M170 Bilateral primary osteoarthritis of knee: Secondary | ICD-10-CM | POA: Diagnosis not present

## 2017-03-25 DIAGNOSIS — M25562 Pain in left knee: Secondary | ICD-10-CM | POA: Diagnosis not present

## 2017-03-25 DIAGNOSIS — M792 Neuralgia and neuritis, unspecified: Secondary | ICD-10-CM

## 2017-03-25 DIAGNOSIS — G894 Chronic pain syndrome: Secondary | ICD-10-CM

## 2017-03-25 DIAGNOSIS — M25561 Pain in right knee: Secondary | ICD-10-CM | POA: Diagnosis not present

## 2017-03-25 DIAGNOSIS — M5441 Lumbago with sciatica, right side: Secondary | ICD-10-CM

## 2017-03-25 DIAGNOSIS — G8929 Other chronic pain: Secondary | ICD-10-CM | POA: Diagnosis not present

## 2017-03-25 DIAGNOSIS — M47816 Spondylosis without myelopathy or radiculopathy, lumbar region: Secondary | ICD-10-CM

## 2017-03-25 DIAGNOSIS — M5442 Lumbago with sciatica, left side: Secondary | ICD-10-CM

## 2017-03-25 MED ORDER — SODIUM HYALURONATE (VISCOSUP) 20 MG/2ML IX SOSY
2.0000 mL | PREFILLED_SYRINGE | Freq: Once | INTRA_ARTICULAR | Status: AC
  Administered 2017-03-25: 10:00:00 via INTRA_ARTICULAR
  Filled 2017-03-25: qty 2

## 2017-03-25 MED ORDER — TRAMADOL HCL 50 MG PO TABS: 50.0000 mg | ORAL_TABLET | Freq: Every day | ORAL | 0 refills | Status: DC | PRN

## 2017-03-25 MED ORDER — LIDOCAINE HCL (PF) 1 % IJ SOLN
5.0000 mL | Freq: Once | INTRAMUSCULAR | Status: AC
  Administered 2017-03-25: 5 mL
  Filled 2017-03-25: qty 5

## 2017-03-25 MED ORDER — ROPIVACAINE HCL 2 MG/ML IJ SOLN
2.0000 mL | Freq: Once | INTRAMUSCULAR | Status: AC
  Administered 2017-03-25: 10 mL via INTRA_ARTICULAR
  Filled 2017-03-25: qty 10

## 2017-03-25 MED ORDER — LIDOCAINE HCL (PF) 1 % IJ SOLN
INTRAMUSCULAR | Status: AC
  Filled 2017-03-25: qty 10

## 2017-03-25 MED ORDER — ROPIVACAINE HCL 2 MG/ML IJ SOLN
INTRAMUSCULAR | Status: AC
Start: 2017-03-25 — End: 2017-03-25
  Filled 2017-03-25: qty 10

## 2017-03-25 MED ORDER — GABAPENTIN 300 MG PO CAPS: 300.0000 mg | ORAL_CAPSULE | Freq: Four times a day (QID) | ORAL | 5 refills | Status: DC

## 2017-03-25 NOTE — Progress Notes (Signed)
Patient's Name: Joshua Humphrey  MRN: [id number]  Referring Provider: Kirk Ruths, MD  DOB: 02/21/1930  PCP: Kirk Ruths, MD  DOS: 03/25/2017  Note by: Gaspar Cola, MD  Service setting: Ambulatory outpatient  Specialty: Interventional Pain Management  Patient type: Established  Location: [facility] (AMB) Pain Management Facility  Visit type: Interventional Procedure   Primary Reason for Visit: Interventional Pain Management Treatment. CC: Knee Pain (bilateral)  Procedure:  Anesthesia, Analgesia, Anxiolysis:  Type: Therapeutic Intra-Articular Hyalgan Knee Injection #4  Region: Lateral infrapatellar Knee Region Level: Knee Joint Laterality: Bilateral  Type: Local Anesthesia Local Anesthetic: Lidocaine 1% Route: Infiltration ([place]/IM) IV Access: Declined Sedation: Declined  Indication(s): Analgesia           Indications: 1. Osteoarthritis of knee (Bilateral)   2. Chronic knee pain (Bilateral) (L>R)   3. Chronic low back pain (Primary Area of Pain) (Bilateral) (R>L)   4. Lumbar facet syndrome (Bilateral)   5. Neurogenic pain   6. Chronic pain syndrome    Pain Score: Pre-procedure: 5 /10 Post-procedure: 2 /10  Pre-op Assessment:  Joshua Humphrey is a 84 y.o. (year old), male patient, seen today for interventional treatment. He  has a past surgical history that includes tumor removed (2000); Prostate surgery (2002); lipoma removal (2000); Tonsillectomy; Cataract extraction, bilateral; Toe Surgery; Olecranon bursectomy (Left, 01/21/2016); Elbow surgery; and Eye surgery. Joshua Humphrey has a current medication list which includes the following prescription(s): acetaminophen, aspirin ec, atorvastatin, b-d ultrafine iii short pen, calcium carbonate-vitamin d, carvedilol, cetirizine, ferrous fumarate, flaxseed (linseed), fluticasone, furosemide, glucosamine-chondroitin, hydralazine, ipratropium-albuterol, levemir flextouch, losartan, magnesium oxide, metformin, mometasone-formoterol,  montelukast, ocuvite adult 50+, nifedipine, omeprazole, potassium chloride sa, pramipexole, sitagliptin, tramadol, and gabapentin. His primarily concern today is the Knee Pain (bilateral)  Initial Vital Signs: There were no vitals taken for this visit. BMI: Estimated body mass index is 32.49 kg/m as calculated from the following:   Height as of this encounter: 5\' 9"  (1.753 m).   Weight as of this encounter: 220 lb (99.8 kg).  Risk Assessment: Allergies: Reviewed. He has No Known Allergies.  Allergy Precautions: None required Coagulopathies: Reviewed. None identified.  Blood-thinner therapy: None at this time Active Infection(s): Reviewed. None identified. Joshua Humphrey is afebrile  Site Confirmation: Joshua Humphrey was asked to confirm the procedure and laterality before marking the site Procedure checklist: Completed Consent: Before the procedure and under the influence of no sedative(s), amnesic(s), or anxiolytics, the patient was informed of the treatment options, risks and possible complications. To fulfill our ethical and legal obligations, as recommended by the American Medical Association's Code of Ethics, I have informed the patient of my clinical impression; the nature and purpose of the treatment or procedure; the risks, benefits, and possible complications of the intervention; the alternatives, including doing nothing; the risk(s) and benefit(s) of the alternative treatment(s) or procedure(s); and the risk(s) and benefit(s) of doing nothing. The patient was provided information about the general risks and possible complications associated with the procedure. These may include, but are not limited to: failure to achieve desired goals, infection, bleeding, organ or nerve damage, allergic reactions, paralysis, and death. In addition, the patient was informed of those risks and complications associated to the procedure, such as failure to decrease pain; infection; bleeding; organ or nerve damage  with subsequent damage to sensory, motor, and/or autonomic systems, resulting in permanent pain, numbness, and/or weakness of one or several areas of the body; allergic reactions; (i.e.: anaphylactic reaction); and/or death. Furthermore, the patient was  informed of those risks and complications associated with the medications. These include, but are not limited to: allergic reactions (i.e.: anaphylactic or anaphylactoid reaction(s)); adrenal axis suppression; blood sugar elevation that in diabetics may result in ketoacidosis or comma; water retention that in patients with history of congestive heart failure may result in shortness of breath, pulmonary edema, and decompensation with resultant heart failure; weight gain; swelling or edema; medication-induced neural toxicity; particulate matter embolism and blood vessel occlusion with resultant organ, and/or nervous system infarction; and/or aseptic necrosis of one or more joints. Finally, the patient was informed that Medicine is not an exact science; therefore, there is also the possibility of unforeseen or unpredictable risks and/or possible complications that may result in a catastrophic outcome. The patient indicated having understood very clearly. We have given the patient no guarantees and we have made no promises. Enough time was given to the patient to ask questions, all of which were answered to the patient's satisfaction. Joshua Humphrey has indicated that he wanted to continue with the procedure. Attestation: I, the ordering provider, attest that I have discussed with the patient the benefits, risks, side-effects, alternatives, likelihood of achieving goals, and potential problems during recovery for the procedure that I have provided informed consent. Date: 03/25/2017; Time: 6:54 AM  Pre-Procedure Preparation:  Monitoring: As per clinic protocol. Respiration, ETCO2, SpO2, BP, heart rate and rhythm monitor placed and checked for adequate function Safety  Precautions: Patient was assessed for positional comfort and pressure points before starting the procedure. Time-out: I initiated and conducted the "Time-out" before starting the procedure, as per protocol. The patient was asked to participate by confirming the accuracy of the "Time Out" information. Verification of the correct person, site, and procedure were performed and confirmed by me, the nursing staff, and the patient. "Time-out" conducted as per Joint Commission's Universal Protocol (UP.01.01.01). "Time-out" Date & Time: 03/25/2017; 0949 hrs.  Description of Procedure Process:   Position: Sitting Target Area: Knee Joint Approach: Just above the Lateral tibial plateau, lateral to the infrapatellar tendon. Area Prepped: Entire knee area, from the mid-thigh to the mid-shin. Prepping solution: ChloraPrep (2% chlorhexidine gluconate and 70% isopropyl alcohol) Safety Precautions: Aspiration looking for blood return was conducted prior to all injections. At no point did we inject any substances, as a needle was being advanced. No attempts were made at seeking any paresthesias. Safe injection practices and needle disposal techniques used. Medications properly checked for expiration dates. SDV (single dose vial) medications used. Description of the Procedure: Protocol guidelines were followed. The patient was placed in position over the fluoroscopy table. The target area was identified and the area prepped in the usual manner. Skin desensitized using vapocoolant spray. Skin & deeper tissues infiltrated with local anesthetic. Appropriate amount of time allowed to pass for local anesthetics to take effect. The procedure needles were then advanced to the target area. Proper needle placement secured. Negative aspiration confirmed. Solution injected in intermittent fashion, asking for systemic symptoms every 0.5cc of injectate. The needles were then removed and the area cleansed, making sure to leave some of the  prepping solution back to take advantage of its long term bactericidal properties. Vitals:   03/25/17 0918 03/25/17 0953  BP: (!) 146/70 139/73  Pulse: 64 61  Resp: 16 16  Temp: 97.7 F (36.5 C)   TempSrc: Oral   SpO2: 95% 94%  Weight: 220 lb (99.8 kg)   Height: 5\' 9"  (1.753 m)     Start Time: 0949 hrs. End Time: [phone]  hrs. Materials:  Needle(s) Type: Regular needle Gauge: 22G Length: 3.5-in Medication(s): We administered lidocaine (PF), ropivacaine (PF) 2 mg/mL (0.2%), Sodium Hyaluronate, and Sodium Hyaluronate. Please see chart orders for dosing details.  Imaging Guidance:  Type of Imaging Technique: None used Indication(s): N/A Exposure Time: No patient exposure Contrast: None used. Fluoroscopic Guidance: N/A Ultrasound Guidance: N/A Interpretation: N/A  Antibiotic Prophylaxis:  Indication(s): None identified Antibiotic given: None  Post-operative Assessment:  EBL: None Complications: No immediate post-treatment complications observed by team, or reported by patient. Note: The patient tolerated the entire procedure well. A repeat set of vitals were taken after the procedure and the patient was kept under observation following institutional policy, for this type of procedure. Post-procedural neurological assessment was performed, showing return to baseline, prior to discharge. The patient was provided with post-procedure discharge instructions, including a section on how to identify potential problems. Should any problems arise concerning this procedure, the patient was given instructions to immediately contact us, at any time, without hesitation. In any case, we plan to contact the patient by telephone for a follow-up status report regarding this interventional procedure. Comments:  No additional relevant information.  Plan of Care   Imaging Orders  No imaging studies ordered today    Procedure Orders     KNEE INJECTION     KNEE INJECTION     LUMBAR FACET(MEDIAL  BRANCH NERVE BLOCK) MBNB  Medications ordered for procedure: Meds ordered this encounter  Medications  . lidocaine (PF) (XYLOCAINE) 1 % injection 5 mL  . ropivacaine (PF) 2 mg/mL (0.2%) (NAROPIN) injection 2 mL  . Sodium Hyaluronate SOSY 2 mL  . Sodium Hyaluronate SOSY 2 mL  . gabapentin (NEURONTIN) 300 MG capsule    Sig: Take 1-3 capsules (300-900 mg total) by mouth 4 (four) times daily. Follow titration schedule.    Dispense:  360 capsule    Refill:  5    Do not place this medication, or any other prescription from our practice, on "Automatic Refill". Patient may have prescription filled one day early if pharmacy is closed on scheduled refill date.  . traMADol (ULTRAM) 50 MG tablet    Sig: Take 1 tablet (50 mg total) by mouth daily as needed for severe pain.    Dispense:  180 tablet    Refill:  0    Do not place this medication, or any other prescription from our practice, on "Automatic Refill". Patient may have prescription filled one day early if pharmacy is closed on scheduled refill date. Do not fill until: 04/12/17 To last until: 10/09/17   Medications administered: We administered lidocaine (PF), ropivacaine (PF) 2 mg/mL (0.2%), Sodium Hyaluronate, and Sodium Hyaluronate.  See the medical record for exact dosing, route, and time of administration.  New Prescriptions   No medications on file   Disposition: Discharge home  Discharge Date & Time: 03/25/2017; 0956 hrs.   Physician-requested Follow-up: Return in about 2 weeks (around 04/08/2017) for Procedure (no sedation): (B) [place] Hyalgan #5.  Future Appointments  Date Time Provider [place]  04/06/2017  2:45 PM [facility] None  04/08/2017  9:00 AM Milinda Pointer, MD [facility] None  04/08/2017  1:45 PM [facility] None   Primary Care Physician: Kirk Ruths, MD Location: [facility] Out[facility] Facility Note by: Gaspar Cola, MD Date: 03/25/2017; Time: 10:46  AM  Disclaimer:  Medicine is not an exact science. The only guarantee in medicine is that nothing is guaranteed. It is important to note  that the decision to proceed with this intervention was based on the information collected from the patient. The Data and conclusions were drawn from the patient's questionnaire, the interview, and the physical examination. Because the information was provided in large part by the patient, it cannot be guaranteed that it has not been purposely or unconsciously manipulated. Every effort has been made to obtain as much relevant data as possible for this evaluation. It is important to note that the conclusions that lead to this procedure are derived in large part from the available data. Always take into account that the treatment will also be dependent on availability of resources and existing treatment guidelines, considered by other Pain Management Practitioners as being common knowledge and practice, at the time of the intervention. For Medico-Legal purposes, it is also important to point out that variation in procedural techniques and pharmacological choices are the acceptable norm. The indications, contraindications, technique, and results of the above procedure should only be interpreted and judged by a Board-Certified Interventional Pain Specialist with extensive familiarity and expertise in the same exact procedure and technique.

## 2017-03-25 NOTE — Patient Instructions (Addendum)
____________________________________________________________________________________________  Post-Procedure instructions Instructions:  Apply ice: Fill a plastic sandwich bag with crushed ice. Cover it with a small towel and apply to injection site. Apply for 15 minutes then remove x 15 minutes. Repeat sequence on day of procedure, until you go to bed. The purpose is to minimize swelling and discomfort after procedure.  Apply heat: Apply heat to procedure site starting the day following the procedure. The purpose is to treat any soreness and discomfort from the procedure.  Food intake: Start with clear liquids (like water) and advance to regular food, as tolerated.   Physical activities: Keep activities to a minimum for the first 8 hours after the procedure.   Driving: If you have received any sedation, you are not allowed to drive for 24 hours after your procedure.  Blood thinner: Restart your blood thinner 6 hours after your procedure. (Only for those taking blood thinners)  Insulin: As soon as you can eat, you may resume your normal dosing schedule. (Only for those taking insulin)  Infection prevention: Keep procedure site clean and dry.  Post-procedure Pain Diary: Extremely important that this be done correctly and accurately. Recorded information will be used to determine the next step in treatment.  Pain evaluated is that of treated area only. Do not include pain from an untreated area.  Complete every hour, on the hour, for the initial 8 hours. Set an alarm to help you do this part accurately.  Do not go to sleep and have it completed later. It will not be accurate.  Follow-up appointment: Keep your follow-up appointment after the procedure. Usually 2 weeks for most procedures. (6 weeks in the case of radiofrequency.) Bring you pain diary.  Expect:  From numbing medicine (AKA: Local Anesthetics): Numbness or decrease in pain.  Onset: Full effect within 15 minutes of  injected.  Duration: It will depend on the type of local anesthetic used. On the average, 1 to 8 hours.   From steroids: Decrease in swelling or inflammation. Once inflammation is improved, relief of the pain will follow.  Onset of benefits: Depends on the amount of swelling present. The more swelling, the longer it will take for the benefits to be seen. In some cases, up to 10 days.  Duration: Steroids will stay in the system x 2 weeks. Duration of benefits will depend on multiple posibilities including persistent irritating factors.  From procedure: Some discomfort is to be expected once the numbing medicine wears off. This should be minimal if ice and heat are applied as instructed. Call if:  You experience numbness and weakness that gets worse with time, as opposed to wearing off.  New onset bowel or bladder incontinence. (Spinal procedures only)  Emergency Numbers:  Durning business hours (Monday - Thursday, 8:00 AM - 4:00 PM) (Friday, 9:00 AM - 12:00 Noon): [phone]  After hours: [phone] ____________________________________________________________________________________________   ____________________________________________________________________________________________  Preparing for your procedure (without sedation) Instructions: . Oral Intake: Do not eat or drink anything for at least 3 hours prior to your procedure. . Transportation: Unless otherwise stated by your physician, you may drive yourself after the procedure. . Blood Pressure Medicine: Take your blood pressure medicine with a sip of water the morning of the procedure. . Blood thinners:  . Diabetics on insulin: Notify the staff so that you can be scheduled 1st case in the morning. If your diabetes requires high dose insulin, take only  of your normal insulin dose the morning of the procedure and notify the  staff that you have done so. . Preventing infections: Shower with an antibacterial soap the  morning of your procedure.  . Build-up your immune system: Take 1000 mg of Vitamin C with every meal (3 times a day) the day prior to your procedure. [facility]land Kitchen Antibiotics: Inform the staff if you have a condition or reason that requires you to take antibiotics before dental procedures. . Pregnancy: If you are pregnant, call and cancel the procedure. . Sickness: If you have a cold, fever, or any active infections, call and cancel the procedure. . Arrival: You must be in the facility at least 30 minutes prior to your scheduled procedure. . Children: Do not bring any children with you. . Dress appropriately: Bring dark clothing that you would not mind if they get stained. . Valuables: Do not bring any jewelry or valuables. Procedure appointments are reserved for interventional treatments only. Marland Kitchen No Prescription Refills. . No medication changes will be discussed during procedure appointments. . No disability issues will be discussed. ____________________________________________________________________________________________   Facet Blocks Patient Information  Description: The facets are joints in the spine between the vertebrae.  Like any joints in the body, facets can become irritated and painful.  Arthritis can also effect the facets.  By injecting steroids and local anesthetic in and around these joints, we can temporarily block the nerve supply to them.  Steroids act directly on irritated nerves and tissues to reduce selling and inflammation which often leads to decreased pain.  Facet blocks may be done anywhere along the spine from the neck to the low back depending upon the location of your pain.   After numbing the skin with local anesthetic (like Novocaine), a small needle is passed onto the facet joints under x-ray guidance.  You may experience a sensation of pressure while this is being done.  The entire block usually lasts about 15-25 minutes.   Conditions which may be treated by facet  blocks:   Low back/buttock pain  Neck/shoulder pain  Certain types of headaches  Preparation for the injection:  1. Do not eat any solid food or dairy products within 8 hours of your appointment. 2. You may drink clear liquid up to 3 hours before appointment.  Clear liquids include water, black coffee, juice or soda.  No milk or cream please. 3. You may take your regular medication, including pain medications, with a sip of water before your appointment.  Diabetics should hold regular insulin (if taken separately) and take 1/2 normal NPH dose the morning of the procedure.  Carry some sugar containing items with you to your appointment. 4. A driver must accompany you and be prepared to drive you home after your procedure. 5. Bring all your current medications with you. 6. An IV may be inserted and sedation may be given at the discretion of the physician. 7. A blood pressure cuff, EKG and other monitors will often be applied during the procedure.  Some patients may need to have extra oxygen administered for a short period. 8. You will be asked to provide medical information, including your allergies and medications, prior to the procedure.  We must know immediately if you are taking blood thinners (like Coumadin/Warfarin) or if you are allergic to IV iodine contrast (dye).  We must know if you could possible be pregnant.  Possible side-effects:   Bleeding from needle site  Infection (rare, may require surgery)  Nerve injury (rare)  Numbness & tingling (temporary)  Difficulty urinating (rare, temporary)  Spinal headache (a  headache worse with upright posture)  Light-headedness (temporary)  Pain at injection site (serveral days)  Decreased blood pressure (rare, temporary)  Weakness in arm/leg (temporary)  Pressure sensation in back/neck (temporary)   Call if you experience:   Fever/chills associated with headache or increased back/neck pain  Headache worsened by an  upright position  New onset, weakness or numbness of an extremity below the injection site  Hives or difficulty breathing (go to the emergency room)  Inflammation or drainage at the injection site(s)  Severe back/neck pain greater than usual  New symptoms which are concerning to you  Please note:  Although the local anesthetic injected can often make your back or neck feel good for several hours after the injection, the pain will likely return. It takes 3-7 days for steroids to work.  You may not notice any pain relief for at least one week.  If effective, we will often do a series of 2-3 injections spaced 3-6 weeks apart to maximally decrease your pain.  After the initial series, you may be a candidate for a more permanent nerve block of the facets.  If you have any questions, please call #[phone]) [facility]  ____________________________________________________________________________________________  Risk(s) and Possible Complications  Patient Responsibilities: It is important that you read this as it is part of your informed consent. It is our duty to inform you of the risks and possible complications associated with treatments offered to you. It is your responsibility as a patient to read this and to ask questions about anything that is not clear or that you believe was not covered in this document.  Patient's Rights: You have the right to refuse treatment. You also have the right to change your mind, even after initially having agreed to have the treatment done. However, under this last option, if you wait until the last second to change your mind, you may be charged for the materials used up to that point.  Introduction: Medicine is not an Chief Strategy Officer. Everything in Medicine, including the lack of treatment(s), carries the potential for danger, harm, or loss (which is by definition: Risk). In Medicine, a complication is a secondary problem,  condition, or disease that can aggravate an already existing one. All treatments carry the risk of possible complications. The fact that a side effects or complications occurs, does not imply that the treatment was conducted incorrectly. It must be clearly understood that these can happen even when everything is done following the highest safety standards.  No treatment: You can choose not to proceed with the proposed treatment alternative. The "PRO(s)" would include: avoiding the risk of complications associated with the therapy. The "CON(s)" would include: not getting any of the treatment benefits. These benefits fall under one of three categories: diagnostic; therapeutic; and/or palliative. Diagnostic benefits include: getting information which can ultimately lead to improvement of the disease or symptom(s). Therapeutic benefits are those associated with the successful treatment of the disease. Finally, palliative benefits are those related to the decrease of the primary symptoms, without necessarily curing the condition (example: decreasing the pain from a flare-up of a chronic condition, such as incurable terminal cancer).  General Risks and Complications: These are associated to most interventional treatments. They can occur alone, or in combination. They fall under one of the following six (6) categories: no benefit or worsening of symptoms; bleeding; infection; nerve damage; allergic reactions; and/or death. 1. No benefits or worsening of symptoms: In Medicine there are no guarantees,  only probabilities. No healthcare provider can ever guarantee that a medical treatment will work, they can only state the probability that it may. Furthermore, there is always the possibility that the condition may worsen, either directly, or indirectly, as a consequence of the treatment. 2. Bleeding: This is more common if the patient is taking a blood thinner, either prescription or over the counter (example: Goody  Powders, Fish oil, Aspirin, Garlic, etc.), or if suffering a condition associated with impaired coagulation (example: Hemophilia, cirrhosis of the liver, low platelet counts, etc.). However, even if you do not have one on these, it can still happen. If you have any of these conditions, or take one of these drugs, make sure to notify your treating physician. 3. Infection: This is more common in patients with a compromised immune system, either due to disease (example: diabetes, cancer, human immunodeficiency virus [HIV], etc.), or due to medications or treatments (example: therapies used to treat cancer and rheumatological diseases). However, even if you do not have one on these, it can still happen. If you have any of these conditions, or take one of these drugs, make sure to notify your treating physician. 4. Nerve Damage: This is more common when the treatment is an invasive one, but it can also happen with the use of medications, such as those used in the treatment of cancer. The damage can occur to small secondary nerves, or to large primary ones, such as those in the spinal cord and brain. This damage may be temporary or permanent and it may lead to impairments that can range from temporary numbness to permanent paralysis and/or brain death. 5. Allergic Reactions: Any time a substance or material comes in contact with our body, there is the possibility of an allergic reaction. These can range from a mild skin rash (contact dermatitis) to a severe systemic reaction (anaphylactic reaction), which can result in death. 6. Death: In general, any medical intervention can result in death, most of the time due to an unforeseen complication. ____________________________________________________________________________________________

## 2017-03-25 NOTE — Progress Notes (Signed)
Safety precautions to be maintained throughout the outpatient stay will include: orient to surroundings, keep bed in low position, maintain call bell within reach at all times, provide assistance with transfer out of bed and ambulation.  

## 2017-03-26 ENCOUNTER — Telehealth: Payer: Self-pay | Admitting: *Deleted

## 2017-03-26 NOTE — Telephone Encounter (Signed)
Voicemail left to please call office if there are any questions or concerns re; knee injections on yesterday.

## 2017-04-06 ENCOUNTER — Other Ambulatory Visit: Payer: Medicare Other

## 2017-04-06 DIAGNOSIS — C61 Malignant neoplasm of prostate: Secondary | ICD-10-CM

## 2017-04-07 LAB — PSA: Prostate Specific Ag, Serum: <0.1 ng/mL (ref 0.0–4.0)

## 2017-04-08 ENCOUNTER — Ambulatory Visit
Admission: RE | Admit: 2017-04-08 | Discharge: 2017-04-08 | Disposition: A | Discharge status: Home / Self Care | Payer: Medicare Other | Source: Ambulatory Visit | Attending: Pain Medicine | Admitting: Pain Medicine

## 2017-04-08 ENCOUNTER — Other Ambulatory Visit: Payer: Self-pay

## 2017-04-08 ENCOUNTER — Encounter: Payer: Self-pay | Admitting: Pain Medicine

## 2017-04-08 ENCOUNTER — Encounter: Payer: Self-pay | Admitting: Urology

## 2017-04-08 ENCOUNTER — Ambulatory Visit (HOSPITAL_BASED_OUTPATIENT_CLINIC_OR_DEPARTMENT_OTHER): Payer: Medicare Other | Admitting: Pain Medicine

## 2017-04-08 ENCOUNTER — Ambulatory Visit: Payer: Medicare Other | Admitting: Urology

## 2017-04-08 VITALS — BP 162/84 | HR 60 | Temp 97.8°F | Resp 12 | Ht 69.0 in | Wt 230.0 lb

## 2017-04-08 DIAGNOSIS — M5441 Lumbago with sciatica, right side: Secondary | ICD-10-CM

## 2017-04-08 DIAGNOSIS — G8929 Other chronic pain: Secondary | ICD-10-CM

## 2017-04-08 DIAGNOSIS — M25562 Pain in left knee: Secondary | ICD-10-CM

## 2017-04-08 DIAGNOSIS — M25561 Pain in right knee: Secondary | ICD-10-CM

## 2017-04-08 DIAGNOSIS — M170 Bilateral primary osteoarthritis of knee: Secondary | ICD-10-CM

## 2017-04-08 DIAGNOSIS — M5442 Lumbago with sciatica, left side: Secondary | ICD-10-CM | POA: Insufficient documentation

## 2017-04-08 DIAGNOSIS — M47816 Spondylosis without myelopathy or radiculopathy, lumbar region: Secondary | ICD-10-CM

## 2017-04-08 MED ORDER — SODIUM HYALURONATE (VISCOSUP) 20 MG/2ML IX SOSY
2.0000 mL | PREFILLED_SYRINGE | Freq: Once | INTRA_ARTICULAR | Status: AC
  Administered 2017-04-08: 2 mL via INTRA_ARTICULAR
  Filled 2017-04-08: qty 2

## 2017-04-08 MED ORDER — TRIAMCINOLONE ACETONIDE 40 MG/ML IJ SUSP
40.0000 mg | Freq: Once | INTRAMUSCULAR | Status: AC
  Administered 2017-04-08: 40 mg
  Filled 2017-04-08: qty 1

## 2017-04-08 MED ORDER — ROPIVACAINE HCL 2 MG/ML IJ SOLN
2.0000 mL | Freq: Once | INTRAMUSCULAR | Status: AC
  Administered 2017-04-08: 2 mL via INTRA_ARTICULAR

## 2017-04-08 MED ORDER — ROPIVACAINE HCL 2 MG/ML IJ SOLN
INTRAMUSCULAR | Status: AC
  Filled 2017-04-08: qty 10

## 2017-04-08 MED ORDER — LIDOCAINE HCL (PF) 1 % IJ SOLN
5.0000 mL | Freq: Once | INTRAMUSCULAR | Status: AC
  Administered 2017-04-08: 5 mL

## 2017-04-08 MED ORDER — LIDOCAINE HCL 2 % IJ SOLN
10.0000 mL | Freq: Once | INTRAMUSCULAR | Status: AC
  Administered 2017-04-08: 200 mg
  Filled 2017-04-08: qty 40

## 2017-04-08 MED ORDER — LIDOCAINE HCL (PF) 1 % IJ SOLN
INTRAMUSCULAR | Status: AC
  Filled 2017-04-08: qty 5

## 2017-04-08 MED ORDER — ROPIVACAINE HCL 2 MG/ML IJ SOLN
9.0000 mL | Freq: Once | INTRAMUSCULAR | Status: AC
  Administered 2017-04-08: 9 mL via PERINEURAL
  Filled 2017-04-08: qty 10

## 2017-04-08 NOTE — Progress Notes (Signed)
Safety precautions to be maintained throughout the outpatient stay will include: orient to surroundings, keep bed in low position, maintain call bell within reach at all times, provide assistance with transfer out of bed and ambulation.  

## 2017-04-08 NOTE — Progress Notes (Signed)
Patient's Name: Joshua Humphrey  MRN: [id number]  Referring Provider: Kirk Ruths, MD  DOB: Oct 30, 1929  PCP: Kirk Ruths, MD  DOS: 04/08/2017  Note by: Gaspar Cola, MD  Service setting: Ambulatory outpatient  Specialty: Interventional Pain Management  Patient type: Established  Location: [facility] (AMB) Pain Management Facility  Visit type: Interventional Procedure   Primary Reason for Visit: Interventional Pain Management Treatment. CC: Knee Pain (right)  Procedure #1:  Anesthesia, Analgesia, Anxiolysis:  Type: Therapeutic Intra-Articular Hyalgan Knee Injection #5  Region: Lateral infrapatellar Knee Region Level: Knee Joint Laterality: Bilateral  Type: Local Anesthesia Local Anesthetic: Lidocaine 1% Route: Infiltration ([place]/IM) IV Access: Declined Sedation: Declined  Indication(s): Analgesia           Indications: 1. Osteoarthritis of knee (Bilateral)   2. Chronic knee pain (Bilateral) (L>R)     Procedure #2:  Anesthesia, Analgesia, Anxiolysis:  Type: Diagnostic Medial Branch Facet Block  #2  Region: Lumbar Level: L2, L3, L4, L5, & S1 Medial Branch Level(s) Laterality: Bilateral  Type: Local Anesthesia Local Anesthetic: Lidocaine 1% Route: Infiltration ([place]/IM) IV Access: Declined Sedation: Declined  Indication(s): Analgesia           Indications: 1. Lumbar facet syndrome (Bilateral)   2. Chronic low back pain (Primary Area of Pain) (Bilateral) (R>L)   3. Lumbar facet joint osteoarthritis (Bilateral)   4. Osteoarthritis of lumbar spine    Pain Score: Pre-procedure: 4 /10 Post-procedure: 0-No pain/10  Pre-op Assessment:  Joshua Humphrey is a 84 y.o. (year old), male patient, seen today for interventional treatment. He  has a past surgical history that includes tumor removed (2000); Prostate surgery (2002); lipoma removal (2000); Tonsillectomy; Cataract extraction, bilateral; Toe Surgery; Olecranon bursectomy (Left, 01/21/2016); Elbow surgery; and Eye surgery.  Joshua Humphrey has a current medication list which includes the following prescription(s): acetaminophen, aspirin ec, atorvastatin, b-d ultrafine iii short pen, calcium carbonate-vitamin d, carvedilol, cetirizine, ferrous fumarate, flaxseed (linseed), fluticasone, furosemide, gabapentin, glucosamine-chondroitin, hydralazine, ipratropium-albuterol, levemir flextouch, losartan, magnesium oxide, metformin, mometasone-formoterol, montelukast, ocuvite adult 50+, nifedipine, omeprazole, potassium chloride sa, pramipexole, sitagliptin, and tramadol. His primarily concern today is the Knee Pain (right)  Initial Vital Signs:  Pulse Rate: 60 Temp: 97.8 F (36.6 C) Resp: 18 BP: (!) 159/75 SpO2: 94 %  BMI: Estimated body mass index is 33.97 kg/m as calculated from the following:   Height as of this encounter: 5\' 9"  (1.753 m).   Weight as of this encounter: 230 lb (104.3 kg).  Risk Assessment: Allergies: Reviewed. He has No Known Allergies.  Allergy Precautions: None required Coagulopathies: Reviewed. None identified.  Blood-thinner therapy: None at this time Active Infection(s): Reviewed. None identified. Joshua Humphrey is afebrile  Site Confirmation: Joshua Humphrey was asked to confirm the procedure and laterality before marking the site Procedure checklist: Completed Consent: Before the procedure and under the influence of no sedative(s), amnesic(s), or anxiolytics, the patient was informed of the treatment options, risks and possible complications. To fulfill our ethical and legal obligations, as recommended by the American Medical Association's Code of Ethics, I have informed the patient of my clinical impression; the nature and purpose of the treatment or procedure; the risks, benefits, and possible complications of the intervention; the alternatives, including doing nothing; the risk(s) and benefit(s) of the alternative treatment(s) or procedure(s); and the risk(s) and benefit(s) of doing nothing. The patient  was provided information about the general risks and possible complications associated with the procedure. These may include, but are not limited to: failure  to achieve desired goals, infection, bleeding, organ or nerve damage, allergic reactions, paralysis, and death. In addition, the patient was informed of those risks and complications associated to the procedure, such as failure to decrease pain; infection; bleeding; organ or nerve damage with subsequent damage to sensory, motor, and/or autonomic systems, resulting in permanent pain, numbness, and/or weakness of one or several areas of the body; allergic reactions; (i.e.: anaphylactic reaction); and/or death. Furthermore, the patient was informed of those risks and complications associated with the medications. These include, but are not limited to: allergic reactions (i.e.: anaphylactic or anaphylactoid reaction(s)); adrenal axis suppression; blood sugar elevation that in diabetics may result in ketoacidosis or comma; water retention that in patients with history of congestive heart failure may result in shortness of breath, pulmonary edema, and decompensation with resultant heart failure; weight gain; swelling or edema; medication-induced neural toxicity; particulate matter embolism and blood vessel occlusion with resultant organ, and/or nervous system infarction; and/or aseptic necrosis of one or more joints. Finally, the patient was informed that Medicine is not an exact science; therefore, there is also the possibility of unforeseen or unpredictable risks and/or possible complications that may result in a catastrophic outcome. The patient indicated having understood very clearly. We have given the patient no guarantees and we have made no promises. Enough time was given to the patient to ask questions, all of which were answered to the patient's satisfaction. Joshua Humphrey has indicated that he wanted to continue with the procedure. Attestation: I, the  ordering provider, attest that I have discussed with the patient the benefits, risks, side-effects, alternatives, likelihood of achieving goals, and potential problems during recovery for the procedure that I have provided informed consent. Date: 04/08/2017; Time: 6:53 AM  Pre-Procedure Preparation:  Monitoring: As per clinic protocol. Respiration, ETCO2, SpO2, BP, heart rate and rhythm monitor placed and checked for adequate function Safety Precautions: Patient was assessed for positional comfort and pressure points before starting the procedure. Time-out: I initiated and conducted the "Time-out" before starting the procedure, as per protocol. The patient was asked to participate by confirming the accuracy of the "Time Out" information. Verification of the correct person, site, and procedure were performed and confirmed by me, the nursing staff, and the patient. "Time-out" conducted as per Joint Commission's Universal Protocol (UP.01.01.01). "Time-out" Date & Time: 04/08/2017; 0958 hrs.  Description of Procedure #1 Process:   Position: Sitting Target Area: Knee Joint Approach: Just above the Lateral tibial plateau, lateral to the infrapatellar tendon. Area Prepped: Entire knee area, from the mid-thigh to the mid-shin. Prepping solution: ChloraPrep (2% chlorhexidine gluconate and 70% isopropyl alcohol) Safety Precautions: Aspiration looking for blood return was conducted prior to all injections. At no point did we inject any substances, as a needle was being advanced. No attempts were made at seeking any paresthesias. Safe injection practices and needle disposal techniques used. Medications properly checked for expiration dates. SDV (single dose vial) medications used. Description of the Procedure: Protocol guidelines were followed. The patient was placed in position over the fluoroscopy table. The target area was identified and the area prepped in the usual manner. Skin desensitized using vapocoolant  spray. Skin & deeper tissues infiltrated with local anesthetic. Appropriate amount of time allowed to pass for local anesthetics to take effect. The procedure needles were then advanced to the target area. Proper needle placement secured. Negative aspiration confirmed. Solution injected in intermittent fashion, asking for systemic symptoms every 0.5cc of injectate. The needles were then removed and the area cleansed,  making sure to leave some of the prepping solution back to take advantage of its long term bactericidal properties.  Start Time: 0937 hrs. End Time: 0940 hrs. Materials:  Needle(s) Type: Regular needle Gauge: 25G Length: 1.5-in Medication(s): Please see chart orders for dosing details.  Imaging Guidance for procedure #1:  Type of Imaging Technique: None used Indication(s): N/A Exposure Time: No patient exposure Contrast: None used. Fluoroscopic Guidance: N/A Ultrasound Guidance: N/A Interpretation: N/A  Description of Procedure #2 Process:   Position: Prone Target Area: For Lumbar Facet blocks, the target is the groove formed by the junction of the transverse process and superior articular process. For the L5 dorsal ramus, the target is the notch between superior articular process and sacral ala. For the S1 dorsal ramus, the target is the superior and lateral edge of the posterior S1 Sacral foramen. Approach: Paramedial approach. Area Prepped: Entire Posterior Lumbosacral Region Prepping solution: ChloraPrep (2% chlorhexidine gluconate and 70% isopropyl alcohol) Safety Precautions: Aspiration looking for blood return was conducted prior to all injections. At no point did we inject any substances, as a needle was being advanced. No attempts were made at seeking any paresthesias. Safe injection practices and needle disposal techniques used. Medications properly checked for expiration dates. SDV (single dose vial) medications used. Description of the Procedure: Protocol guidelines  were followed. The patient was placed in position over the fluoroscopy table. The target area was identified and the area prepped in the usual manner. Skin desensitized using vapocoolant spray. Skin & deeper tissues infiltrated with local anesthetic. Appropriate amount of time allowed to pass for local anesthetics to take effect. The procedure needle was introduced through the skin, ipsilateral to the reported pain, and advanced to the target area. Employing the "Medial Branch Technique", the needles were advanced to the angle made by the superior and medial portion of the transverse process, and the lateral and inferior portion of the superior articulating process of the targeted vertebral bodies. This area is known as "Burton's Eye" or the "Eye of the [place] Dog". A procedure needle was introduced through the skin, and this time advanced to the angle made by the superior and medial border of the sacral ala, and the lateral border of the S1 vertebral body. This last needle was later repositioned at the superior and lateral border of the posterior S1 foramen. Negative aspiration confirmed. Solution injected in intermittent fashion, asking for systemic symptoms every 0.5cc of injectate. The needles were then removed and the area cleansed, making sure to leave some of the prepping solution back to take advantage of its long term bactericidal properties.   Illustration of the posterior view of the lumbar spine and the posterior neural structures. Laminae of L2 through S1 are labeled. DPRL5, dorsal primary ramus of L5; DPRS1, dorsal primary ramus of S1; DPR3, dorsal primary ramus of L3; FJ, facet (zygapophyseal) joint L3-L4; I, inferior articular process of L4; LB1, lateral branch of dorsal primary ramus of L1; IAB, inferior articular branches from L3 medial branch (supplies L4-L5 facet joint); IBP, intermediate branch plexus; MB3, medial branch of dorsal primary ramus of L3; NR3, third lumbar nerve root; S,  superior articular process of L5; SAB, superior articular branches from L4 (supplies L4-5 facet joint also); TP3, transverse process of L3.  Vitals:   04/08/17 1000 04/08/17 1005 04/08/17 1010 04/08/17 1020  BP: (!) 176/87 (!) 170/84 (!) 156/85 (!) 162/84  Pulse:      Resp: 14 14 13 12   Temp:  SpO2: 94% 94% 95% 95%  Weight:      Height:        Start Time: 0958 hrs. End Time: 1012 hrs. Materials:  Needle(s) Type: Regular needle Gauge: 22G Length: 3.5-in Medication(s): Please see chart orders for dosing details.  Imaging Guidance for procedure #2 (Spinal):  Type of Imaging Technique: Fluoroscopy Guidance (Spinal) Indication(s): Assistance in needle guidance and placement for procedures requiring needle placement in or near specific anatomical locations not easily accessible without such assistance. Exposure Time: Please see nurses notes. Contrast: None used. Fluoroscopic Guidance: I was personally present during the use of fluoroscopy. "Tunnel Vision Technique" used to obtain the best possible view of the target area. Parallax error corrected before commencing the procedure. "Direction-depth-direction" technique used to introduce the needle under continuous pulsed fluoroscopy. Once target was reached, antero-posterior, oblique, and lateral fluoroscopic projection used confirm needle placement in all planes. Images permanently stored in EMR. Interpretation: No contrast injected. I personally interpreted the imaging intraoperatively. Adequate needle placement confirmed in multiple planes. Permanent images saved into the patient's record.  Antibiotic Prophylaxis:  Indication(s): None identified Antibiotic given: None  Post-operative Assessment:  Post-procedure Vital Signs:  Pulse Rate: 60 Temp: 97.8 F (36.6 C) ECG Heart Rate: (!) 59 Resp: 12 BP: (!) 162/84 SpO2: 95 %  EBL: None  Complications: No immediate post-treatment complications observed by team, or reported by  patient.  Note: The patient tolerated the entire procedure well. A repeat set of vitals were taken after the procedure and the patient was kept under observation following institutional policy, for this type of procedure. Post-procedural neurological assessment was performed, showing return to baseline, prior to discharge. The patient was provided with post-procedure discharge instructions, including a section on how to identify potential problems. Should any problems arise concerning this procedure, the patient was given instructions to immediately contact us, at any time, without hesitation. In any case, we plan to contact the patient by telephone for a follow-up status report regarding this interventional procedure.  Comments:  No additional relevant information.  Plan of Care   Imaging Orders     DG C-Arm 1-60 Min-No Report  Procedure Orders     KNEE INJECTION     LUMBAR FACET(MEDIAL BRANCH NERVE BLOCK) MBNB  Medications ordered for procedure: Meds ordered this encounter  Medications  . lidocaine (PF) (XYLOCAINE) 1 % injection 5 mL  . ropivacaine (PF) 2 mg/mL (0.2%) (NAROPIN) injection 2 mL  . Sodium Hyaluronate SOSY 2 mL  . Sodium Hyaluronate SOSY 2 mL  . lidocaine (XYLOCAINE) 2 % (with pres) injection 200 mg  . ropivacaine (PF) 2 mg/mL (0.2%) (NAROPIN) injection 9 mL  . triamcinolone acetonide (KENALOG-40) injection 40 mg  . ropivacaine (PF) 2 mg/mL (0.2%) (NAROPIN) injection 9 mL  . triamcinolone acetonide (KENALOG-40) injection 40 mg   Medications administered: We administered lidocaine (PF), ropivacaine (PF) 2 mg/mL (0.2%), Sodium Hyaluronate, Sodium Hyaluronate, lidocaine, ropivacaine (PF) 2 mg/mL (0.2%), triamcinolone acetonide, ropivacaine (PF) 2 mg/mL (0.2%), and triamcinolone acetonide.  See the medical record for exact dosing, route, and time of administration.  New Prescriptions   No medications on file   Disposition: Discharge home  Discharge Date & Time:  04/08/2017; 1021 hrs.   Physician-requested Follow-up: Return for post-procedure eval (2 wks), w/ Dr. Dossie Arbour.  Future Appointments  Date Time Provider [place]  04/08/2017  1:45 PM Nickie Retort, MD [facility] None  04/26/2017  1:15 PM Milinda Pointer, [place] None   Primary Care Physician: Frazier Richards  Viona Gilmore, MD Location: [facility] Note by: Gaspar Cola, MD Date: 04/08/2017; Time: 10:31 AM  Disclaimer:  Medicine is not an exact science. The only guarantee in medicine is that nothing is guaranteed. It is important to note that the decision to proceed with this intervention was based on the information collected from the patient. The Data and conclusions were drawn from the patient's questionnaire, the interview, and the physical examination. Because the information was provided in large part by the patient, it cannot be guaranteed that it has not been purposely or unconsciously manipulated. Every effort has been made to obtain as much relevant data as possible for this evaluation. It is important to note that the conclusions that lead to this procedure are derived in large part from the available data. Always take into account that the treatment will also be dependent on availability of resources and existing treatment guidelines, considered by other Pain Management Practitioners as being common knowledge and practice, at the time of the intervention. For Medico-Legal purposes, it is also important to point out that variation in procedural techniques and pharmacological choices are the acceptable norm. The indications, contraindications, technique, and results of the above procedure should only be interpreted and judged by a Board-Certified Interventional Pain Specialist with extensive familiarity and expertise in the same exact procedure and technique.

## 2017-04-08 NOTE — Patient Instructions (Addendum)
____________________________________________________________________________________________  Post-Procedure instructions Instructions:  Apply ice: Fill a plastic sandwich bag with crushed ice. Cover it with a small towel and apply to injection site. Apply for 15 minutes then remove x 15 minutes. Repeat sequence on day of procedure, until you go to bed. The purpose is to minimize swelling and discomfort after procedure.  Apply heat: Apply heat to procedure site starting the day following the procedure. The purpose is to treat any soreness and discomfort from the procedure.  Food intake: Start with clear liquids (like water) and advance to regular food, as tolerated.   Physical activities: Keep activities to a minimum for the first 8 hours after the procedure.   Driving: If you have received any sedation, you are not allowed to drive for 24 hours after your procedure.  Blood thinner: Restart your blood thinner 6 hours after your procedure. (Only for those taking blood thinners)  Insulin: As soon as you can eat, you may resume your normal dosing schedule. (Only for those taking insulin)  Infection prevention: Keep procedure site clean and dry.  Post-procedure Pain Diary: Extremely important that this be done correctly and accurately. Recorded information will be used to determine the next step in treatment.  Pain evaluated is that of treated area only. Do not include pain from an untreated area.  Complete every hour, on the hour, for the initial 8 hours. Set an alarm to help you do this part accurately.  Do not go to sleep and have it completed later. It will not be accurate.  Follow-up appointment: Keep your follow-up appointment after the procedure. Usually 2 weeks for most procedures. (6 weeks in the case of radiofrequency.) Bring you pain diary.  Expect:  From numbing medicine (AKA: Local Anesthetics): Numbness or decrease in pain.  Onset: Full effect within 15 minutes of  injected.  Duration: It will depend on the type of local anesthetic used. On the average, 1 to 8 hours.   From steroids: Decrease in swelling or inflammation. Once inflammation is improved, relief of the pain will follow.  Onset of benefits: Depends on the amount of swelling present. The more swelling, the longer it will take for the benefits to be seen. In some cases, up to 10 days.  Duration: Steroids will stay in the system x 2 weeks. Duration of benefits will depend on multiple posibilities including persistent irritating factors.  From procedure: Some discomfort is to be expected once the numbing medicine wears off. This should be minimal if ice and heat are applied as instructed. Call if:  You experience numbness and weakness that gets worse with time, as opposed to wearing off.  New onset bowel or bladder incontinence. (Spinal procedures only)  Emergency Numbers:  Durning business hours (Monday - Thursday, 8:00 AM - 4:00 PM) (Friday, 9:00 AM - 12:00 Noon): [phone]  After hours: [phone] ____________________________________________________________________________________________    Knee Injection A knee injection is a procedure to get medicine into your knee joint. Your health care provider puts a needle into the joint and injects medicine with an attached syringe. The injected medicine may relieve the pain, swelling, and stiffness of arthritis. The injected medicine may also help to lubricate and cushion your knee joint. You may need more than one injection. Tell a health care provider about:  Any allergies you have.  All medicines you are taking, including vitamins, herbs, eye drops, creams, and over-the-counter medicines.  Any problems you or family members have had with anesthetic medicines.  Any blood  disorders you have.  Any surgeries you have had.  Any medical conditions you have. What are the risks? Generally, this is a safe procedure. However,  problems may occur, including:  Infection.  Bleeding.  Worsening symptoms.  Damage to the area around your knee.  Allergic reaction to any of the medicines.  Skin reactions from repeated injections.  What happens before the procedure?  Ask your health care provider about changing or stopping your regular medicines. This is especially important if you are taking diabetes medicines or blood thinners.  Plan to have someone take you home after the procedure. What happens during the procedure?  You will sit or lie down in a position for your knee to be treated.  The skin over your kneecap will be cleaned with a germ-killing solution (antiseptic).  You will be given a medicine that numbs the area (local anesthetic). You may feel some stinging.  After your knee becomes numb, you will have a second injection. This is the medicine. This needle is carefully placed between your kneecap and your knee. The medicine is injected into the joint space.  At the end of the procedure, the needle will be removed.  A bandage (dressing) may be placed over the injection site. The procedure may vary among health care providers and hospitals. What happens after the procedure?  You may have to move your knee through its full range of motion. This helps to get all of the medicine into your joint space.  Your blood pressure, heart rate, breathing rate, and blood oxygen level will be monitored often until the medicines you were given have worn off.  You will be watched to make sure that you do not have a reaction to the injected medicine. This information is not intended to replace advice given to you by your health care provider. Make sure you discuss any questions you have with your health care provider. Document Released: 05/17/2006 Document Revised: 07/26/2015 Document Reviewed: 01/03/2014 Elsevier Interactive Patient Education  2018 [employer].  Facet Joint Block The facet joints connect the  bones of the spine (vertebrae). They make it possible for you to bend, twist, and make other movements with your spine. They also keep you from bending too far, twisting too far, and making other excessive movements. A facet joint block is a procedure where a numbing medicine (anesthetic) is injected into a facet joint. Often, a type of anti-inflammatory medicine called a steroid is also injected. A facet joint block may be done to diagnose neck or back pain. If the pain gets better after a facet joint block, it means the pain is probably coming from the facet joint. If the pain does not get better, it means the pain is probably not coming from the facet joint. A facet joint block may also be done to relieve neck or back pain caused by an inflamed facet joint. A facet joint block is only done to relieve pain if the pain does not improve with other methods, such as medicine, exercise programs, and physical therapy. Tell a health care provider about: Any allergies you have. All medicines you are taking, including vitamins, herbs, eye drops, creams, and over-the-counter medicines. Any problems you or family members have had with anesthetic medicines. Any blood disorders you have. Any surgeries you have had. Any medical conditions you have. Whether you are pregnant or may be pregnant. What are the risks? Generally, this is a safe procedure. However, problems may occur, including: Bleeding. Injury to a nerve  near the injection site. Pain at the injection site. Weakness or numbness in areas controlled by nerves near the injection site. Infection. Temporary fluid retention. Allergic reactions to medicines or dyes. Injury to other structures or organs near the injection site.  What happens before the procedure? Follow instructions from your health care provider about eating or drinking restrictions. Ask your health care provider about: Changing or stopping your regular medicines. This is especially  important if you are taking diabetes medicines or blood thinners. Taking medicines such as aspirin and ibuprofen. These medicines can thin your blood. Do not take these medicines before your procedure if your health care provider instructs you not to. Do not take any new dietary supplements or medicines without asking your health care provider first. Plan to have someone take you home after the procedure. What happens during the procedure? You may need to remove your clothing and dress in an open-back gown. The procedure will be done while you are lying on an X-ray table. You will most likely be asked to lie on your stomach, but you may be asked to lie in a different position if an injection will be made in your neck. Machines will be used to monitor your oxygen levels, heart rate, and blood pressure. If an injection will be made in your neck, an IV tube will be inserted into one of your veins. Fluids and medicine will flow directly into your body through the IV tube. The area over the facet joint where the injection will be made will be cleaned with soap. The surrounding skin will be covered with clean drapes. A numbing medicine (local anesthetic) will be applied to your skin. Your skin may sting or burn for a moment. A video X-ray machine (fluoroscopy) will be used to locate the joint. In some cases, a CT scan may be used. A contrast dye may be injected into the facet joint area to help locate the joint. When the joint is located, an anesthetic will be injected into the joint through the needle. Your health care provider will ask you whether you feel pain relief. If you do feel relief, a steroid may be injected to provide pain relief for a longer period of time. If you do not feel relief or feel only partial relief, additional injections of an anesthetic may be made in other facet joints. The needle will be removed. Your skin will be cleaned. A bandage (dressing) will be applied over each  injection site. The procedure may vary among health care providers and hospitals. What happens after the procedure? You will be observed for 15-30 minutes before being allowed to go home. This information is not intended to replace advice given to you by your health care provider. Make sure you discuss any questions you have with your health care provider. Document Released: 07/15/2006 Document Revised: 03/27/2015 Document Reviewed: 11/19/2014 Elsevier Interactive Patient Education  [employer].

## 2017-04-09 ENCOUNTER — Telehealth: Payer: Self-pay

## 2017-04-09 NOTE — Telephone Encounter (Signed)
Procedure phone call.  Left messge.

## 2017-04-26 ENCOUNTER — Ambulatory Visit: Payer: Medicare Other | Attending: Pain Medicine | Admitting: Pain Medicine

## 2017-04-26 ENCOUNTER — Encounter: Payer: Self-pay | Admitting: Pain Medicine

## 2017-04-26 VITALS — BP 153/73 | HR 67 | Temp 98.6°F | Resp 16 | Ht 69.0 in | Wt 230.0 lb

## 2017-04-26 DIAGNOSIS — E785 Hyperlipidemia, unspecified: Secondary | ICD-10-CM | POA: Insufficient documentation

## 2017-04-26 DIAGNOSIS — Z79891 Long term (current) use of opiate analgesic: Secondary | ICD-10-CM | POA: Insufficient documentation

## 2017-04-26 DIAGNOSIS — E1122 Type 2 diabetes mellitus with diabetic chronic kidney disease: Secondary | ICD-10-CM | POA: Diagnosis not present

## 2017-04-26 DIAGNOSIS — E669 Obesity, unspecified: Secondary | ICD-10-CM | POA: Insufficient documentation

## 2017-04-26 DIAGNOSIS — M47816 Spondylosis without myelopathy or radiculopathy, lumbar region: Secondary | ICD-10-CM

## 2017-04-26 DIAGNOSIS — K219 Gastro-esophageal reflux disease without esophagitis: Secondary | ICD-10-CM | POA: Insufficient documentation

## 2017-04-26 DIAGNOSIS — M1612 Unilateral primary osteoarthritis, left hip: Secondary | ICD-10-CM | POA: Insufficient documentation

## 2017-04-26 DIAGNOSIS — M25562 Pain in left knee: Secondary | ICD-10-CM | POA: Diagnosis not present

## 2017-04-26 DIAGNOSIS — M5442 Lumbago with sciatica, left side: Secondary | ICD-10-CM

## 2017-04-26 DIAGNOSIS — I129 Hypertensive chronic kidney disease with stage 1 through stage 4 chronic kidney disease, or unspecified chronic kidney disease: Secondary | ICD-10-CM | POA: Diagnosis not present

## 2017-04-26 DIAGNOSIS — M5441 Lumbago with sciatica, right side: Secondary | ICD-10-CM

## 2017-04-26 DIAGNOSIS — M47896 Other spondylosis, lumbar region: Secondary | ICD-10-CM | POA: Diagnosis not present

## 2017-04-26 DIAGNOSIS — E559 Vitamin D deficiency, unspecified: Secondary | ICD-10-CM | POA: Insufficient documentation

## 2017-04-26 DIAGNOSIS — G894 Chronic pain syndrome: Secondary | ICD-10-CM | POA: Diagnosis not present

## 2017-04-26 DIAGNOSIS — N181 Chronic kidney disease, stage 1: Secondary | ICD-10-CM | POA: Insufficient documentation

## 2017-04-26 DIAGNOSIS — Z6833 Body mass index (BMI) 33.0-33.9, adult: Secondary | ICD-10-CM | POA: Insufficient documentation

## 2017-04-26 DIAGNOSIS — M4802 Spinal stenosis, cervical region: Secondary | ICD-10-CM | POA: Insufficient documentation

## 2017-04-26 DIAGNOSIS — M79605 Pain in left leg: Secondary | ICD-10-CM | POA: Insufficient documentation

## 2017-04-26 DIAGNOSIS — J45909 Unspecified asthma, uncomplicated: Secondary | ICD-10-CM | POA: Insufficient documentation

## 2017-04-26 DIAGNOSIS — M25561 Pain in right knee: Secondary | ICD-10-CM | POA: Diagnosis not present

## 2017-04-26 DIAGNOSIS — G8929 Other chronic pain: Secondary | ICD-10-CM

## 2017-04-26 DIAGNOSIS — Z79899 Other long term (current) drug therapy: Secondary | ICD-10-CM | POA: Insufficient documentation

## 2017-04-26 DIAGNOSIS — M545 Low back pain: Secondary | ICD-10-CM | POA: Diagnosis not present

## 2017-04-26 DIAGNOSIS — Z794 Long term (current) use of insulin: Secondary | ICD-10-CM | POA: Diagnosis not present

## 2017-04-26 DIAGNOSIS — M48062 Spinal stenosis, lumbar region with neurogenic claudication: Secondary | ICD-10-CM | POA: Diagnosis not present

## 2017-04-26 DIAGNOSIS — G4733 Obstructive sleep apnea (adult) (pediatric): Secondary | ICD-10-CM | POA: Insufficient documentation

## 2017-04-26 DIAGNOSIS — M5416 Radiculopathy, lumbar region: Secondary | ICD-10-CM | POA: Insufficient documentation

## 2017-04-26 DIAGNOSIS — M170 Bilateral primary osteoarthritis of knee: Secondary | ICD-10-CM | POA: Insufficient documentation

## 2017-04-26 DIAGNOSIS — M79604 Pain in right leg: Secondary | ICD-10-CM | POA: Insufficient documentation

## 2017-04-26 DIAGNOSIS — G2581 Restless legs syndrome: Secondary | ICD-10-CM | POA: Insufficient documentation

## 2017-04-26 DIAGNOSIS — M25552 Pain in left hip: Secondary | ICD-10-CM | POA: Diagnosis not present

## 2017-04-26 DIAGNOSIS — Z5181 Encounter for therapeutic drug level monitoring: Secondary | ICD-10-CM | POA: Diagnosis not present

## 2017-04-26 DIAGNOSIS — Z7982 Long term (current) use of aspirin: Secondary | ICD-10-CM | POA: Insufficient documentation

## 2017-04-26 NOTE — Progress Notes (Signed)
Patient's Name: Joshua Humphrey  MRN: [id number]  Referring Provider: Kirk Ruths, MD  DOB: June 06, 1929  PCP: Kirk Ruths, MD  DOS: 04/26/2017  Note by: Gaspar Cola, MD  Service setting: Ambulatory outpatient  Specialty: Interventional Pain Management  Location: [facility] (AMB) Pain Management Facility    Patient type: Established   Primary Reason(s) for Visit: Encounter for post-procedure evaluation of chronic illness with mild to moderate exacerbation CC: Knee Pain (bilateral right is worse) and Back Pain (lower left)  HPI  Joshua Humphrey is a 84 y.o. year old, male patient, who comes today for a post-procedure evaluation. 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); Chronic pain syndrome; Long term (current) use of opiate analgesic; Long term prescription opiate use; Opiate use (30 MME/Day); Chronic low back pain (Primary Area of Pain) (Bilateral) (R>L); Chronic lower extremity pain (Tertiary source of pain) (Bilateral) (L>R); Abnormal MRI, lumbar spine; Abnormal MRI, cervical spine; Lumbar spinal stenosis with neurogenic claudication; Disturbance of skin sensation; Chronic hip pain (Secondary Area of Pain) (Left); Chronic knee pain (Bilateral) (L>R); Generalized weakness; Muscle weakness (generalized); Chronic Lumbar radicular pain (Left) (L5); Neurogenic pain; Osteoarthritis; Lumbar foraminal stenosis (Right: L4-5 and L5-S1) (Left: L2-3, L3-4, and L4-5); Osteoarthritis of hip (Left); Chronic  hip arthralgia (Left); Chronic arthralgias of knees and hips (Left); Vitamin D insufficiency; Cervical spinal stenosis; Cervical foraminal stenosis; Lumbar facet syndrome (Bilateral); Osteoarthritis of lumbar spine; Osteoarthritis of knee (Bilateral); Abnormal knee x-ray (2017); and Lumbar facet joint osteoarthritis (Bilateral) on their problem list. His primarily concern today is the Knee Pain (bilateral right is worse) and Back Pain (lower left)  Pain Assessment: Location: Left, Right Knee(left knee is better, right knee is painful.  back ) Radiating: back pain is radiating into the left hip and causes left hip to feel unstable.  Onset: More than a month ago Duration: Chronic pain Quality: Discomfort, Radiating Severity: 5 /10 (self-reported pain score)  Note: Reported level is inconsistent with clinical observations. Clinically the patient looks like a 1/10 A 1/10 is viewed as "Mild" and described as nagging, annoying, but not interfering with basic activities of daily living (ADL). Joshua Humphrey is able to eat, bathe, get dressed, do toileting (being able to get on and off the toilet and perform personal hygiene functions), transfer (move in and out of bed or a chair without assistance), and maintain continence (able to control bladder and bowel functions). Physiologic parameters such as blood pressure and heart rate apear wnl. [name] does not seem to understand the use of our objective pain scale When using our objective Pain Scale, levels between 6 and 10/10 are said to belong in an emergency room, as it progressively worsens from a 6/10, described as severely limiting, requiring emergency care not usually available at an outpatient pain management facility. At a 6/10 level, communication becomes difficult and requires great effort. Assistance to reach the emergency department may be required. Facial flushing and profuse sweating along with potentially dangerous increases in heart rate and blood  pressure will be evident. Effect on ADL: affects stability Timing: Intermittent Modifying factors: everything is better with rest.   Joshua Humphrey comes in today  for post-procedure evaluation after the treatment done on 04/08/2017.  Further details on both, my assessment(s), as well as the proposed treatment plan, please see below.  Post-Procedure Assessment  04/08/2017 Procedure: Diagnostic bilateral lumbar facet block #2 + therapeutic bilateral intra-articular Hyalgan knee injection #5 under fluoroscopic guidance, no sedation. Pre-procedure pain score:  4/10 Post-procedure pain score: 0/10 (100% relief) Influential Factors: BMI: 33.97 kg/m Intra-procedural challenges: None observed.         Assessment challenges: None detected.              Reported side-effects: None.        Post-procedural adverse reactions or complications: None reported         Sedation: No sedation used. When no sedatives are used, the analgesic levels obtained are directly associated to the effectiveness of the local anesthetics. However, when sedation is provided, the level of analgesia obtained during the initial 1 hour following the intervention, is believed to be the result of a combination of factors. These factors may include, but are not limited to: 1. The effectiveness of the local anesthetics used. 2. The effects of the analgesic(s) and/or anxiolytic(s) used. 3. The degree of discomfort experienced by the patient at the time of the procedure. 4. The patients ability and reliability in recalling and recording the events. 5. The presence and influence of possible secondary gains and/or psychosocial factors. Reported result: Relief experienced during the 1st hour after the procedure: 100 % (Ultra-Short Term Relief)            Interpretative annotation: Clinically appropriate result. No IV Analgesic or Anxiolytic given, therefore benefits are completely due to Local Anesthetic effects.          Effects of local  anesthetic: The analgesic effects attained during this period are directly associated to the localized infiltration of local anesthetics and therefore cary significant diagnostic value as to the etiological location, or anatomical origin, of the pain. Expected duration of relief is directly dependent on the pharmacodynamics of the local anesthetic used. Long-acting (4-6 hours) anesthetics used.  Reported result: Relief during the next 4 to 6 hour after the procedure: 100 % (Short-Term Relief)            Interpretative annotation: Clinically appropriate result. Analgesia during this period is likely to be Local Anesthetic-related.          Long-term benefit: Defined as the period of time past the expected duration of local anesthetics (1 hour for short-acting and 4-6 hours for long-acting). With the possible exception of prolonged sympathetic blockade from the local anesthetics, benefits during this period are typically attributed to, or associated with, other factors such as analgesic sensory neuropraxia, antiinflammatory effects, or beneficial biochemical changes provided by agents other than the local anesthetics.  Reported result: Extended relief following procedure: 100 %(back pain relief lasted for a long while but is now affecting left hip.  left knee has minimal pain and right is approx 4 -5 on pain scale. ) (Long-Term Relief)            Interpretative annotation: Clinically appropriate result. Good relief. No permanent benefit expected. Inflammation plays a part in the etiology to the pain.          Current benefits: Defined as reported results that persistent at this point in time.   Analgesia: 75-100 % Joshua Humphrey reports that both, extremity and the axial pain improved with the treatment. Function: Joshua Humphrey reports improvement in function ROM: Joshua Humphrey reports improvement in  ROM Interpretative annotation: Ongoing benefit. Therapeutic success. Effective therapeutic approach. Benefit could be  steroid-related.  Interpretation: Results would suggest a successful diagnostic intervention.                  Plan:  Set up procedure as a PRN palliative treatment option for this patient.                 Laboratory Chemistry  Inflammation Markers (CRP: Acute Phase) (ESR: Chronic Phase) Lab Results  Component Value Date   CRP 0.7 09/08/2016   ESRSEDRATE 16 09/08/2016                         Renal Function Markers Lab Results  Component Value Date   BUN 20 09/08/2016   CREATININE 0.75 (L) 09/08/2016   GFRAA 97 09/08/2016   GFRNONAA 84 09/08/2016                 Hepatic Function Markers Lab Results  Component Value Date   AST 17 09/08/2016   ALT 15 09/08/2016   ALBUMIN 4.2 09/08/2016   ALKPHOS 64 09/08/2016                 Electrolytes Lab Results  Component Value Date   NA 144 09/08/2016   K 3.8 09/08/2016   CL 105 09/08/2016   CALCIUM 9.2 09/08/2016   MG 1.8 09/08/2016                        Neuropathy Markers Lab Results  Component Value Date   VITAMINB12 264 09/08/2016                 Bone Pathology Markers Lab Results  Component Value Date   25OHVITD1 41 09/08/2016   25OHVITD2 <1.0 09/08/2016   25OHVITD3 41 09/08/2016                         Coagulation Parameters Lab Results  Component Value Date   PLT 275 01/20/2016                 Cardiovascular Markers Lab Results  Component Value Date   HGB 12.1 (L) 01/20/2016   HCT 36.3 (L) 01/20/2016                 Note: Lab results reviewed.  Recent Diagnostic Imaging Results  DG C-Arm 1-60 Min-No Report Fluoroscopy was utilized by the requesting physician.  No radiographic  interpretation.   Complexity Note: I personally reviewed the fluoroscopic imaging of the procedure.                        Meds   Current Outpatient Medications:  .  acetaminophen (TYLENOL) 650 MG CR tablet, Take 650-1,300 mg by mouth every 8 (eight) hours as needed (for pain.). , Disp: , Rfl:  .  aspirin EC 81 MG  tablet, Take 81 mg by mouth daily. , Disp: , Rfl:  .  atorvastatin (LIPITOR) 40 MG tablet, Take 40 mg by mouth daily. , Disp: , Rfl:  .  B-D ULTRAFINE III SHORT PEN 31G X 8 MM MISC, USE BID UTD, Disp: , Rfl: 11 .  Calcium Carbonate-Vitamin D (CALTRATE 600+D PO), Take 2 tablets by mouth daily., Disp: , Rfl:  .  carvedilol (COREG) 25 MG tablet, TAKE 1 TABLET (25 MG) TWICE DAILY, Disp: , Rfl:  .  cetirizine (ZYRTEC) 10 MG tablet, Take 10 mg by mouth every evening. , Disp: , Rfl:  .  ferrous fumarate (HEMOCYTE - 106 MG FE) 325 (106 FE) MG TABS tablet, Take 1 tablet by mouth 2 (two) times daily. , Disp: , Rfl:  .  FLAXSEED, LINSEED, PO, Take by mouth daily., Disp: , Rfl:  .  fluticasone (FLONASE) 50 MCG/ACT nasal spray, Place 1-2 sprays into both nostrils daily as needed (for allergies.). , Disp: , Rfl:  .  furosemide (LASIX) 40 MG tablet, TAKE 1 TABLET BY MOUTH TWICE DAILY, Disp: , Rfl:  .  gabapentin (NEURONTIN) 300 MG capsule, Take 1-3 capsules (300-900 mg total) by mouth 4 (four) times daily. Follow titration schedule., Disp: 360 capsule, Rfl: 5 .  Glucosamine-Chondroit-Vit C-Mn (GLUCOSAMINE-CHONDROITIN) CAPS, Take 2 capsules by mouth daily., Disp: , Rfl:  .  hydrALAZINE (APRESOLINE) 50 MG tablet, Take 50 mg by mouth 3 (three) times daily. , Disp: , Rfl:  .  Ipratropium-Albuterol (COMBIVENT RESPIMAT) 20-100 MCG/ACT AERS respimat, INHALE 2 PUFF FOUR TIMES DAILY AS NEEDED FOR SHORTNESS OF BREATH AND/OR WHEEZING., Disp: , Rfl:  .  LEVEMIR FLEXTOUCH 100 UNIT/ML Pen, Inject 15 Units into the skin every evening. , Disp: , Rfl:  .  losartan (COZAAR) 100 MG tablet, Take 100 mg by mouth daily. , Disp: , Rfl:  .  magnesium oxide (MAG-OX) 400 MG tablet, Take 400 mg by mouth daily., Disp: , Rfl:  .  metFORMIN (GLUCOPHAGE) 850 MG tablet, Take 850 mg by mouth 2 (two) times daily. , Disp: , Rfl:  .  mometasone-formoterol (DULERA) 200-5 MCG/ACT AERO, Inhale 2 puffs into the lungs 2 (two) times daily. , Disp: , Rfl:   .  montelukast (SINGULAIR) 10 MG tablet, Take 10 mg by mouth at bedtime., Disp: , Rfl:  .  Multiple Vitamins-Minerals (OCUVITE ADULT 50+) CAPS, Take 1 capsule by mouth daily. , Disp: , Rfl:  .  NIFEdipine (PROCARDIA XL/ADALAT-CC) 90 MG 24 hr tablet, Take 90 mg by mouth daily. , Disp: , Rfl:  .  omeprazole (PRILOSEC) 20 MG capsule, Take 20 mg by mouth daily., Disp: , Rfl:  .  potassium chloride SA (K-DUR,KLOR-CON) 20 MEQ tablet, TAKE 1 TABLET(20 MEQ) BY MOUTH TWICE DAILY, Disp: , Rfl:  .  pramipexole (MIRAPEX) 0.25 MG tablet, Take 0.25 mg by mouth at bedtime as needed (for restless leg syndrome). , Disp: , Rfl:  .  sitaGLIPtin (JANUVIA) 100 MG tablet, Take 100 mg by mouth daily., Disp: , Rfl:  .  traMADol (ULTRAM) 50 MG tablet, Take 1 tablet (50 mg total) by mouth daily as needed for severe pain., Disp: 180 tablet, Rfl: 0  ROS  Constitutional: Denies any fever or chills Gastrointestinal: No reported hemesis, hematochezia, vomiting, or acute GI distress Musculoskeletal: Denies any acute onset joint swelling, redness, loss of ROM, or weakness Neurological: No reported episodes of acute onset apraxia, aphasia, dysarthria, agnosia, amnesia, paralysis, loss of coordination, or loss of consciousness  Allergies  Joshua Humphrey has No Known Allergies.  Joshua Humphrey  Drug: Joshua Humphrey  reports that he does not use drugs. Alcohol:  reports that he drinks alcohol. Tobacco:  reports that  has never smoked. he has never used smokeless tobacco. Medical:  has a past medical history of Anemia, Asthma, Cancer ([place]), Cancer ([place]), Diabetes mellitus without complication ([place]), GERD (gastroesophageal reflux disease), Hypertension, PONV (postoperative nausea and vomiting), Septic olecranon bursitis of left elbow (01/22/2016), and Sleep apnea. Surgical: Joshua Humphrey  has a past surgical history  that includes tumor removed (2000); Prostate surgery (2002); lipoma removal (2000); Tonsillectomy; Cataract extraction, bilateral; Toe  Surgery; Olecranon bursectomy (Left, 01/21/2016); Elbow surgery; and Eye surgery. Family: family history includes Alcohol abuse in his father; Cancer in his brother and mother.  Constitutional Exam  General appearance: Well nourished, well developed, and well hydrated. In no apparent acute distress Vitals:   04/26/17 1255  BP: (!) 153/73  Pulse: 67  Resp: 16  Temp: 98.6 F (37 C)  TempSrc: Oral  SpO2: 95%  Weight: 230 lb (104.3 kg)  Height: _0  (1.753 m)   BMI Assessment: Estimated body mass index is 33.97 kg/m as calculated from the following:   Height as of this encounter: _1  (1.753 m).   Weight as of this encounter: 230 lb (104.3 kg).  BMI interpretation table: BMI level Category Range association with higher incidence of chronic pain  <18 kg/m2 Underweight   18.5-24.9 kg/m2 Ideal body weight   25-29.9 kg/m2 Overweight Increased incidence by 20%  30-34.9 kg/m2 Obese (Class I) Increased incidence by 68%  35-39.9 kg/m2 Severe obesity (Class II) Increased incidence by 136%  >40 kg/m2 Extreme obesity (Class III) Increased incidence by 254%   BMI Readings from Last 4 Encounters:  04/26/17 33.97 kg/m  04/08/17 33.97 kg/m  03/25/17 32.49 kg/m  02/25/17 32.78 kg/m   Wt Readings from Last 4 Encounters:  04/26/17 230 lb (104.3 kg)  04/08/17 230 lb (104.3 kg)  03/25/17 220 lb (99.8 kg)  02/25/17 222 lb (100.7 kg)  Psych/Mental status: Alert, oriented x 3 (person, place, & time)       Eyes: PERLA Respiratory: No evidence of acute respiratory distress  Cervical Spine Area Exam  Skin & Axial Inspection: No masses, redness, edema, swelling, or associated skin lesions Alignment: Symmetrical Functional ROM: Unrestricted ROM      Stability: No instability detected Muscle Tone/Strength: Functionally intact. No obvious neuro-muscular anomalies detected. Sensory (Neurological): Unimpaired Palpation: No palpable anomalies              Upper Extremity (UE) Exam    Side:  Right upper extremity  Side: Left upper extremity  Skin & Extremity Inspection: Skin color, temperature, and hair growth are WNL. No peripheral edema or cyanosis. No masses, redness, swelling, asymmetry, or associated skin lesions. No contractures.  Skin & Extremity Inspection: Skin color, temperature, and hair growth are WNL. No peripheral edema or cyanosis. No masses, redness, swelling, asymmetry, or associated skin lesions. No contractures.  Functional ROM: Unrestricted ROM          Functional ROM: Unrestricted ROM          Muscle Tone/Strength: Functionally intact. No obvious neuro-muscular anomalies detected.  Muscle Tone/Strength: Functionally intact. No obvious neuro-muscular anomalies detected.  Sensory (Neurological): Unimpaired          Sensory (Neurological): Unimpaired          Palpation: No palpable anomalies              Palpation: No palpable anomalies              Specialized Test(s): Deferred         Specialized Test(s): Deferred          Thoracic Spine Area Exam  Skin & Axial Inspection: No masses, redness, or swelling Alignment: Symmetrical Functional ROM: Unrestricted ROM Stability: No instability detected Muscle Tone/Strength: Functionally intact. No obvious neuro-muscular anomalies detected. Sensory (Neurological): Unimpaired Muscle strength & Tone: No palpable anomalies  Lumbar Spine Area Exam  Skin & Axial Inspection: No masses, redness, or swelling Alignment: Symmetrical Functional ROM: Improved after treatment      Stability: No instability detected Muscle Tone/Strength: Functionally intact. No obvious neuro-muscular anomalies detected. Sensory (Neurological): Improved Palpation: No palpable anomalies       Provocative Tests: Lumbar Hyperextension and rotation test: evaluation deferred today       Lumbar Lateral bending test: evaluation deferred today       Patrick's Maneuver: evaluation deferred today                    Gait & Posture Assessment  Ambulation:  Patient ambulates using a walker Gait: Very limited, using assistive device to ambulate Posture: Antalgic   Lower Extremity Exam    Side: Right lower extremity  Side: Left lower extremity  Skin & Extremity Inspection: Skin color, temperature, and hair growth are WNL. No peripheral edema or cyanosis. No masses, redness, swelling, asymmetry, or associated skin lesions. No contractures.  Skin & Extremity Inspection: Skin color, temperature, and hair growth are WNL. No peripheral edema or cyanosis. No masses, redness, swelling, asymmetry, or associated skin lesions. No contractures.  Functional ROM: Unrestricted ROM          Functional ROM: Unrestricted ROM          Muscle Tone/Strength: Functionally intact. No obvious neuro-muscular anomalies detected.  Muscle Tone/Strength: Functionally intact. No obvious neuro-muscular anomalies detected.  Sensory (Neurological): Unimpaired  Sensory (Neurological): Unimpaired  Palpation: No palpable anomalies  Palpation: No palpable anomalies   Assessment  Primary Diagnosis & Pertinent Problem List: The primary encounter diagnosis was Chronic low back pain (Primary Area of Pain) (Bilateral) (R>L). Diagnoses of Chronic knee pain (Bilateral) (L>R), Chronic hip pain (Secondary Area of Pain) (Left), Chronic lower extremity pain (Tertiary source of pain) (Bilateral) (L>R), and Lumbar facet syndrome (Bilateral) were also pertinent to this visit.  Status Diagnosis  Controlled Controlled Persistent 1. Chronic low back pain (Primary Area of Pain) (Bilateral) (R>L)   2. Chronic knee pain (Bilateral) (L>R)   3. Chronic hip pain (Secondary Area of Pain) (Left)   4. Chronic lower extremity pain (Tertiary source of pain) (Bilateral) (L>R)   5. Lumbar facet syndrome (Bilateral)     Problems updated and reviewed during this visit: No problems updated. Plan of Care  Pharmacotherapy (Medications Ordered): No orders of the defined types were placed in this  encounter.  Medications administered today: Joshua Humphrey. [place] had no medications administered during this visit.  Procedure Orders    No procedure(s) ordered today   Lab Orders  No laboratory test(s) ordered today   Imaging Orders  No imaging studies ordered today   Referral Orders  No referral(s) requested today    Interventional management options: Planned, scheduled, and/or pending:   Not at this point.   Considering:   Diagnostic left intra-articular hip joint injection Possible diagnostic left femoral nerve + obturator nerve block Possible left femoral nerve + obturator nerve RFA Diagnostic left L2-3 interlaminar lumbar epiduralsteroid injection Diagnostic left L3-4 interlaminar lumbar epiduralsteroid injection Diagnosticleft L4-5 interlaminar lumbar epiduralsteroid injection Diagnosticleft L2-3, L3-4 and L4-5 transforaminal epiduralsteroid injection  Diagnostic right L4-5 and L5-S1 transforaminal epiduralsteroid injection  Diagnostic bilateral lumbar facet block#3  Possible bilateral lumbar facet RFA Diagnostic bilateral intra-articular knee joint injection with local anesthetic and steroids Possible series of 5 bilateral intra-articular Hyalgan knee injections (completed on 04/08/17)  Diagnostic bilateral genicular nerve block Possible bilateral genicular nerve RFA    Palliative PRN treatment(s):  Palliative/Diagnostic bilateral lumbar facet block#3  Palliativeleft intra-articular hip joint injection Diagnostic left L2-3 interlaminar lumbar epiduralsteroid injection Diagnostic left L3-4 interlaminar lumbar epiduralsteroid injection Diagnosticleft L4-5 interlaminar lumbar epiduralsteroid injection Diagnosticleft L2-3, L3-4 and L4-5 transforaminal epiduralsteroid injection  Diagnostic right L4-5 and L5-S1 transforaminal epiduralsteroid injection    Provider-requested follow-up: Return for PRN Procedure, w/ Dr. Dossie Arbour, Med-Mgmt, w/ Joshua David, NP.  Future Appointments  Date Time Provider [place]  04/30/2017  3:45 PM [facility] None  08/16/2017 11:00 AM Vevelyn Francois, NP [facility] None   Primary Care Physician: Kirk Ruths, MD Location: [facility] Note by: Gaspar Cola, MD Date: 04/26/2017; Time: 4:34 PM

## 2017-04-28 DIAGNOSIS — R270 Ataxia, unspecified: Secondary | ICD-10-CM | POA: Insufficient documentation

## 2017-04-30 ENCOUNTER — Encounter: Payer: Self-pay | Admitting: Urology

## 2017-04-30 ENCOUNTER — Ambulatory Visit (INDEPENDENT_AMBULATORY_CARE_PROVIDER_SITE_OTHER): Payer: Medicare Other | Admitting: Urology

## 2017-04-30 VITALS — BP 172/90 | HR 71 | Ht 66.0 in | Wt 230.0 lb

## 2017-04-30 DIAGNOSIS — Z8546 Personal history of malignant neoplasm of prostate: Secondary | ICD-10-CM | POA: Diagnosis not present

## 2017-04-30 DIAGNOSIS — R350 Frequency of micturition: Secondary | ICD-10-CM | POA: Diagnosis not present

## 2017-04-30 NOTE — Progress Notes (Signed)
04/30/2017 1:40 PM   Humphrey Humphrey 23-Nov-1929 [id number]  Referring provider: Kirk Ruths, MD [facility] [place], [place] [address]  Chief Complaint  Patient presents with  . Follow-up    HPI: F/u - PCa - prostate cancer status post prostatectomy in 2001 who presents for further evaluation. His PSA was undetectable in October 2017. He transfered his care from a urologist in [facility] Aug 2018. Patient was getting Lupron 22.5 (3 month) mg IM every 6 months. He does not think that he had metastatic prostate cancer. He had been on Lupron since his prostate was removed per the patient. It is not clear why he was on Lupron or if he had recurrence of disease. His last Lupron was in 2017. Since then, ADT has been held. His PSA remains undetectable.     He has frequency. Incontinence mild. No gross hematuria.     PMH: Past Medical History:  Diagnosis Date  . Anemia   . Asthma   . Cancer [facility])    prostate  . Cancer ([place])    skin   . Diabetes mellitus without complication ([place])   . GERD (gastroesophageal reflux disease)   . Hypertension   . PONV (postoperative nausea and vomiting)   . Septic olecranon bursitis of left elbow 01/22/2016  . Sleep apnea     Surgical History: Past Surgical History:  Procedure Laterality Date  . CATARACT EXTRACTION, BILATERAL    . ELBOW SURGERY    . EYE SURGERY    . lipoma removal  2000   back   . OLECRANON BURSECTOMY Left 01/21/2016   Procedure: OLECRANON BURSA;  Surgeon: Corky Mull, MD;  Location: [facility] ORS;  Service: Orthopedics;  Laterality: Left;  . PROSTATE SURGERY  2002  . TOE SURGERY    . TONSILLECTOMY    . tumor removed  2000    Home Medications:  Allergies as of 04/30/2017   No Known Allergies     Medication List        Accurate as of 04/30/17  1:40 PM. Always use your most recent med list.          acetaminophen 650 MG CR tablet Commonly known as:  TYLENOL Take  650-1,300 mg by mouth every 8 (eight) hours as needed (for pain.).   aspirin EC 81 MG tablet Take 81 mg by mouth daily.   atorvastatin 40 MG tablet Commonly known as:  LIPITOR Take 40 mg by mouth daily.   B-D ULTRAFINE III SHORT PEN 31G X 8 MM Misc Generic drug:  Insulin Pen Needle USE BID UTD   CALTRATE 600+D PO Take 2 tablets by mouth daily.   carvedilol 25 MG tablet Commonly known as:  COREG TAKE 1 TABLET (25 MG) TWICE DAILY   cetirizine 10 MG tablet Commonly known as:  ZYRTEC Take 10 mg by mouth every evening.   COMBIVENT RESPIMAT 20-100 MCG/ACT Aers respimat Generic drug:  Ipratropium-Albuterol INHALE 2 PUFF FOUR TIMES DAILY AS NEEDED FOR SHORTNESS OF BREATH AND/OR WHEEZING.   ferrous fumarate 325 (106 Fe) MG Tabs tablet Commonly known as:  HEMOCYTE - 106 mg FE Take 1 tablet by mouth 2 (two) times daily.   FLAXSEED (LINSEED) PO Take by mouth daily.   fluticasone 50 MCG/ACT nasal spray Commonly known as:  FLONASE Place 1-2 sprays into both nostrils daily as needed (for allergies.).   furosemide 40 MG tablet Commonly known as:  LASIX TAKE 1 TABLET  BY MOUTH TWICE DAILY   gabapentin 300 MG capsule Commonly known as:  NEURONTIN Take 1-3 capsules (300-900 mg total) by mouth 4 (four) times daily. Follow titration schedule.   Glucosamine-Chondroitin Caps Take 2 capsules by mouth daily.   hydrALAZINE 50 MG tablet Commonly known as:  APRESOLINE Take 50 mg by mouth 3 (three) times daily.   LEVEMIR FLEXTOUCH 100 UNIT/ML Pen Generic drug:  Insulin Detemir Inject 15 Units into the skin every evening.   losartan 100 MG tablet Commonly known as:  COZAAR Take 100 mg by mouth daily.   magnesium oxide 400 MG tablet Commonly known as:  MAG-OX Take 400 mg by mouth daily.   metFORMIN 850 MG tablet Commonly known as:  GLUCOPHAGE Take 850 mg by mouth 2 (two) times daily.   mometasone-formoterol 200-5 MCG/ACT Aero Commonly known as:  DULERA Inhale 2 puffs into the  lungs 2 (two) times daily.   montelukast 10 MG tablet Commonly known as:  SINGULAIR Take 10 mg by mouth at bedtime.   NIFEdipine 90 MG 24 hr tablet Commonly known as:  PROCARDIA XL/ADALAT-CC Take 90 mg by mouth daily.   OCUVITE ADULT 50+ Caps Take 1 capsule by mouth daily.   omeprazole 20 MG capsule Commonly known as:  PRILOSEC Take 20 mg by mouth daily.   potassium chloride SA 20 MEQ tablet Commonly known as:  K-DUR,KLOR-CON TAKE 1 TABLET(20 MEQ) BY MOUTH TWICE DAILY   pramipexole 0.25 MG tablet Commonly known as:  MIRAPEX Take 0.25 mg by mouth at bedtime as needed (for restless leg syndrome).   sitaGLIPtin 100 MG tablet Commonly known as:  JANUVIA Take 100 mg by mouth daily.   traMADol 50 MG tablet Commonly known as:  ULTRAM Take 1 tablet (50 mg total) by mouth daily as needed for severe pain.       Allergies: No Known Allergies  Family History: Family History  Problem Relation Age of Onset  . Cancer Mother   . Alcohol abuse Father   . Cancer Brother     Social History:  reports that  has never smoked. he has never used smokeless tobacco. He reports that he drinks alcohol. He reports that he does not use drugs.  ROS: UROLOGY Frequent Urination?: No Hard to postpone urination?: No Burning/pain with urination?: No Get up at night to urinate?: No Leakage of urine?: No Urine stream starts and stops?: No Trouble starting stream?: No Do you have to strain to urinate?: No Blood in urine?: No Urinary tract infection?: No Sexually transmitted disease?: No Injury to kidneys or bladder?: No Painful intercourse?: No Weak stream?: No Erection problems?: Yes Penile pain?: No  Gastrointestinal Nausea?: No Vomiting?: No Indigestion/heartburn?: No Diarrhea?: No Constipation?: No  Constitutional Fever: No Night sweats?: No Weight loss?: No Fatigue?: No  Skin Skin rash/lesions?: No Itching?: No  Eyes Blurred vision?: No Double vision?:  No  Ears/Nose/Throat Sore throat?: No Sinus problems?: No  Hematologic/Lymphatic Swollen glands?: No Easy bruising?: No  Cardiovascular Leg swelling?: Yes Chest pain?: No  Respiratory Cough?: No Shortness of breath?: No     Musculoskeletal Back pain?: Yes Joint pain?: Yes  Neurological Headaches?: No Dizziness?: No  Psychologic Depression?: No Anxiety?: No  Physical Exam: BP (!) 172/90   Pulse 71   Ht 5\' 6"  (1.676 m)   Wt 104.3 kg (230 lb)   BMI 37.12 kg/m   Constitutional:  Alert and oriented, No acute distress. HEENT: [place] AT, moist mucus membranes.  Trachea midline, no masses. Cardiovascular:  No clubbing, cyanosis, or edema. Respiratory: Normal respiratory effort, no increased work of breathing. GI: Abdomen is soft, nontender, nondistended, no abdominal masses GU: No CVA tenderness.  Skin: No rashes, bruises or suspicious lesions. Neurologic: Grossly intact, no focal deficits, moving all 4 extremities. Psychiatric: Normal mood and affect.  Laboratory Data: Lab Results  Component Value Date   WBC 7.7 01/20/2016   HGB 12.1 (L) 01/20/2016   HCT 36.3 (L) 01/20/2016   MCV 94.2 01/20/2016   PLT 275 01/20/2016    Lab Results  Component Value Date   CREATININE 0.75 (L) 09/08/2016    Lab Results  Component Value Date   PSA1 <0.1 04/06/2017   PSA1 <0.1 10/07/2016    No results found for: TESTOSTERONE  No results found for: HGBA1C  Urinalysis No results found for: SPECGRAV, PHUR, COLORU, APPEARANCEUR, LEUKOCYTESUR, PROTEINUR, GLUCOSEU, KETONESU, RBCU, BILIRUBINUR, UUROB, NITRITE  No results found for: LABMICR, [place], RBCUA, LABEPIT, MUCUS, BACTERIA  No results found for this or any previous visit. No results found for this or any previous visit. No results found for this or any previous visit. No results found for this or any previous visit. No results found for this or any previous visit. No results found for this or any previous visit. No  results found for this or any previous visit. No results found for this or any previous visit.  Assessment & Plan:   Prostate cancer - PSA remains undetectable.   Frequency - discussed BT, PT and OAB meds. He said he's always voided frequently.   There are no diagnoses linked to this encounter.  No Follow-up on file.  Festus Aloe, MD  [facility] [address], [place], [place] [address] [phone]

## 2017-05-17 ENCOUNTER — Ambulatory Visit: Payer: Medicare Other

## 2017-06-07 ENCOUNTER — Telehealth: Payer: Self-pay

## 2017-06-07 NOTE — Telephone Encounter (Signed)
He needs an apt

## 2017-06-07 NOTE — Telephone Encounter (Signed)
Received call from pharmacy.  Pharmacist states that they cannot honor a Tramadol prescription for #180 tablets which is a 6 month supply.  They can only do 30 days per prescription.  Will notify secretary that his med refill date will need to be changed.  Just letting you know.  Thank you

## 2017-06-07 NOTE — Telephone Encounter (Signed)
Please reschedule patients med refill appointment.  He was given a 6 month supply of Tramadol but the pharmacy will only fill 30 days of the script.

## 2017-06-10 NOTE — Telephone Encounter (Signed)
Please schedule for a med refill appointment by the end of the month.

## 2017-06-30 ENCOUNTER — Encounter: Payer: Self-pay | Admitting: Nurse Practitioner

## 2017-06-30 ENCOUNTER — Ambulatory Visit: Payer: Medicare Other | Attending: Nurse Practitioner | Admitting: Nurse Practitioner

## 2017-06-30 VITALS — BP 107/62 | HR 64 | Temp 98.1°F | Resp 16 | Ht 66.0 in | Wt 215.0 lb

## 2017-06-30 DIAGNOSIS — G894 Chronic pain syndrome: Secondary | ICD-10-CM | POA: Insufficient documentation

## 2017-06-30 DIAGNOSIS — M48062 Spinal stenosis, lumbar region with neurogenic claudication: Secondary | ICD-10-CM | POA: Diagnosis not present

## 2017-06-30 DIAGNOSIS — Z794 Long term (current) use of insulin: Secondary | ICD-10-CM | POA: Diagnosis not present

## 2017-06-30 DIAGNOSIS — M4802 Spinal stenosis, cervical region: Secondary | ICD-10-CM | POA: Insufficient documentation

## 2017-06-30 DIAGNOSIS — E1122 Type 2 diabetes mellitus with diabetic chronic kidney disease: Secondary | ICD-10-CM | POA: Diagnosis not present

## 2017-06-30 DIAGNOSIS — Z5181 Encounter for therapeutic drug level monitoring: Secondary | ICD-10-CM | POA: Diagnosis present

## 2017-06-30 DIAGNOSIS — M5442 Lumbago with sciatica, left side: Secondary | ICD-10-CM | POA: Diagnosis not present

## 2017-06-30 DIAGNOSIS — M170 Bilateral primary osteoarthritis of knee: Secondary | ICD-10-CM | POA: Insufficient documentation

## 2017-06-30 DIAGNOSIS — Z79891 Long term (current) use of opiate analgesic: Secondary | ICD-10-CM | POA: Insufficient documentation

## 2017-06-30 DIAGNOSIS — M5441 Lumbago with sciatica, right side: Secondary | ICD-10-CM | POA: Diagnosis not present

## 2017-06-30 DIAGNOSIS — I129 Hypertensive chronic kidney disease with stage 1 through stage 4 chronic kidney disease, or unspecified chronic kidney disease: Secondary | ICD-10-CM | POA: Insufficient documentation

## 2017-06-30 DIAGNOSIS — G8929 Other chronic pain: Secondary | ICD-10-CM

## 2017-06-30 DIAGNOSIS — M545 Low back pain: Secondary | ICD-10-CM | POA: Insufficient documentation

## 2017-06-30 DIAGNOSIS — M47816 Spondylosis without myelopathy or radiculopathy, lumbar region: Secondary | ICD-10-CM

## 2017-06-30 DIAGNOSIS — M25561 Pain in right knee: Secondary | ICD-10-CM | POA: Insufficient documentation

## 2017-06-30 DIAGNOSIS — Z79899 Other long term (current) drug therapy: Secondary | ICD-10-CM | POA: Diagnosis not present

## 2017-06-30 DIAGNOSIS — M4726 Other spondylosis with radiculopathy, lumbar region: Secondary | ICD-10-CM | POA: Insufficient documentation

## 2017-06-30 DIAGNOSIS — N181 Chronic kidney disease, stage 1: Secondary | ICD-10-CM | POA: Diagnosis not present

## 2017-06-30 DIAGNOSIS — Z7982 Long term (current) use of aspirin: Secondary | ICD-10-CM | POA: Diagnosis not present

## 2017-06-30 DIAGNOSIS — K219 Gastro-esophageal reflux disease without esophagitis: Secondary | ICD-10-CM | POA: Diagnosis not present

## 2017-06-30 DIAGNOSIS — M25562 Pain in left knee: Secondary | ICD-10-CM | POA: Insufficient documentation

## 2017-06-30 MED ORDER — TRAMADOL HCL 50 MG PO TABS: 50.0000 mg | ORAL_TABLET | Freq: Every day | ORAL | 5 refills | Status: DC | PRN

## 2017-06-30 NOTE — Progress Notes (Signed)
Nursing Pain Medication Assessment:  Safety precautions to be maintained throughout the outpatient stay will include: orient to surroundings, keep bed in low position, maintain call bell within reach at all times, provide assistance with transfer out of bed and ambulation.  Medication Inspection Compliance: Pill count conducted under aseptic conditions, in front of the patient. Neither the pills nor the bottle was removed from the patient's sight at any time. Once count was completed pills were immediately returned to the patient in their original bottle.  Medication #1: Tramadol (Ultram) Pill/Patch Count: 30 of 30 pills remain Pill/Patch Appearance: Markings consistent with prescribed medication Bottle Appearance: Standard pharmacy container. Clearly labeled. Filled Date: 04 / 02 / 2019 Last Medication intake:  unopened bottle  Medication #2: Tramadol (Ultram) Pill/Patch Count: 39 of 60 pills remain Pill/Patch Appearance: Markings consistent with prescribed medication Bottle Appearance: Standard pharmacy container. Clearly labeled. Filled Date: 27 / 6 / 2018 Last Medication intake:  pre exisiting  Medication #3: Tramadol (Ultram) Pill/Patch Count: 8 of 30 pills remain Pill/Patch Appearance: Markings consistent with prescribed medication Bottle Appearance: Standard pharmacy container. Clearly labeled. Filled Date: 02 / 06 / 2019 Last Medication intake:  Yesterday

## 2017-06-30 NOTE — Patient Instructions (Addendum)
____________________________________________________________________________________________  Medication Rules  Applies to: All patients receiving prescriptions (written or electronic).  Pharmacy of record: Pharmacy where electronic prescriptions will be sent. If written prescriptions are taken to a different pharmacy, please inform the nursing staff. The pharmacy listed in the electronic medical record should be the one where you would like electronic prescriptions to be sent.  Prescription refills: Only during scheduled appointments. Applies to both, written and electronic prescriptions.  NOTE: The following applies primarily to controlled substances (Opioid* Pain Medications).   Patient's responsibilities: 1. Pain Pills: Bring all pain pills to every appointment (except for procedure appointments). 2. Pill Bottles: Bring pills in original pharmacy bottle. Always bring newest bottle. Bring bottle, even if empty. 3. Medication refills: You are responsible for knowing and keeping track of what medications you need refilled. The day before your appointment, write a list of all prescriptions that need to be refilled. Bring that list to your appointment and give it to the admitting nurse. Prescriptions will be written only during appointments. If you forget a medication, it will not be "Called in", "Faxed", or "electronically sent". You will need to get another appointment to get these prescribed. 4. Prescription Accuracy: You are responsible for carefully inspecting your prescriptions before leaving our office. Have the discharge nurse carefully go over each prescription with you, before taking them home. Make sure that your name is accurately spelled, that your address is correct. Check the name and dose of your medication to make sure it is accurate. Check the number of pills, and the written instructions to make sure they are clear and accurate. Make sure that you are given enough medication to last  until your next medication refill appointment. 5. Taking Medication: Take medication as prescribed. Never take more pills than instructed. Never take medication more frequently than prescribed. Taking less pills or less frequently is permitted and encouraged, when it comes to controlled substances (written prescriptions).  6. Inform other Doctors: Always inform, all of your healthcare providers, of all the medications you take. 7. Pain Medication from other Providers: You are not allowed to accept any additional pain medication from any other Doctor or Healthcare provider. There are two exceptions to this rule. (see below) In the event that you require additional pain medication, you are responsible for notifying us, as stated below. 8. Medication Agreement: You are responsible for carefully reading and following our Medication Agreement. This must be signed before receiving any prescriptions from our practice. Safely store a copy of your signed Agreement. Violations to the Agreement will result in no further prescriptions. (Additional copies of our Medication Agreement are available upon request.) 9. Laws, Rules, & Regulations: All patients are expected to follow all Federal and State Laws, Statutes, Rules, & Regulations. Ignorance of the Laws does not constitute a valid excuse. The use of any illegal substances is prohibited. 10. Adopted CDC guidelines & recommendations: Target dosing levels will be at or below 60 MME/day. Use of benzodiazepines** is not recommended.  Exceptions: There are only two exceptions to the rule of not receiving pain medications from other Healthcare Providers. 1. Exception #1 (Emergencies): In the event of an emergency (i.e.: accident requiring emergency care), you are allowed to receive additional pain medication. However, you are responsible for: As soon as you are able, call our office [phone], at any time of the day or night, and leave a message stating your name, the  date and nature of the emergency, and the name and dose of the medication   prescribed. In the event that your call is answered by a member of our staff, make sure to document and save the date, time, and the name of the person that took your information.  2. Exception #2 (Planned Surgery): In the event that you are scheduled by another doctor or dentist to have any type of surgery or procedure, you are allowed (for a period no longer than 30 days), to receive additional pain medication, for the acute post-op pain. However, in this case, you are responsible for picking up a copy of our "Post-op Pain Management for Surgeons" handout, and giving it to your surgeon or dentist. This document is available at our office, and does not require an appointment to obtain it. Simply go to our office during business hours (Monday-Thursday from 8:00 AM to 4:00 PM) (Friday 8:00 AM to 12:00 Noon) or if you have a scheduled appointment with us, prior to your surgery, and ask for it by name. In addition, you will need to provide us with your name, name of your surgeon, type of surgery, and date of procedure or surgery.  *Opioid medications include: morphine, codeine, oxycodone, oxymorphone, hydrocodone, hydromorphone, meperidine, tramadol, tapentadol, buprenorphine, fentanyl, methadone. **Benzodiazepine medications include: diazepam (Valium), alprazolam (Xanax), clonazepam (Klonopine), lorazepam (Ativan), clorazepate (Tranxene), chlordiazepoxide (Librium), estazolam (Prosom), oxazepam (Serax), temazepam (Restoril), triazolam (Halcion) (Last updated: 05/06/2017) ____________________________________________________________________________________________    Facet Joint Block The facet joints connect the bones of the spine (vertebrae). They make it possible for you to bend, twist, and make other movements with your spine. They also keep you from bending too far, twisting too far, and making other excessive movements. A facet  joint block is a procedure where a numbing medicine (anesthetic) is injected into a facet joint. Often, a type of anti-inflammatory medicine called a steroid is also injected. A facet joint block may be done to diagnose neck or back pain. If the pain gets better after a facet joint block, it means the pain is probably coming from the facet joint. If the pain does not get better, it means the pain is probably not coming from the facet joint. A facet joint block may also be done to relieve neck or back pain caused by an inflamed facet joint. A facet joint block is only done to relieve pain if the pain does not improve with other methods, such as medicine, exercise programs, and physical therapy. Tell a health care provider about:  Any allergies you have.  All medicines you are taking, including vitamins, herbs, eye drops, creams, and over-the-counter medicines.  Any problems you or family members have had with anesthetic medicines.  Any blood disorders you have.  Any surgeries you have had.  Any medical conditions you have.  Whether you are pregnant or may be pregnant. What are the risks? Generally, this is a safe procedure. However, problems may occur, including:  Bleeding.  Injury to a nerve near the injection site.  Pain at the injection site.  Weakness or numbness in areas controlled by nerves near the injection site.  Infection.  Temporary fluid retention.  Allergic reactions to medicines or dyes.  Injury to other structures or organs near the injection site.  What happens before the procedure?  Follow instructions from your health care provider about eating or drinking restrictions.  Ask your health care provider about: ? Changing or stopping your regular medicines. This is especially important if you are taking diabetes medicines or blood thinners. ? Taking medicines such as aspirin and ibuprofen. These   medicines can thin your blood. Do not take these medicines before  your procedure if your health care provider instructs you not to.  Do not take any new dietary supplements or medicines without asking your health care provider first.  Plan to have someone take you home after the procedure. What happens during the procedure?  You may need to remove your clothing and dress in an open-back gown.  The procedure will be done while you are lying on an X-ray table. You will most likely be asked to lie on your stomach, but you may be asked to lie in a different position if an injection will be made in your neck.  Machines will be used to monitor your oxygen levels, heart rate, and blood pressure.  If an injection will be made in your neck, an IV tube will be inserted into one of your veins. Fluids and medicine will flow directly into your body through the IV tube.  The area over the facet joint where the injection will be made will be cleaned with soap. The surrounding skin will be covered with clean drapes.  A numbing medicine (local anesthetic) will be applied to your skin. Your skin may sting or burn for a moment.  A video X-ray machine (fluoroscopy) will be used to locate the joint. In some cases, a CT scan may be used.  A contrast dye may be injected into the facet joint area to help locate the joint.  When the joint is located, an anesthetic will be injected into the joint through the needle.  Your health care provider will ask you whether you feel pain relief. If you do feel relief, a steroid may be injected to provide pain relief for a longer period of time. If you do not feel relief or feel only partial relief, additional injections of an anesthetic may be made in other facet joints.  The needle will be removed.  Your skin will be cleaned.  A bandage (dressing) will be applied over each injection site. The procedure may vary among health care providers and hospitals. What happens after the procedure?  You will be observed for 15-30 minutes  before being allowed to go home. This information is not intended to replace advice given to you by your health care provider. Make sure you discuss any questions you have with your health care provider. Document Released: 07/15/2006 Document Revised: 03/27/2015 Document Reviewed: 11/19/2014 Elsevier Interactive Patient Education  2018 [employer]  

## 2017-06-30 NOTE — Progress Notes (Signed)
Patient's Name: Joshua Humphrey  MRN: [id number]  Referring Provider: Kirk Ruths, MD  DOB: 04-09-1929  PCP: Kirk Ruths, MD  DOS: 06/30/2017  Note by: Vevelyn Francois NP  Service setting: Ambulatory outpatient  Specialty: Interventional Pain Management  Location: [facility] (AMB) Pain Management Facility    Patient type: Established    Primary Reason(s) for Visit: Encounter for prescription drug management. (Level of risk: moderate)  CC: Knee Pain (bilateral) and Back Pain (lower bilateral)  HPI  [name] is a 84 y.o. year old, male patient, who comes today for a medication management evaluation. He has Lipoma of neck; Allergic rhinitis; Anemia, unspecified; Asthma; Cataract; Cortical senile cataract; GERD (gastroesophageal reflux disease); Health care maintenance; Hyperlipidemia, unspecified; Hypertension; Mass of subcutaneous tissue of back; Obesity, unspecified; Obstructive sleep apnea; Post-operative state; Prostate cancer ([place]); Restless leg syndrome; Scoliosis (and kyphoscoliosis), idiopathic; Lumbar central spinal stenosis (Severe:L4-5; Mild:L3-4; Moderate:L2-3); Type 2 diabetes mellitus with stage 1 chronic kidney disease ([place]); Chronic pain syndrome; Long term (current) use of opiate analgesic; Long term prescription opiate use; Opiate use (30 MME/Day); Chronic low back pain (Primary Area of Pain) (Bilateral) (R>L); Chronic lower extremity pain (Tertiary source of pain) (Bilateral) (L>R); Abnormal MRI, lumbar spine; Abnormal MRI, cervical spine; Lumbar spinal stenosis with neurogenic claudication; Disturbance of skin sensation; Chronic hip pain (Secondary Area of Pain) (Left); Chronic knee pain (Bilateral) (L>R); Generalized weakness; Muscle weakness (generalized); Chronic Lumbar radicular pain (Left) (L5); Neurogenic pain; Osteoarthritis; Lumbar foraminal stenosis (Right: L4-5 and L5-S1) (Left: L2-3, L3-4, and L4-5); Osteoarthritis of hip (Left); Chronic hip arthralgia (Left); Chronic  arthralgias of knees and hips (Left); Vitamin D insufficiency; Cervical spinal stenosis; Cervical foraminal stenosis; Lumbar facet syndrome (Bilateral); Osteoarthritis of lumbar spine; Osteoarthritis of knee (Bilateral); Abnormal knee x-ray (2017); Lumbar facet joint osteoarthritis (Bilateral); Ataxia; and Lumbar spondylosis on their problem list. His primarily concern today is the Knee Pain (bilateral) and Back Pain (lower bilateral)  Pain Assessment: Location: Lower, Left, Right(bilateral) Back(knees) Radiating: na Onset: More than a month ago Duration: Chronic pain Quality: Discomfort, Aching Severity: 4 /10 (self-reported pain score)  Note: Reported level is compatible with observation.                          Effect on ADL: difficult to get comfortable with back pain.  pain severity is dependant upon activity Timing: Constant Modifying factors: medications  Mr. Halsted was last scheduled for an appointment on Visit date not found for medication management. During today's appointment we reviewed [name] chronic pain status, as well as his outpatient medication regimen. He is in today early for his medication management appointment secondary to a problem with the pharmacy. He admits that he continues to have pain but he denies any leg pain. He denies numbness and tingling. He admits that he had an injection in the past that was effective and he would like to have another. He denies any side effects of his medication.  The patient  reports that he does not use drugs. His body mass index is 34.7 kg/m.  Further details on both, my assessment(s), as well as the proposed treatment plan, please see below.  Controlled Substance Pharmacotherapy Assessment REMS (Risk Evaluation and Mitigation Strategy)  Analgesic: Tramadol 39m qd  MME/day:50 mg/day.  PJanett Billow RN  06/30/2017 11:14 AM  Sign at close encounter Nursing Pain Medication Assessment:  Safety precautions to be maintained  throughout the outpatient stay will include: orient  to surroundings, keep bed in low position, maintain call bell within reach at all times, provide assistance with transfer out of bed and ambulation.  Medication Inspection Compliance: Pill count conducted under aseptic conditions, in front of the patient. Neither the pills nor the bottle was removed from the patient's sight at any time. Once count was completed pills were immediately returned to the patient in their original bottle.  Medication #1: Tramadol (Ultram) Pill/Patch Count: 30 of 30 pills remain Pill/Patch Appearance: Markings consistent with prescribed medication Bottle Appearance: Standard pharmacy container. Clearly labeled. Filled Date: 04 / 02 / 2019 Last Medication intake:  unopened bottle  Medication #2: Tramadol (Ultram) Pill/Patch Count: 39 of 60 pills remain Pill/Patch Appearance: Markings consistent with prescribed medication Bottle Appearance: Standard pharmacy container. Clearly labeled. Filled Date: 33 / 6 / 2018 Last Medication intake:  pre exisiting  Medication #3: Tramadol (Ultram) Pill/Patch Count: 8 of 30 pills remain Pill/Patch Appearance: Markings consistent with prescribed medication Bottle Appearance: Standard pharmacy container. Clearly labeled. Filled Date: 02 / 06 / 2019 Last Medication intake:  Yesterday   Pharmacokinetics: Liberation and absorption (onset of action): WNL Distribution (time to peak effect): WNL Metabolism and excretion (duration of action): WNL         Pharmacodynamics: Desired effects: Analgesia: Mr. Steinhoff reports >50% benefit. Functional ability: Patient reports that medication allows him to accomplish basic ADLs Clinically meaningful improvement in function (CMIF): Sustained CMIF goals met Perceived effectiveness: Described as relatively effective, allowing for increase in activities of daily living (ADL) Undesirable effects: Side-effects or Adverse reactions: None  reported Monitoring: [place] PMP: Online review of the past 40-monthperiod conducted. Compliant with practice rules and regulations Last UDS on record: Summary  Date Value Ref Range Status  09/08/2016 FINAL  Final    Comment:    ==================================================================== TOXASSURE COMP DRUG ANALYSIS,UR ==================================================================== Test                             Result       Flag       Units Drug Present and Declared for Prescription Verification   Gabapentin                     PRESENT      EXPECTED   Acetaminophen                  PRESENT      EXPECTED Drug Absent but Declared for Prescription Verification   Hydrocodone                    Not Detected UNEXPECTED ng/mg creat   Tramadol                       Not Detected UNEXPECTED   Salicylate                     Not Detected UNEXPECTED    Aspirin, as indicated in the declared medication list, is not    always detected even when used as directed. ==================================================================== Test                      Result    Flag   Units      Ref Range   Creatinine              23  mg/dL      >=20 ==================================================================== Declared Medications:  The flagging and interpretation on this report are based on the  following declared medications.  Unexpected results may arise from  inaccuracies in the declared medications.  **Note: The testing scope of this panel includes these medications:  Gabapentin  Hydrocodone (Norco)  Tramadol (Ultram)  **Note: The testing scope of this panel does not include small to  moderate amounts of these reported medications:  Acetaminophen (Norco)  Acetaminophen (Tylenol)  Aspirin (Aspirin 81)  **Note: The testing scope of this panel does not include following  reported medications:  Albuterol (Combivent)  Atorvastatin (Lipitor)  Calcium Carbonate (Calcium  carbonate/Vitamin D)  Carvedilol (Coreg)  Cephalexin (Keflex)  Cetirizine (Zyrtec)  Chondroitin (Glucosamine-Chondroitin)  Ezetimibe (Vytorin)  Fluticasone (Flonase)  Formoterol (Dulera)  Furosemide (Lasix)  Glucosamine (Glucosamine-Chondroitin)  Hydralazine  Ipratropium (Combivent)  Iron  Leuprolide Acetate (Lupron)  Losartan (Cozaar)  Meloxicam (Mobic)  Metformin (Glucophage)  Mometasone (Dulera)  Montelukast (Singulair)  Multivitamin  Nifedipine  Omeprazole  Potassium  Pramipexole (Mirapex)  Simvastatin (Vytorin)  Sitagliptin (Januvia)  Supplement  Vitamin D (Calcium carbonate/Vitamin D) ==================================================================== For clinical consultation, please call [phone]. ====================================================================    UDS interpretation: Compliant          Medication Assessment Form: Reviewed. Patient indicates being compliant with therapy Treatment compliance: Compliant Risk Assessment Profile: Aberrant behavior: See prior evaluations. None observed or detected today Comorbid factors increasing risk of overdose: See prior notes. No additional risks detected today Risk of substance use disorder (SUD): Low  ORT Scoring interpretation table:  Score <3 = Low Risk for SUD  Score between 4-7 = Moderate Risk for SUD  Score >8 = High Risk for Opioid Abuse   Risk Mitigation Strategies:  Patient Counseling: Covered Patient-Prescriber Agreement (PPA): Present and active  Notification to other healthcare providers: Done  Pharmacologic Plan: No change in therapy, at this time.             Laboratory Chemistry  Inflammation Markers (CRP: Acute Phase) (ESR: Chronic Phase) Lab Results  Component Value Date   CRP 0.7 09/08/2016   ESRSEDRATE 16 09/08/2016                         Rheumatology Markers No results found for: RF, ANA, [facility], [id number], [facility]                      Renal  Function Markers Lab Results  Component Value Date   BUN 20 09/08/2016   CREATININE 0.75 (L) 09/08/2016   GFRAA 97 09/08/2016   GFRNONAA 84 09/08/2016                              Hepatic Function Markers Lab Results  Component Value Date   AST 17 09/08/2016   ALT 15 09/08/2016   ALBUMIN 4.2 09/08/2016   ALKPHOS 64 09/08/2016                        Electrolytes Lab Results  Component Value Date   NA 144 09/08/2016   K 3.8 09/08/2016   CL 105 09/08/2016   CALCIUM 9.2 09/08/2016   MG 1.8 09/08/2016                        Neuropathy Markers Lab Results  Component Value Date  NOBSJGGE36 264 09/08/2016                        Bone Pathology Markers Lab Results  Component Value Date   25OHVITD1 41 09/08/2016   25OHVITD2 <1.0 09/08/2016   25OHVITD3 41 09/08/2016                         Coagulation Parameters Lab Results  Component Value Date   PLT 275 01/20/2016                        Cardiovascular Markers Lab Results  Component Value Date   HGB 12.1 (L) 01/20/2016   HCT 36.3 (L) 01/20/2016                         CA Markers No results found for: CEA, CA125, LABCA2                      Note: Lab results reviewed.  Recent Diagnostic Imaging Results  DG C-Arm 1-60 Min-No Report Fluoroscopy was utilized by the requesting physician.  No radiographic  interpretation.   Complexity Note: Imaging results reviewed. Results shared with Mr. Livermore, using Layman's terms.                         Meds   Current Outpatient Medications:  .  acetaminophen (TYLENOL) 650 MG CR tablet, Take 650-1,300 mg by mouth every 8 (eight) hours as needed (for pain.). , Disp: , Rfl:  .  aspirin EC 81 MG tablet, Take 81 mg by mouth daily. , Disp: , Rfl:  .  atorvastatin (LIPITOR) 40 MG tablet, Take 40 mg by mouth daily. , Disp: , Rfl:  .  B-D ULTRAFINE III SHORT PEN 31G X 8 MM MISC, USE BID UTD, Disp: , Rfl: 11 .  Calcium Carbonate-Vitamin D (CALTRATE 600+D PO), Take 2 tablets by  mouth daily., Disp: , Rfl:  .  carvedilol (COREG) 25 MG tablet, TAKE 1 TABLET (25 MG) TWICE DAILY, Disp: , Rfl:  .  cetirizine (ZYRTEC) 10 MG tablet, Take 10 mg by mouth every evening. , Disp: , Rfl:  .  ferrous fumarate (HEMOCYTE - 106 MG FE) 325 (106 FE) MG TABS tablet, Take 1 tablet by mouth 2 (two) times daily. , Disp: , Rfl:  .  FLAXSEED, LINSEED, PO, Take by mouth daily., Disp: , Rfl:  .  fluticasone (FLONASE) 50 MCG/ACT nasal spray, Place 1-2 sprays into both nostrils daily as needed (for allergies.). , Disp: , Rfl:  .  furosemide (LASIX) 40 MG tablet, TAKE 1 TABLET BY MOUTH TWICE DAILY, Disp: , Rfl:  .  Glucosamine-Chondroit-Vit C-Mn (GLUCOSAMINE-CHONDROITIN) CAPS, Take 2 capsules by mouth daily., Disp: , Rfl:  .  hydrALAZINE (APRESOLINE) 50 MG tablet, Take 50 mg by mouth 3 (three) times daily. , Disp: , Rfl:  .  Ipratropium-Albuterol (COMBIVENT RESPIMAT) 20-100 MCG/ACT AERS respimat, INHALE 2 PUFF FOUR TIMES DAILY AS NEEDED FOR SHORTNESS OF BREATH AND/OR WHEEZING., Disp: , Rfl:  .  LEVEMIR FLEXTOUCH 100 UNIT/ML Pen, Inject 15 Units into the skin every evening. , Disp: , Rfl:  .  losartan (COZAAR) 100 MG tablet, Take 100 mg by mouth daily. , Disp: , Rfl:  .  magnesium oxide (MAG-OX) 400 MG tablet, Take 400 mg by mouth daily., Disp: , Rfl:  .  metFORMIN (GLUCOPHAGE) 850 MG tablet, Take 850 mg by mouth 2 (two) times daily. , Disp: , Rfl:  .  mometasone-formoterol (DULERA) 200-5 MCG/ACT AERO, Inhale 2 puffs into the lungs 2 (two) times daily. , Disp: , Rfl:  .  montelukast (SINGULAIR) 10 MG tablet, Take 10 mg by mouth at bedtime., Disp: , Rfl:  .  Multiple Vitamins-Minerals (OCUVITE ADULT 50+) CAPS, Take 1 capsule by mouth daily. , Disp: , Rfl:  .  NIFEdipine (PROCARDIA XL/ADALAT-CC) 90 MG 24 hr tablet, Take 90 mg by mouth daily. , Disp: , Rfl:  .  omeprazole (PRILOSEC) 20 MG capsule, Take 20 mg by mouth daily., Disp: , Rfl:  .  potassium chloride SA (K-DUR,KLOR-CON) 20 MEQ tablet, TAKE 1  TABLET(20 MEQ) BY MOUTH TWICE DAILY, Disp: , Rfl:  .  pramipexole (MIRAPEX) 0.25 MG tablet, Take 0.25 mg by mouth at bedtime as needed (for restless leg syndrome). , Disp: , Rfl:  .  sitaGLIPtin (JANUVIA) 100 MG tablet, Take 100 mg by mouth daily., Disp: , Rfl:  .  [START ON 08/27/2017] traMADol (ULTRAM) 50 MG tablet, Take 1 tablet (50 mg total) by mouth daily as needed for severe pain., Disp: 30 tablet, Rfl: 5 .  benzonatate (TESSALON) 200 MG capsule, Take 200 mg by mouth as needed., Disp: , Rfl: 0 .  gabapentin (NEURONTIN) 300 MG capsule, Take 1-3 capsules (300-900 mg total) by mouth 4 (four) times daily. Follow titration schedule., Disp: 360 capsule, Rfl: 5  ROS  Constitutional: Denies any fever or chills Gastrointestinal: No reported hemesis, hematochezia, vomiting, or acute GI distress Musculoskeletal: Denies any acute onset joint swelling, redness, loss of ROM, or weakness Neurological: No reported episodes of acute onset apraxia, aphasia, dysarthria, agnosia, amnesia, paralysis, loss of coordination, or loss of consciousness  Allergies  Mr. Lotts has No Known Allergies.  [place]  Drug: Mr. Drumwright  reports that he does not use drugs. Alcohol:  reports that he drinks alcohol. Tobacco:  reports that he has never smoked. He has never used smokeless tobacco. Medical:  has a past medical history of Anemia, Asthma, Cancer ([place]), Cancer ([place]), Diabetes mellitus without complication ([place]), GERD (gastroesophageal reflux disease), Hypertension, PONV (postoperative nausea and vomiting), Septic olecranon bursitis of left elbow (01/22/2016), and Sleep apnea. Surgical: Mr. Moan  has a past surgical history that includes tumor removed (2000); Prostate surgery (2002); lipoma removal (2000); Tonsillectomy; Cataract extraction, bilateral; Toe Surgery; Olecranon bursectomy (Left, 01/21/2016); Elbow surgery; and Eye surgery. Family: family history includes Alcohol abuse in his father; Cancer in his brother and  mother.  Constitutional Exam  General appearance: Well nourished, well developed, and well hydrated. In no apparent acute distress Vitals:   06/30/17 1103  BP: 107/62  Pulse: 64  Resp: 16  Temp: 98.1 F (36.7 C)  TempSrc: Oral  SpO2: 97%  Weight: 215 lb (97.5 kg)  Height: _0  (1.676 m)   BMI Assessment: Estimated body mass index is 34.7 kg/m as calculated from the following:   Height as of this encounter: _1  (1.676 m).   Weight as of this encounter: 215 lb (97.5 kg).  BMI interpretation table: BMI level Category Range association with higher incidence of chronic pain  <18 kg/m2 Underweight   18.5-24.9 kg/m2 Ideal body weight   25-29.9 kg/m2 Overweight Increased incidence by 20%  30-34.9 kg/m2 Obese (Class I) Increased incidence by 68%  35-39.9 kg/m2 Severe obesity (Class II) Increased incidence by 136%  >40 kg/m2 Extreme obesity (Class III) Increased incidence by  254%   Patient's current BMI Ideal Body weight  Body mass index is 34.7 kg/m. Ideal body weight: 63.8 kg (140 lb 10.5 oz) Adjusted ideal body weight: 77.3 kg (170 lb 6.3 oz)   BMI Readings from Last 4 Encounters:  06/30/17 34.70 kg/m  04/30/17 37.12 kg/m  04/26/17 33.97 kg/m  04/08/17 33.97 kg/m   Wt Readings from Last 4 Encounters:  06/30/17 215 lb (97.5 kg)  04/30/17 230 lb (104.3 kg)  04/26/17 230 lb (104.3 kg)  04/08/17 230 lb (104.3 kg)  Psych/Mental status: Alert, oriented x 3 (person, place, & time)       Eyes: PERLA Respiratory: No evidence of acute respiratory distress   Lumbar Spine Area Exam  Skin & Axial Inspection: No masses, redness, or swelling Alignment: Symmetrical Functional ROM: Unrestricted ROM       Stability: No instability detected Muscle Tone/Strength: Functionally intact. No obvious neuro-muscular anomalies detected. Sensory (Neurological): Unimpaired Palpation: Tender       Provocative Tests: Lumbar Hyperextension and rotation test: evaluation deferred today         Lumbar Lateral bending test: evaluation deferred today       Patrick's Maneuver: evaluation deferred today                    Gait & Posture Assessment  Ambulation: Patient ambulates using a walker Gait: Relatively normal for age and body habitus Posture: Antalgic   Lower Extremity Exam    Side: Right lower extremity  Side: Left lower extremity  Skin & Extremity Inspection: Skin color, temperature, and hair growth are WNL. No peripheral edema or cyanosis. No masses, redness, swelling, asymmetry, or associated skin lesions. No contractures.  Skin & Extremity Inspection: Skin color, temperature, and hair growth are WNL. No peripheral edema or cyanosis. No masses, redness, swelling, asymmetry, or associated skin lesions. No contractures.  Functional ROM: Unrestricted ROM          Functional ROM: Unrestricted ROM          Muscle Tone/Strength: Functionally intact. No obvious neuro-muscular anomalies detected.  Muscle Tone/Strength: Functionally intact. No obvious neuro-muscular anomalies detected.  Sensory (Neurological): Unimpaired  Sensory (Neurological): Unimpaired  Palpation: No palpable anomalies  Palpation: No palpable anomalies   Assessment  Primary Diagnosis & Pertinent Problem List: The primary encounter diagnosis was Lumbar spondylosis. Diagnoses of Chronic low back pain (Primary Area of Pain) (Bilateral) (R>L), Lumbar facet syndrome (Bilateral), and Chronic pain syndrome were also pertinent to this visit.  Status Diagnosis  Persistent Persistent Persistent 1. Lumbar spondylosis   2. Chronic low back pain (Primary Area of Pain) (Bilateral) (R>L)   3. Lumbar facet syndrome (Bilateral)   4. Chronic pain syndrome     Problems updated and reviewed during this visit: No problems updated. Plan of Care  Pharmacotherapy (Medications Ordered): Meds ordered this encounter  Medications  . traMADol (ULTRAM) 50 MG tablet    Sig: Take 1 tablet (50 mg total) by mouth daily as needed for  severe pain.    Dispense:  30 tablet    Refill:  5    Do not place this medication, or any other prescription from our practice, on "Automatic Refill". Patient may have prescription filled one day early if pharmacy is closed on scheduled refill date. Do not fill until: 08/27/2017 To last until: 02/23/2018    Order Specific Question:   Supervising Provider    Answer:   Milinda Pointer [phone]   New Prescriptions   No medications on  file   Medications administered today: Taedyn Glasscock. Streett had no medications administered during this visit. Lab-work, procedure(s), and/or referral(s): Orders Placed This Encounter  Procedures  . LUMBAR FACET(MEDIAL BRANCH NERVE BLOCK) MBNB   Imaging and/or referral(s): None  Interventional management options: Planned, scheduled, and/or pending:   Diagnostic bilateral lumbar facet block   Considering:   Diagnostic left intra-articular hip joint injection Possible diagnostic left femoral nerve + obturator nerve block Possible left femoral nerve + obturator nerve RFA Diagnostic left L2-3 interlaminar lumbar epiduralsteroid injection Diagnostic left L3-4 interlaminar lumbar epiduralsteroid injection Diagnosticleft L4-5 interlaminar lumbar epiduralsteroid injection Diagnosticleft L2-3, L3-4 and L4-5 transforaminal epiduralsteroid injection  Diagnostic right L4-5 and L5-S1 transforaminal epiduralsteroid injection  Diagnostic bilateral lumbar facet block Possible bilateral lumbar facet RFA Diagnostic bilateral intra-articular knee joint injection with local anesthetic and steroids Possible series of 5 bilateral intra-articular Hyalgan knee injections Diagnostic bilateral genicular nerve block Possible bilateral genicular nerve RFA    Palliative PRN treatment(s):   Palliative/Diagnostic bilateral lumbar facet block#2  Palliativeleft intra-articular hip joint injection Diagnostic left L2-3 interlaminar lumbar epiduralsteroid  injection Diagnostic left L3-4 interlaminar lumbar epiduralsteroid injection Diagnosticleft L4-5 interlaminar lumbar epiduralsteroid injection Diagnosticleft L2-3, L3-4 and L4-5 transforaminal epiduralsteroid injection  Diagnostic right L4-5 and L5-S1 transforaminal epiduralsteroid injection    Provider-requested follow-up: Return in about 6 months (around 12/30/2017) for MedMgmt with Me Dionisio David), w/ Dr. Dossie Arbour, Procedure(NS), (ASAA).  Future Appointments  Date Time Provider [place]  07/15/2017  9:45 AM Milinda Pointer, MD [facility] None  12/23/2017 11:00 AM Vevelyn Francois, NP [facility] None  05/02/2018  2:15 PM Stoioff, Ronda Fairly, MD [facility] None   Primary Care Physician: Kirk Ruths, MD Location: [facility] Outpatient Pain Management Facility Note by: Vevelyn Francois NP Date: 06/30/2017; Time: 8:34 AM  Pain Score Disclaimer: We use the NRS-11 scale. This is a self-reported, subjective measurement of pain severity with only modest accuracy. It is used primarily to identify changes within a particular patient. It must be understood that outpatient pain scales are significantly less accurate that those used for research, where they can be applied under ideal controlled circumstances with minimal exposure to variables. In reality, the score is likely to be a combination of pain intensity and pain affect, where pain affect describes the degree of emotional arousal or changes in action readiness caused by the sensory experience of pain. Factors such as social and work situation, setting, emotional state, anxiety levels, expectation, and prior pain experience may influence pain perception and show large inter-individual differences that may also be affected by time variables.  Patient instructions provided during this appointment: Patient Instructions  ____________________________________________________________________________________________  Medication  Rules  Applies to: All patients receiving prescriptions (written or electronic).  Pharmacy of record: Pharmacy where electronic prescriptions will be sent. If written prescriptions are taken to a different pharmacy, please inform the nursing staff. The pharmacy listed in the electronic medical record should be the one where you would like electronic prescriptions to be sent.  Prescription refills: Only during scheduled appointments. Applies to both, written and electronic prescriptions.  NOTE: The following applies primarily to controlled substances (Opioid* Pain Medications).   Patient's responsibilities: 1. Pain Pills: Bring all pain pills to every appointment (except for procedure appointments). 2. Pill Bottles: Bring pills in original pharmacy bottle. Always bring newest bottle. Bring bottle, even if empty. 3. Medication refills: You are responsible for knowing and keeping track of what medications you need refilled. The day before your appointment, write a list  of all prescriptions that need to be refilled. Bring that list to your appointment and give it to the admitting nurse. Prescriptions will be written only during appointments. If you forget a medication, it will not be "Called in", "Faxed", or "electronically sent". You will need to get another appointment to get these prescribed. 4. Prescription Accuracy: You are responsible for carefully inspecting your prescriptions before leaving our office. Have the discharge nurse carefully go over each prescription with you, before taking them home. Make sure that your name is accurately spelled, that your address is correct. Check the name and dose of your medication to make sure it is accurate. Check the number of pills, and the written instructions to make sure they are clear and accurate. Make sure that you are given enough medication to last until your next medication refill appointment. 5. Taking Medication: Take medication as prescribed. Never  take more pills than instructed. Never take medication more frequently than prescribed. Taking less pills or less frequently is permitted and encouraged, when it comes to controlled substances (written prescriptions).  6. Inform other Doctors: Always inform, all of your healthcare providers, of all the medications you take. 7. Pain Medication from other Providers: You are not allowed to accept any additional pain medication from any other Doctor or Healthcare provider. There are two exceptions to this rule. (see below) In the event that you require additional pain medication, you are responsible for notifying us, as stated below. 8. Medication Agreement: You are responsible for carefully reading and following our Medication Agreement. This must be signed before receiving any prescriptions from our practice. Safely store a copy of your signed Agreement. Violations to the Agreement will result in no further prescriptions. (Additional copies of our Medication Agreement are available upon request.) 9. Laws, Rules, & Regulations: All patients are expected to follow all Federal and [employer], [employer], Rules, [facility]. Ignorance of the Laws does not constitute a valid excuse. The use of any illegal substances is prohibited. 10. Adopted CDC guidelines & recommendations: Target dosing levels will be at or below 60 MME/day. Use of benzodiazepines** is not recommended.  Exceptions: There are only two exceptions to the rule of not receiving pain medications from other Healthcare Providers. 1. Exception #1 (Emergencies): In the event of an emergency (i.e.: accident requiring emergency care), you are allowed to receive additional pain medication. However, you are responsible for: As soon as you are able, call our office [phone], at any time of the day or night, and leave a message stating your name, the date and nature of the emergency, and the name and dose of the medication prescribed. In the event that  your call is answered by a member of our staff, make sure to document and save the date, time, and the name of the person that took your information.  2. Exception #2 (Planned Surgery): In the event that you are scheduled by another doctor or dentist to have any type of surgery or procedure, you are allowed (for a period no longer than 30 days), to receive additional pain medication, for the acute post-op pain. However, in this case, you are responsible for picking up a copy of our "Post-op Pain Management for Surgeons" handout, and giving it to your surgeon or dentist. This document is available at our office, and does not require an appointment to obtain it. Simply go to our office during business hours (Monday-Thursday from 8:00 AM to 4:00 PM) (Friday 8:00 AM to 12:00 Noon) or  if you have a scheduled appointment with [place], prior to your surgery, and ask for it by name. In addition, you will need to provide [place] with your name, name of your surgeon, type of surgery, and date of procedure or surgery.  *Opioid medications include: morphine, codeine, oxycodone, oxymorphone, hydrocodone, hydromorphone, meperidine, tramadol, tapentadol, buprenorphine, fentanyl, methadone. **Benzodiazepine medications include: diazepam (Valium), alprazolam (Xanax), clonazepam (Klonopine), lorazepam (Ativan), clorazepate (Tranxene), chlordiazepoxide (Librium), estazolam (Prosom), oxazepam (Serax), temazepam (Restoril), triazolam (Halcion) (Last updated: 05/06/2017) ____________________________________________________________________________________________    Facet Joint Block The facet joints connect the bones of the spine (vertebrae). They make it possible for you to bend, twist, and make other movements with your spine. They also keep you from bending too far, twisting too far, and making other excessive movements. A facet joint block is a procedure where a numbing medicine (anesthetic) is injected into a facet joint. Often, a  type of anti-inflammatory medicine called a steroid is also injected. A facet joint block may be done to diagnose neck or back pain. If the pain gets better after a facet joint block, it means the pain is probably coming from the facet joint. If the pain does not get better, it means the pain is probably not coming from the facet joint. A facet joint block may also be done to relieve neck or back pain caused by an inflamed facet joint. A facet joint block is only done to relieve pain if the pain does not improve with other methods, such as medicine, exercise programs, and physical therapy. Tell a health care provider about:  Any allergies you have.  All medicines you are taking, including vitamins, herbs, eye drops, creams, and over-the-counter medicines.  Any problems you or family members have had with anesthetic medicines.  Any blood disorders you have.  Any surgeries you have had.  Any medical conditions you have.  Whether you are pregnant or may be pregnant. What are the risks? Generally, this is a safe procedure. However, problems may occur, including:  Bleeding.  Injury to a nerve near the injection site.  Pain at the injection site.  Weakness or numbness in areas controlled by nerves near the injection site.  Infection.  Temporary fluid retention.  Allergic reactions to medicines or dyes.  Injury to other structures or organs near the injection site.  What happens before the procedure?  Follow instructions from your health care provider about eating or drinking restrictions.  Ask your health care provider about: ? Changing or stopping your regular medicines. This is especially important if you are taking diabetes medicines or blood thinners. ? Taking medicines such as aspirin and ibuprofen. These medicines can thin your blood. Do not take these medicines before your procedure if your health care provider instructs you not to.  Do not take any new dietary supplements  or medicines without asking your health care provider first.  Plan to have someone take you home after the procedure. What happens during the procedure?  You may need to remove your clothing and dress in an open-back gown.  The procedure will be done while you are lying on an X-ray table. You will most likely be asked to lie on your stomach, but you may be asked to lie in a different position if an injection will be made in your neck.  Machines will be used to monitor your oxygen levels, heart rate, and blood pressure.  If an injection will be made in your neck, an IV tube will be inserted into  one of your veins. Fluids and medicine will flow directly into your body through the IV tube.  The area over the facet joint where the injection will be made will be cleaned with soap. The surrounding skin will be covered with clean drapes.  A numbing medicine (local anesthetic) will be applied to your skin. Your skin may sting or burn for a moment.  A video X-ray machine (fluoroscopy) will be used to locate the joint. In some cases, a CT scan may be used.  A contrast dye may be injected into the facet joint area to help locate the joint.  When the joint is located, an anesthetic will be injected into the joint through the needle.  Your health care provider will ask you whether you feel pain relief. If you do feel relief, a steroid may be injected to provide pain relief for a longer period of time. If you do not feel relief or feel only partial relief, additional injections of an anesthetic may be made in other facet joints.  The needle will be removed.  Your skin will be cleaned.  A bandage (dressing) will be applied over each injection site. The procedure may vary among health care providers and hospitals. What happens after the procedure?  You will be observed for 15-30 minutes before being allowed to go home. This information is not intended to replace advice given to you by your health  care provider. Make sure you discuss any questions you have with your health care provider. Document Released: 07/15/2006 Document Revised: 03/27/2015 Document Reviewed: 11/19/2014 [employer] Interactive Patient Education  [employer].

## 2017-07-15 ENCOUNTER — Ambulatory Visit: Payer: Medicare Other | Admitting: Pain Medicine

## 2017-07-15 DIAGNOSIS — M47817 Spondylosis without myelopathy or radiculopathy, lumbosacral region: Secondary | ICD-10-CM | POA: Insufficient documentation

## 2017-07-15 NOTE — Progress Notes (Deleted)
Patient's Name: Joshua Humphrey  MRN: [id number]  Referring Provider: Kirk Ruths, MD  DOB: 18-Dec-1929  PCP: Kirk Ruths, MD  DOS: 07/15/2017  Note by: Gaspar Cola, MD  Service setting: Ambulatory outpatient  Specialty: Interventional Pain Management  Patient type: Established  Location: [facility] (AMB) Pain Management Facility  Visit type: Interventional Procedure   Primary Reason for Visit: Interventional Pain Management Treatment. CC: No chief complaint on file.  Procedure:       Anesthesia, Analgesia, Anxiolysis:  Type: Lumbar Facet, Medial Branch Block(s) #2  Primary Purpose: Diagnostic Region: Posterolateral Lumbosacral Spine Level: L2, L3, L4, L5, & S1 Medial Branch Level(s). Injecting these levels blocks the L3-4, L4-5, and L5-S1 lumbar facet joints. Laterality: Bilateral  Type: Moderate (Conscious) Sedation combined with Local Anesthesia Indication(s): Analgesia and Anxiety Route: Intravenous (IV) IV Access: Secured Sedation: Meaningful verbal contact was maintained at all times during the procedure  Local Anesthetic: Lidocaine 1-2%   Indications: 1. Spondylosis without myelopathy or radiculopathy, lumbosacral region   2. Lumbar facet syndrome (Bilateral)   3. Lumbar facet joint osteoarthritis (Bilateral)   4. Chronic low back pain (Primary Area of Pain) (Bilateral) (R>L)    Pain Score: Pre-procedure:  /10 Post-procedure:  /10  Pre-op Assessment:  Joshua Humphrey is a 84 y.o. (year old), male patient, seen today for interventional treatment. He  has a past surgical history that includes tumor removed (2000); Prostate surgery (2002); lipoma removal (2000); Tonsillectomy; Cataract extraction, bilateral; Toe Surgery; Olecranon bursectomy (Left, 01/21/2016); Elbow surgery; and Eye surgery. Joshua Humphrey has a current medication list which includes the following prescription(s): acetaminophen, aspirin ec, atorvastatin, b-d ultrafine iii short pen, benzonatate, calcium  carbonate-vitamin d, carvedilol, cetirizine, ferrous fumarate, flaxseed (linseed), fluticasone, furosemide, gabapentin, glucosamine-chondroitin, hydralazine, ipratropium-albuterol, levemir flextouch, losartan, magnesium oxide, metformin, mometasone-formoterol, montelukast, ocuvite adult 50+, nifedipine, omeprazole, potassium chloride sa, pramipexole, sitagliptin, and tramadol. His primarily concern today is the No chief complaint on file.  Initial Vital Signs:  Pulse/HCG Rate:    Temp:   Resp:   BP:   SpO2:    BMI: Estimated body mass index is 34.7 kg/m as calculated from the following:   Height as of 06/30/17: 5\' 6"  (1.676 m).   Weight as of 06/30/17: 215 lb (97.5 kg).  Risk Assessment: Allergies: Reviewed. He has No Known Allergies.  Allergy Precautions: None required Coagulopathies: Reviewed. None identified.  Blood-thinner therapy: None at this time Active Infection(s): Reviewed. None identified. Joshua Humphrey is afebrile  Site Confirmation: Joshua Humphrey was asked to confirm the procedure and laterality before marking the site Procedure checklist: Completed Consent: Before the procedure and under the influence of no sedative(s), amnesic(s), or anxiolytics, the patient was informed of the treatment options, risks and possible complications. To fulfill our ethical and legal obligations, as recommended by the American Medical Association's Code of Ethics, I have informed the patient of my clinical impression; the nature and purpose of the treatment or procedure; the risks, benefits, and possible complications of the intervention; the alternatives, including doing nothing; the risk(s) and benefit(s) of the alternative treatment(s) or procedure(s); and the risk(s) and benefit(s) of doing nothing. The patient was provided information about the general risks and possible complications associated with the procedure. These may include, but are not limited to: failure to achieve desired goals, infection,  bleeding, organ or nerve damage, allergic reactions, paralysis, and death. In addition, the patient was informed of those risks and complications associated to Spine-related procedures, such as failure to decrease pain;  infection (i.e.: Meningitis, epidural or intraspinal abscess); bleeding (i.e.: epidural hematoma, subarachnoid hemorrhage, or any other type of intraspinal or peri-dural bleeding); organ or nerve damage (i.e.: Any type of peripheral nerve, nerve root, or spinal cord injury) with subsequent damage to sensory, motor, and/or autonomic systems, resulting in permanent pain, numbness, and/or weakness of one or several areas of the body; allergic reactions; (i.e.: anaphylactic reaction); and/or death. Furthermore, the patient was informed of those risks and complications associated with the medications. These include, but are not limited to: allergic reactions (i.e.: anaphylactic or anaphylactoid reaction(s)); adrenal axis suppression; blood sugar elevation that in diabetics may result in ketoacidosis or comma; water retention that in patients with history of congestive heart failure may result in shortness of breath, pulmonary edema, and decompensation with resultant heart failure; weight gain; swelling or edema; medication-induced neural toxicity; particulate matter embolism and blood vessel occlusion with resultant organ, and/or nervous system infarction; and/or aseptic necrosis of one or more joints. Finally, the patient was informed that Medicine is not an exact science; therefore, there is also the possibility of unforeseen or unpredictable risks and/or possible complications that may result in a catastrophic outcome. The patient indicated having understood very clearly. We have given the patient no guarantees and we have made no promises. Enough time was given to the patient to ask questions, all of which were answered to the patient's satisfaction. Joshua Humphrey has indicated that he wanted to  continue with the procedure. Attestation: I, the ordering provider, attest that I have discussed with the patient the benefits, risks, side-effects, alternatives, likelihood of achieving goals, and potential problems during recovery for the procedure that I have provided informed consent. Date  Time: {CHL [facility]-PAIN TIME CHOICES:21018001}  Pre-Procedure Preparation:  Monitoring: As per clinic protocol. Respiration, ETCO2, SpO2, BP, heart rate and rhythm monitor placed and checked for adequate function Safety Precautions: Patient was assessed for positional comfort and pressure points before starting the procedure. Time-out: I initiated and conducted the "Time-out" before starting the procedure, as per protocol. The patient was asked to participate by confirming the accuracy of the "Time Out" information. Verification of the correct person, site, and procedure were performed and confirmed by me, the nursing staff, and the patient. "Time-out" conducted as per Joint Commission's Universal Protocol (UP.01.01.01). Time:    Description of Procedure:       Position: Prone Laterality: Bilateral. The procedure was performed in identical fashion on both sides. Levels:  L2, L3, L4, L5, & S1 Medial Branch Level(s) Area Prepped: Posterior Lumbosacral Region Prepping solution: ChloraPrep (2% chlorhexidine gluconate and 70% isopropyl alcohol) Safety Precautions: Aspiration looking for blood return was conducted prior to all injections. At no point did we inject any substances, as a needle was being advanced. Before injecting, the patient was told to immediately notify me if he was experiencing any new onset of "ringing in the ears, or metallic taste in the mouth". No attempts were made at seeking any paresthesias. Safe injection practices and needle disposal techniques used. Medications properly checked for expiration dates. SDV (single dose vial) medications used. After the completion of the procedure, all  disposable equipment used was discarded in the proper designated medical waste containers. Local Anesthesia: Protocol guidelines were followed. The patient was positioned over the fluoroscopy table. The area was prepped in the usual manner. The time-out was completed. The target area was identified using fluoroscopy. A 12-in long, straight, sterile hemostat was used with fluoroscopic guidance to locate the targets for each level blocked.  Once located, the skin was marked with an approved surgical skin marker. Once all sites were marked, the skin (epidermis, dermis, and hypodermis), as well as deeper tissues (fat, connective tissue and muscle) were infiltrated with a small amount of a short-acting local anesthetic, loaded on a 10cc syringe with a 25G, 1.5-in  Needle. An appropriate amount of time was allowed for local anesthetics to take effect before proceeding to the next step. Local Anesthetic: Lidocaine 2.0% The unused portion of the local anesthetic was discarded in the proper designated containers. Technical explanation of process:  L2 Medial Branch Nerve Block (MBB): The target area for the L2 medial branch is at the junction of the postero-lateral aspect of the superior articular process and the superior, posterior, and medial edge of the transverse process of L3. Under fluoroscopic guidance, a Quincke needle was inserted until contact was made with os over the superior postero-lateral aspect of the pedicular shadow (target area). After negative aspiration for blood, 0.5 mL of the nerve block solution was injected without difficulty or complication. The needle was removed intact. L3 Medial Branch Nerve Block (MBB): The target area for the L3 medial branch is at the junction of the postero-lateral aspect of the superior articular process and the superior, posterior, and medial edge of the transverse process of L4. Under fluoroscopic guidance, a Quincke needle was inserted until contact was made with os  over the superior postero-lateral aspect of the pedicular shadow (target area). After negative aspiration for blood, 0.5 mL of the nerve block solution was injected without difficulty or complication. The needle was removed intact. L4 Medial Branch Nerve Block (MBB): The target area for the L4 medial branch is at the junction of the postero-lateral aspect of the superior articular process and the superior, posterior, and medial edge of the transverse process of L5. Under fluoroscopic guidance, a Quincke needle was inserted until contact was made with os over the superior postero-lateral aspect of the pedicular shadow (target area). After negative aspiration for blood, 0.5 mL of the nerve block solution was injected without difficulty or complication. The needle was removed intact. L5 Medial Branch Nerve Block (MBB): The target area for the L5 medial branch is at the junction of the postero-lateral aspect of the superior articular process and the superior, posterior, and medial edge of the sacral ala. Under fluoroscopic guidance, a Quincke needle was inserted until contact was made with os over the superior postero-lateral aspect of the pedicular shadow (target area). After negative aspiration for blood, 0.5 mL of the nerve block solution was injected without difficulty or complication. The needle was removed intact. S1 Medial Branch Nerve Block (MBB): The target area for the S1 medial branch is at the posterior and inferior 6 o'clock position of the L5-S1 facet joint. Under fluoroscopic guidance, the Quincke needle inserted for the L5 MBB was redirected until contact was made with os over the inferior and postero aspect of the sacrum, at the 6 o' clock position under the L5-S1 facet joint (Target area). After negative aspiration for blood, 0.5 mL of the nerve block solution was injected without difficulty or complication. The needle was removed intact. Procedural Needles: 22-gauge, 3.5-inch, Quincke needles used  for all levels. Nerve block solution: 0.2% PF-Ropivacaine + Triamcinolone (40 mg/mL) diluted to a final concentration of 4 mg of Triamcinolone/mL of Ropivacaine The unused portion of the solution was discarded in the proper designated containers.  Once the entire procedure was completed, the treated area was cleaned, making sure to  leave some of the prepping solution back to take advantage of its long term bactericidal properties.   Illustration of the posterior view of the lumbar spine and the posterior neural structures. Laminae of L2 through S1 are labeled. DPRL5, dorsal primary ramus of L5; DPRS1, dorsal primary ramus of S1; DPR3, dorsal primary ramus of L3; FJ, facet (zygapophyseal) joint L3-L4; I, inferior articular process of L4; LB1, lateral branch of dorsal primary ramus of L1; IAB, inferior articular branches from L3 medial branch (supplies L4-L5 facet joint); IBP, intermediate branch plexus; MB3, medial branch of dorsal primary ramus of L3; NR3, third lumbar nerve root; S, superior articular process of L5; SAB, superior articular branches from L4 (supplies L4-5 facet joint also); TP3, transverse process of L3.  There were no vitals filed for this visit.  Start Time:   hrs. End Time:   hrs.  Imaging Guidance (Spinal):  Type of Imaging Technique: Fluoroscopy Guidance (Spinal) Indication(s): Assistance in needle guidance and placement for procedures requiring needle placement in or near specific anatomical locations not easily accessible without such assistance. Exposure Time: Please see nurses notes. Contrast: None used. Fluoroscopic Guidance: I was personally present during the use of fluoroscopy. "Tunnel Vision Technique" used to obtain the best possible view of the target area. Parallax error corrected before commencing the procedure. "Direction-depth-direction" technique used to introduce the needle under continuous pulsed fluoroscopy. Once target was reached, antero-posterior, oblique,  and lateral fluoroscopic projection used confirm needle placement in all planes. Images permanently stored in EMR. Interpretation: No contrast injected. I personally interpreted the imaging intraoperatively. Adequate needle placement confirmed in multiple planes. Permanent images saved into the patient's record.  Antibiotic Prophylaxis:   Anti-infectives (From admission, onward)   None     Indication(s): None identified  Post-operative Assessment:  Post-procedure Vital Signs:  Pulse/HCG Rate:    Temp:   Resp:   BP:   SpO2:    EBL: None  Complications: No immediate post-treatment complications observed by team, or reported by patient.  Note: The patient tolerated the entire procedure well. A repeat set of vitals were taken after the procedure and the patient was kept under observation following institutional policy, for this type of procedure. Post-procedural neurological assessment was performed, showing return to baseline, prior to discharge. The patient was provided with post-procedure discharge instructions, including a section on how to identify potential problems. Should any problems arise concerning this procedure, the patient was given instructions to immediately contact us, at any time, without hesitation. In any case, we plan to contact the patient by telephone for a follow-up status report regarding this interventional procedure.  Comments:  No additional relevant information.  Plan of Care   Imaging Orders  No imaging studies ordered today   Procedure Orders    No procedure(s) ordered today    Medications ordered for procedure: No orders of the defined types were placed in this encounter.  Medications administered: Dontavion [place]. Sief[place] had no medications administered during this visit.  See the medical record for exact dosing, route, and time of administration.  New Prescriptions   No medications on file   Disposition: Discharge home  Discharge Date & Time:  07/15/2017;   hrs.   Physician-requested Follow-up: No follow-ups on file.  Future Appointments  Date Time Provider [place]  07/15/2017  9:45 AM Milinda Pointer, MD [facility] None  12/23/2017 11:00 AM Vevelyn Francois, NP [facility] None  05/02/2018  2:15 PM Stoioff, Ronda Fairly, MD [facility] None   Primary Care  Physician: Kirk Ruths, MD Location: [facility] Note by: Gaspar Cola, MD Date: 07/15/2017; Time: 7:43 AM  Disclaimer:  Medicine is not an Chief Strategy Officer. The only guarantee in medicine is that nothing is guaranteed. It is important to note that the decision to proceed with this intervention was based on the information collected from the patient. The Data and conclusions were drawn from the patient's questionnaire, the interview, and the physical examination. Because the information was provided in large part by the patient, it cannot be guaranteed that it has not been purposely or unconsciously manipulated. Every effort has been made to obtain as much relevant data as possible for this evaluation. It is important to note that the conclusions that lead to this procedure are derived in large part from the available data. Always take into account that the treatment will also be dependent on availability of resources and existing treatment guidelines, considered by other Pain Management Practitioners as being common knowledge and practice, at the time of the intervention. For Medico-Legal purposes, it is also important to point out that variation in procedural techniques and pharmacological choices are the acceptable norm. The indications, contraindications, technique, and results of the above procedure should only be interpreted and judged by a Board-Certified Interventional Pain Specialist with extensive familiarity and expertise in the same exact procedure and technique.

## 2017-08-16 ENCOUNTER — Encounter: Payer: Medicare Other | Admitting: Nurse Practitioner

## 2017-08-23 DIAGNOSIS — J988 Other specified respiratory disorders: Secondary | ICD-10-CM

## 2017-08-23 HISTORY — DX: Other specified respiratory disorders: J98.8

## 2017-09-02 ENCOUNTER — Ambulatory Visit
Admission: RE | Admit: 2017-09-02 | Discharge: 2017-09-02 | Disposition: A | Discharge status: Home / Self Care | Payer: Medicare Other | Source: Ambulatory Visit | Attending: Pain Medicine | Admitting: Pain Medicine

## 2017-09-02 ENCOUNTER — Ambulatory Visit (HOSPITAL_BASED_OUTPATIENT_CLINIC_OR_DEPARTMENT_OTHER): Payer: Medicare Other | Admitting: Pain Medicine

## 2017-09-02 ENCOUNTER — Other Ambulatory Visit: Payer: Self-pay

## 2017-09-02 ENCOUNTER — Encounter: Payer: Self-pay | Admitting: Pain Medicine

## 2017-09-02 VITALS — BP 155/88 | HR 69 | Temp 98.3°F | Resp 18 | Ht 66.0 in | Wt 220.0 lb

## 2017-09-02 DIAGNOSIS — M545 Low back pain: Secondary | ICD-10-CM | POA: Diagnosis present

## 2017-09-02 DIAGNOSIS — M47817 Spondylosis without myelopathy or radiculopathy, lumbosacral region: Secondary | ICD-10-CM | POA: Insufficient documentation

## 2017-09-02 DIAGNOSIS — G8929 Other chronic pain: Secondary | ICD-10-CM | POA: Insufficient documentation

## 2017-09-02 DIAGNOSIS — R29898 Other symptoms and signs involving the musculoskeletal system: Secondary | ICD-10-CM | POA: Insufficient documentation

## 2017-09-02 DIAGNOSIS — M47816 Spondylosis without myelopathy or radiculopathy, lumbar region: Secondary | ICD-10-CM

## 2017-09-02 DIAGNOSIS — M48062 Spinal stenosis, lumbar region with neurogenic claudication: Secondary | ICD-10-CM

## 2017-09-02 DIAGNOSIS — M48061 Spinal stenosis, lumbar region without neurogenic claudication: Secondary | ICD-10-CM

## 2017-09-02 DIAGNOSIS — M5136 Other intervertebral disc degeneration, lumbar region: Secondary | ICD-10-CM | POA: Insufficient documentation

## 2017-09-02 DIAGNOSIS — M792 Neuralgia and neuritis, unspecified: Secondary | ICD-10-CM

## 2017-09-02 DIAGNOSIS — R270 Ataxia, unspecified: Secondary | ICD-10-CM

## 2017-09-02 DIAGNOSIS — M4807 Spinal stenosis, lumbosacral region: Secondary | ICD-10-CM

## 2017-09-02 DIAGNOSIS — M5441 Lumbago with sciatica, right side: Secondary | ICD-10-CM

## 2017-09-02 DIAGNOSIS — G894 Chronic pain syndrome: Secondary | ICD-10-CM

## 2017-09-02 DIAGNOSIS — M5442 Lumbago with sciatica, left side: Secondary | ICD-10-CM

## 2017-09-02 MED ORDER — MIDAZOLAM HCL 5 MG/5ML IJ SOLN: 1.0000 mg | INTRAMUSCULAR | Status: DC | PRN

## 2017-09-02 MED ORDER — LACTATED RINGERS IV SOLN: 1000.0000 mL | Freq: Once | INTRAVENOUS | Status: DC

## 2017-09-02 MED ORDER — ROPIVACAINE HCL 2 MG/ML IJ SOLN
18.0000 mL | Freq: Once | INTRAMUSCULAR | Status: AC
  Administered 2017-09-02: 10 mL via PERINEURAL
  Filled 2017-09-02: qty 20

## 2017-09-02 MED ORDER — TRAMADOL HCL 50 MG PO TABS: 50.0000 mg | ORAL_TABLET | Freq: Every day | ORAL | 5 refills | Status: DC | PRN

## 2017-09-02 MED ORDER — TRIAMCINOLONE ACETONIDE 40 MG/ML IJ SUSP
80.0000 mg | Freq: Once | INTRAMUSCULAR | Status: AC
  Administered 2017-09-02: 40 mg
  Filled 2017-09-02: qty 2

## 2017-09-02 MED ORDER — GABAPENTIN 300 MG PO CAPS: 300.0000 mg | ORAL_CAPSULE | Freq: Four times a day (QID) | ORAL | 5 refills | Status: DC

## 2017-09-02 MED ORDER — FENTANYL CITRATE (PF) 100 MCG/2ML IJ SOLN: 25.0000 ug | INTRAMUSCULAR | Status: DC | PRN

## 2017-09-02 MED ORDER — LIDOCAINE HCL 2 % IJ SOLN
20.0000 mL | Freq: Once | INTRAMUSCULAR | Status: AC
  Administered 2017-09-02: 400 mg
  Filled 2017-09-02: qty 20

## 2017-09-02 NOTE — Patient Instructions (Addendum)
____________________________________________________________________________________________  Post-Procedure Discharge Instructions  Instructions:  Apply ice: Fill a plastic sandwich bag with crushed ice. Cover it with a small towel and apply to injection site. Apply for 15 minutes then remove x 15 minutes. Repeat sequence on day of procedure, until you go to bed. The purpose is to minimize swelling and discomfort after procedure.  Apply heat: Apply heat to procedure site starting the day following the procedure. The purpose is to treat any soreness and discomfort from the procedure.  Food intake: Start with clear liquids (like water) and advance to regular food, as tolerated.   Physical activities: Keep activities to a minimum for the first 8 hours after the procedure.   Driving: If you have received any sedation, you are not allowed to drive for 24 hours after your procedure.  Blood thinner: Restart your blood thinner 6 hours after your procedure. (Only for those taking blood thinners)  Insulin: As soon as you can eat, you may resume your normal dosing schedule. (Only for those taking insulin)  Infection prevention: Keep procedure site clean and dry.  Post-procedure Pain Diary: Extremely important that this be done correctly and accurately. Recorded information will be used to determine the next step in treatment.  Pain evaluated is that of treated area only. Do not include pain from an untreated area.  Complete every hour, on the hour, for the initial 8 hours. Set an alarm to help you do this part accurately.  Do not go to sleep and have it completed later. It will not be accurate.  Follow-up appointment: Keep your follow-up appointment after the procedure. Usually 2 weeks for most procedures. (6 weeks in the case of radiofrequency.) Bring you pain diary.   Expect:  From numbing medicine (AKA: Local Anesthetics): Numbness or decrease in pain.  Onset: Full effect within 15  minutes of injected.  Duration: It will depend on the type of local anesthetic used. On the average, 1 to 8 hours.   From steroids: Decrease in swelling or inflammation. Once inflammation is improved, relief of the pain will follow.  Onset of benefits: Depends on the amount of swelling present. The more swelling, the longer it will take for the benefits to be seen. In some cases, up to 10 days.  Duration: Steroids will stay in the system x 2 weeks. Duration of benefits will depend on multiple posibilities including persistent irritating factors.  From procedure: Some discomfort is to be expected once the numbing medicine wears off. This should be minimal if ice and heat are applied as instructed.  Call if:  You experience numbness and weakness that gets worse with time, as opposed to wearing off.  New onset bowel or bladder incontinence. (This applies to Spinal procedures only)  Emergency Numbers:  Durning business hours (Monday - Thursday, 8:00 AM - 4:00 PM) (Friday, 9:00 AM - 12:00 Noon): [phone]  After hours: [phone] ____________________________________________________________________________________________ Joshua Humphrey were given one prescription for Tramadol today.____________________________________________________________________________________________  Muscle Spasms & Cramps  Cause:  The most common cause of muscle spasms and cramps is vitamin and/or electrolyte (calcium, potassium, sodium, etc.) deficiencies.  Possible triggers: Sweating - causes loss of electrolytes thru the skin. Steroids - causes loss of electrolytes thru the urine.  Treatment: 1. Gatorade (or any other electrolyte-replenishing drink) - Take 1, 8 oz glass with each meal (3 times a day). 2. OTC (over-the-counter) Magnesium 400 to 500 mg - Take 1 tablet twice a day (one with breakfast and one before bedtime). If you  have kidney problems, talk to your primary care physician before taking any  Magnesium. 3. Tonic Water with quinine - Take 1, 8 oz glass before bedtime.   ____________________________________________________________________________________________

## 2017-09-02 NOTE — Progress Notes (Signed)
Patient's Name: Joshua Humphrey  MRN: [id number]  Referring Provider: Kirk Ruths, MD  DOB: 10-27-1929  PCP: Kirk Ruths, MD  DOS: 09/02/2017  Note by: Gaspar Cola, MD  Service setting: Ambulatory outpatient  Specialty: Interventional Pain Management  Patient type: Established  Location: [facility] (AMB) Pain Management Facility  Visit type: Interventional Procedure   Primary Reason for Visit: Interventional Pain Management Treatment. CC: Back Pain (lower)  Procedure:          Anesthesia, Analgesia, Anxiolysis:  Type: Lumbar Facet, Medial Branch Block(s) #2  Primary Purpose: Diagnostic Region: Posterolateral Lumbosacral Spine Level: L2, L3, L4, L5, & S1 Medial Branch Level(s). Injecting these levels blocks the L3-4, L4-5, and L5-S1 lumbar facet joints. Laterality: Bilateral  Type: Local Anesthesia Indication(s): Analgesia         Route: Infiltration ([place]/IM) IV Access: Declined Sedation: Declined  Local Anesthetic: Lidocaine 1-2%   Indications: 1. Spondylosis without myelopathy or radiculopathy, lumbosacral region   2. Lumbar facet syndrome (Bilateral)   3. Lumbar facet joint osteoarthritis (Bilateral)   4. DDD (degenerative disc disease), lumbar   5. Chronic low back pain (Primary Area of Pain) (Bilateral) (R>L)    Pain Score: Pre-procedure: 4 /10 Post-procedure: 2 /10  Pre-op Assessment:  Joshua Humphrey is a 84 y.o. (year old), male patient, seen today for interventional treatment. He  has a past surgical history that includes tumor removed (2000); Prostate surgery (2002); lipoma removal (2000); Tonsillectomy; Cataract extraction, bilateral; Toe Surgery; Olecranon bursectomy (Left, 01/21/2016); Elbow surgery; and Eye surgery. Joshua Humphrey has a current medication list which includes the following prescription(s): acetaminophen, aspirin ec, atorvastatin, b-d ultrafine iii short pen, calcium carbonate-vitamin d, carvedilol, cetirizine, ferrous fumarate, flaxseed (linseed),  fluticasone, furosemide, glucosamine-chondroitin, hydralazine, ipratropium-albuterol, levemir flextouch, losartan, magnesium oxide, metformin, mometasone-formoterol, montelukast, nifedipine, omeprazole, potassium chloride sa, pramipexole, sitagliptin, benzonatate, gabapentin, ocuvite adult 50+, and tramadol. His primarily concern today is the Back Pain (lower)  Initial Vital Signs:  Pulse/HCG Rate: 69ECG Heart Rate: 68 Temp: 98.3 F (36.8 C) Resp: 15 BP: (!) 161/87 SpO2: 94 %  BMI: Estimated body mass index is 35.51 kg/m as calculated from the following:   Height as of this encounter: 5\' 6"  (1.676 m).   Weight as of this encounter: 220 lb (99.8 kg).  Risk Assessment: Allergies: Reviewed. He has No Known Allergies.  Allergy Precautions: None required Coagulopathies: Reviewed. None identified.  Blood-thinner therapy: None at this time Active Infection(s): Reviewed. None identified. Joshua Humphrey is afebrile  Site Confirmation: Joshua Humphrey was asked to confirm the procedure and laterality before marking the site Procedure checklist: Completed Consent: Before the procedure and under the influence of no sedative(s), amnesic(s), or anxiolytics, the patient was informed of the treatment options, risks and possible complications. To fulfill our ethical and legal obligations, as recommended by the American Medical Association's Code of Ethics, I have informed the patient of my clinical impression; the nature and purpose of the treatment or procedure; the risks, benefits, and possible complications of the intervention; the alternatives, including doing nothing; the risk(s) and benefit(s) of the alternative treatment(s) or procedure(s); and the risk(s) and benefit(s) of doing nothing. The patient was provided information about the general risks and possible complications associated with the procedure. These may include, but are not limited to: failure to achieve desired goals, infection, bleeding, organ or  nerve damage, allergic reactions, paralysis, and death. In addition, the patient was informed of those risks and complications associated to Spine-related procedures, such as failure to  decrease pain; infection (i.e.: Meningitis, epidural or intraspinal abscess); bleeding (i.e.: epidural hematoma, subarachnoid hemorrhage, or any other type of intraspinal or peri-dural bleeding); organ or nerve damage (i.e.: Any type of peripheral nerve, nerve root, or spinal cord injury) with subsequent damage to sensory, motor, and/or autonomic systems, resulting in permanent pain, numbness, and/or weakness of one or several areas of the body; allergic reactions; (i.e.: anaphylactic reaction); and/or death. Furthermore, the patient was informed of those risks and complications associated with the medications. These include, but are not limited to: allergic reactions (i.e.: anaphylactic or anaphylactoid reaction(s)); adrenal axis suppression; blood sugar elevation that in diabetics may result in ketoacidosis or comma; water retention that in patients with history of congestive heart failure may result in shortness of breath, pulmonary edema, and decompensation with resultant heart failure; weight gain; swelling or edema; medication-induced neural toxicity; particulate matter embolism and blood vessel occlusion with resultant organ, and/or nervous system infarction; and/or aseptic necrosis of one or more joints. Finally, the patient was informed that Medicine is not an exact science; therefore, there is also the possibility of unforeseen or unpredictable risks and/or possible complications that may result in a catastrophic outcome. The patient indicated having understood very clearly. We have given the patient no guarantees and we have made no promises. Enough time was given to the patient to ask questions, all of which were answered to the patient's satisfaction. Joshua Humphrey has indicated that he wanted to continue with the  procedure. Attestation: I, the ordering provider, attest that I have discussed with the patient the benefits, risks, side-effects, alternatives, likelihood of achieving goals, and potential problems during recovery for the procedure that I have provided informed consent. Date  Time: 09/02/2017  9:05 AM  Pre-Procedure Preparation:  Monitoring: As per clinic protocol. Respiration, ETCO2, SpO2, BP, heart rate and rhythm monitor placed and checked for adequate function Safety Precautions: Patient was assessed for positional comfort and pressure points before starting the procedure. Time-out: I initiated and conducted the "Time-out" before starting the procedure, as per protocol. The patient was asked to participate by confirming the accuracy of the "Time Out" information. Verification of the correct person, site, and procedure were performed and confirmed by me, the nursing staff, and the patient. "Time-out" conducted as per Joint Commission's Universal Protocol (UP.01.01.01). Time: 1033  Description of Procedure:          Position: Prone Laterality: Bilateral. The procedure was performed in identical fashion on both sides. Levels:  L2, L3, L4, L5, & S1 Medial Branch Level(s) Area Prepped: Posterior Lumbosacral Region Prepping solution: ChloraPrep (2% chlorhexidine gluconate and 70% isopropyl alcohol) Safety Precautions: Aspiration looking for blood return was conducted prior to all injections. At no point did we inject any substances, as a needle was being advanced. Before injecting, the patient was told to immediately notify me if he was experiencing any new onset of "ringing in the ears, or metallic taste in the mouth". No attempts were made at seeking any paresthesias. Safe injection practices and needle disposal techniques used. Medications properly checked for expiration dates. SDV (single dose vial) medications used. After the completion of the procedure, all disposable equipment used was  discarded in the proper designated medical waste containers. Local Anesthesia: Protocol guidelines were followed. The patient was positioned over the fluoroscopy table. The area was prepped in the usual manner. The time-out was completed. The target area was identified using fluoroscopy. A 12-in long, straight, sterile hemostat was used with fluoroscopic guidance to locate the targets  for each level blocked. Once located, the skin was marked with an approved surgical skin marker. Once all sites were marked, the skin (epidermis, dermis, and hypodermis), as well as deeper tissues (fat, connective tissue and muscle) were infiltrated with a small amount of a short-acting local anesthetic, loaded on a 10cc syringe with a 25G, 1.5-in  Needle. An appropriate amount of time was allowed for local anesthetics to take effect before proceeding to the next step. Local Anesthetic: Lidocaine 2.0% The unused portion of the local anesthetic was discarded in the proper designated containers. Technical explanation of process:  L2 Medial Branch Nerve Block (MBB): The target area for the L2 medial branch is at the junction of the postero-lateral aspect of the superior articular process and the superior, posterior, and medial edge of the transverse process of L3. Under fluoroscopic guidance, a Quincke needle was inserted until contact was made with os over the superior postero-lateral aspect of the pedicular shadow (target area). After negative aspiration for blood, 0.5 mL of the nerve block solution was injected without difficulty or complication. The needle was removed intact. L3 Medial Branch Nerve Block (MBB): The target area for the L3 medial branch is at the junction of the postero-lateral aspect of the superior articular process and the superior, posterior, and medial edge of the transverse process of L4. Under fluoroscopic guidance, a Quincke needle was inserted until contact was made with os over the superior  postero-lateral aspect of the pedicular shadow (target area). After negative aspiration for blood, 0.5 mL of the nerve block solution was injected without difficulty or complication. The needle was removed intact. L4 Medial Branch Nerve Block (MBB): The target area for the L4 medial branch is at the junction of the postero-lateral aspect of the superior articular process and the superior, posterior, and medial edge of the transverse process of L5. Under fluoroscopic guidance, a Quincke needle was inserted until contact was made with os over the superior postero-lateral aspect of the pedicular shadow (target area). After negative aspiration for blood, 0.5 mL of the nerve block solution was injected without difficulty or complication. The needle was removed intact. L5 Medial Branch Nerve Block (MBB): The target area for the L5 medial branch is at the junction of the postero-lateral aspect of the superior articular process and the superior, posterior, and medial edge of the sacral ala. Under fluoroscopic guidance, a Quincke needle was inserted until contact was made with os over the superior postero-lateral aspect of the pedicular shadow (target area). After negative aspiration for blood, 0.5 mL of the nerve block solution was injected without difficulty or complication. The needle was removed intact. S1 Medial Branch Nerve Block (MBB): The target area for the S1 medial branch is at the posterior and inferior 6 o'clock position of the L5-S1 facet joint. Under fluoroscopic guidance, the Quincke needle inserted for the L5 MBB was redirected until contact was made with os over the inferior and postero aspect of the sacrum, at the 6 o' clock position under the L5-S1 facet joint (Target area). After negative aspiration for blood, 0.5 mL of the nerve block solution was injected without difficulty or complication. The needle was removed intact. Procedural Needles: 22-gauge, 3.5-inch, Quincke needles used for all  levels. Nerve block solution: 0.2% PF-Ropivacaine + Triamcinolone (40 mg/mL) diluted to a final concentration of 4 mg of Triamcinolone/mL of Ropivacaine The unused portion of the solution was discarded in the proper designated containers.  Once the entire procedure was completed, the treated area was  cleaned, making sure to leave some of the prepping solution back to take advantage of its long term bactericidal properties.   Illustration of the posterior view of the lumbar spine and the posterior neural structures. Laminae of L2 through S1 are labeled. DPRL5, dorsal primary ramus of L5; DPRS1, dorsal primary ramus of S1; DPR3, dorsal primary ramus of L3; FJ, facet (zygapophyseal) joint L3-L4; I, inferior articular process of L4; LB1, lateral branch of dorsal primary ramus of L1; IAB, inferior articular branches from L3 medial branch (supplies L4-L5 facet joint); IBP, intermediate branch plexus; MB3, medial branch of dorsal primary ramus of L3; NR3, third lumbar nerve root; S, superior articular process of L5; SAB, superior articular branches from L4 (supplies L4-5 facet joint also); TP3, transverse process of L3.  Vitals:   09/02/17 1033 09/02/17 1038 09/02/17 1043 09/02/17 1053  BP: (!) 156/91 (!) 166/95 (!) 160/78 (!) 155/88  Pulse:      Resp: 15 16 18 18   Temp:      SpO2: 95% 94% 92% 93%  Weight:      Height:        Start Time: 1033 hrs. End Time: 1043 hrs.  Imaging Guidance (Spinal):          Type of Imaging Technique: Fluoroscopy Guidance (Spinal) Indication(s): Assistance in needle guidance and placement for procedures requiring needle placement in or near specific anatomical locations not easily accessible without such assistance. Exposure Time: Please see nurses notes. Contrast: None used. Fluoroscopic Guidance: I was personally present during the use of fluoroscopy. "Tunnel Vision Technique" used to obtain the best possible view of the target area. Parallax error corrected before  commencing the procedure. "Direction-depth-direction" technique used to introduce the needle under continuous pulsed fluoroscopy. Once target was reached, antero-posterior, oblique, and lateral fluoroscopic projection used confirm needle placement in all planes. Images permanently stored in EMR. Interpretation: No contrast injected. I personally interpreted the imaging intraoperatively. Adequate needle placement confirmed in multiple planes. Permanent images saved into the patient's record.  Antibiotic Prophylaxis:   Anti-infectives (From admission, onward)   None     Indication(s): None identified  Post-operative Assessment:  Post-procedure Vital Signs:  Pulse/HCG Rate: 6971 Temp: 98.3 F (36.8 C) Resp: 18 BP: (!) 155/88 SpO2: 93 %  EBL: None  Complications: No immediate post-treatment complications observed by team, or reported by patient.  Note: The patient tolerated the entire procedure well. A repeat set of vitals were taken after the procedure and the patient was kept under observation following institutional policy, for this type of procedure. Post-procedural neurological assessment was performed, showing return to baseline, prior to discharge. The patient was provided with post-procedure discharge instructions, including a section on how to identify potential problems. Should any problems arise concerning this procedure, the patient was given instructions to immediately contact us, at any time, without hesitation. In any case, we plan to contact the patient by telephone for a follow-up status report regarding this interventional procedure.  Comments:  No additional relevant information.  Plan of Care    Imaging Orders     DG C-Arm 1-60 Min-No Report  Procedure Orders     LUMBAR FACET(MEDIAL BRANCH NERVE BLOCK) MBNB  Medications ordered for procedure: Meds ordered this encounter  Medications  . lidocaine (XYLOCAINE) 2 % (with pres) injection 400 mg  . DISCONTD:  midazolam (VERSED) 5 MG/5ML injection 1-2 mg    Make sure Flumazenil is available in the pyxis when using this medication. If oversedation occurs, administer 0.2 mg  IV over 15 sec. If after 45 sec no response, administer 0.2 mg again over 1 min; may repeat at 1 min intervals; not to exceed 4 doses (1 mg)  . DISCONTD: fentaNYL (SUBLIMAZE) injection 25-50 mcg    Make sure Narcan is available in the pyxis when using this medication. In the event of respiratory depression (RR< 8/min): Titrate NARCAN (naloxone) in increments of 0.1 to 0.2 mg IV at 2-3 minute intervals, until desired degree of reversal.  . DISCONTD: lactated ringers infusion 1,000 mL  . ropivacaine (PF) 2 mg/mL (0.2%) (NAROPIN) injection 18 mL  . triamcinolone acetonide (KENALOG-40) injection 80 mg  . gabapentin (NEURONTIN) 300 MG capsule    Sig: Take 1-3 capsules (300-900 mg total) by mouth 4 (four) times daily. Follow titration schedule.    Dispense:  360 capsule    Refill:  5    Do not place this medication, or any other prescription from our practice, on "Automatic Refill". Patient may have prescription filled one day early if pharmacy is closed on scheduled refill date.  . traMADol (ULTRAM) 50 MG tablet    Sig: Take 1 tablet (50 mg total) by mouth daily as needed for severe pain.    Dispense:  30 tablet    Refill:  5    Do not place this medication, or any other prescription from our practice, on "Automatic Refill". Patient may have prescription filled one day early if pharmacy is closed on scheduled refill date. Do not fill until: 08/27/2017 To last until: 02/23/2018   Medications administered: We administered lidocaine, ropivacaine (PF) 2 mg/mL (0.2%), and triamcinolone acetonide.  See the medical record for exact dosing, route, and time of administration.  New Prescriptions   No medications on file   Disposition: Discharge home  Discharge Date & Time: 09/02/2017; 1100 hrs.   Physician-requested Follow-up: Return for  post-procedure eval (2 wks), w/ Dr. Dossie Arbour.  Future Appointments  Date Time Provider [place]  09/27/2017  1:15 PM Milinda Pointer, MD [facility] None  12/23/2017 11:00 AM Vevelyn Francois, NP [facility] None  05/02/2018  2:15 PM Stoioff, Ronda Fairly, MD [facility] None   Primary Care Physician: Kirk Ruths, MD Location: [facility] Note by: Gaspar Cola, MD Date: 09/02/2017; Time: 11:18 AM  Disclaimer:  Medicine is not an Chief Strategy Officer. The only guarantee in medicine is that nothing is guaranteed. It is important to note that the decision to proceed with this intervention was based on the information collected from the patient. The Data and conclusions were drawn from the patient's questionnaire, the interview, and the physical examination. Because the information was provided in large part by the patient, it cannot be guaranteed that it has not been purposely or unconsciously manipulated. Every effort has been made to obtain as much relevant data as possible for this evaluation. It is important to note that the conclusions that lead to this procedure are derived in large part from the available data. Always take into account that the treatment will also be dependent on availability of resources and existing treatment guidelines, considered by other Pain Management Practitioners as being common knowledge and practice, at the time of the intervention. For Medico-Legal purposes, it is also important to point out that variation in procedural techniques and pharmacological choices are the acceptable norm. The indications, contraindications, technique, and results of the above procedure should only be interpreted and judged by a Board-Certified Interventional Pain Specialist with extensive familiarity and expertise in the same  exact procedure and technique.

## 2017-09-03 ENCOUNTER — Telehealth: Payer: Self-pay

## 2017-09-03 NOTE — Telephone Encounter (Signed)
Post procedure phone call.  LM 

## 2017-09-27 ENCOUNTER — Ambulatory Visit: Payer: Medicare Other | Attending: Pain Medicine | Admitting: Pain Medicine

## 2017-09-27 ENCOUNTER — Encounter: Payer: Self-pay | Admitting: Pain Medicine

## 2017-09-27 VITALS — BP 116/67 | HR 65 | Temp 98.8°F | Resp 16 | Ht 66.0 in | Wt 212.0 lb

## 2017-09-27 DIAGNOSIS — D649 Anemia, unspecified: Secondary | ICD-10-CM | POA: Insufficient documentation

## 2017-09-27 DIAGNOSIS — N181 Chronic kidney disease, stage 1: Secondary | ICD-10-CM | POA: Diagnosis not present

## 2017-09-27 DIAGNOSIS — Z7982 Long term (current) use of aspirin: Secondary | ICD-10-CM | POA: Insufficient documentation

## 2017-09-27 DIAGNOSIS — M170 Bilateral primary osteoarthritis of knee: Secondary | ICD-10-CM

## 2017-09-27 DIAGNOSIS — Z794 Long term (current) use of insulin: Secondary | ICD-10-CM | POA: Insufficient documentation

## 2017-09-27 DIAGNOSIS — M5441 Lumbago with sciatica, right side: Secondary | ICD-10-CM | POA: Diagnosis not present

## 2017-09-27 DIAGNOSIS — M545 Low back pain: Secondary | ICD-10-CM | POA: Insufficient documentation

## 2017-09-27 DIAGNOSIS — I129 Hypertensive chronic kidney disease with stage 1 through stage 4 chronic kidney disease, or unspecified chronic kidney disease: Secondary | ICD-10-CM | POA: Diagnosis not present

## 2017-09-27 DIAGNOSIS — J45909 Unspecified asthma, uncomplicated: Secondary | ICD-10-CM | POA: Diagnosis not present

## 2017-09-27 DIAGNOSIS — G4733 Obstructive sleep apnea (adult) (pediatric): Secondary | ICD-10-CM | POA: Diagnosis not present

## 2017-09-27 DIAGNOSIS — M4802 Spinal stenosis, cervical region: Secondary | ICD-10-CM | POA: Diagnosis not present

## 2017-09-27 DIAGNOSIS — Z9889 Other specified postprocedural states: Secondary | ICD-10-CM | POA: Insufficient documentation

## 2017-09-27 DIAGNOSIS — Z8546 Personal history of malignant neoplasm of prostate: Secondary | ICD-10-CM | POA: Diagnosis not present

## 2017-09-27 DIAGNOSIS — E559 Vitamin D deficiency, unspecified: Secondary | ICD-10-CM | POA: Insufficient documentation

## 2017-09-27 DIAGNOSIS — G894 Chronic pain syndrome: Secondary | ICD-10-CM | POA: Insufficient documentation

## 2017-09-27 DIAGNOSIS — M48062 Spinal stenosis, lumbar region with neurogenic claudication: Secondary | ICD-10-CM | POA: Insufficient documentation

## 2017-09-27 DIAGNOSIS — G8929 Other chronic pain: Secondary | ICD-10-CM | POA: Diagnosis not present

## 2017-09-27 DIAGNOSIS — Z9842 Cataract extraction status, left eye: Secondary | ICD-10-CM | POA: Insufficient documentation

## 2017-09-27 DIAGNOSIS — M25552 Pain in left hip: Secondary | ICD-10-CM

## 2017-09-27 DIAGNOSIS — K219 Gastro-esophageal reflux disease without esophagitis: Secondary | ICD-10-CM | POA: Insufficient documentation

## 2017-09-27 DIAGNOSIS — M1612 Unilateral primary osteoarthritis, left hip: Secondary | ICD-10-CM

## 2017-09-27 DIAGNOSIS — M25562 Pain in left knee: Secondary | ICD-10-CM

## 2017-09-27 DIAGNOSIS — G2581 Restless legs syndrome: Secondary | ICD-10-CM | POA: Diagnosis not present

## 2017-09-27 DIAGNOSIS — M5442 Lumbago with sciatica, left side: Secondary | ICD-10-CM

## 2017-09-27 DIAGNOSIS — Z79891 Long term (current) use of opiate analgesic: Secondary | ICD-10-CM | POA: Insufficient documentation

## 2017-09-27 DIAGNOSIS — M25561 Pain in right knee: Secondary | ICD-10-CM | POA: Diagnosis not present

## 2017-09-27 DIAGNOSIS — E669 Obesity, unspecified: Secondary | ICD-10-CM | POA: Diagnosis not present

## 2017-09-27 DIAGNOSIS — E1122 Type 2 diabetes mellitus with diabetic chronic kidney disease: Secondary | ICD-10-CM | POA: Diagnosis not present

## 2017-09-27 DIAGNOSIS — M5116 Intervertebral disc disorders with radiculopathy, lumbar region: Secondary | ICD-10-CM | POA: Insufficient documentation

## 2017-09-27 DIAGNOSIS — D170 Benign lipomatous neoplasm of skin and subcutaneous tissue of head, face and neck: Secondary | ICD-10-CM | POA: Insufficient documentation

## 2017-09-27 DIAGNOSIS — Z79899 Other long term (current) drug therapy: Secondary | ICD-10-CM | POA: Insufficient documentation

## 2017-09-27 DIAGNOSIS — E785 Hyperlipidemia, unspecified: Secondary | ICD-10-CM | POA: Diagnosis not present

## 2017-09-27 DIAGNOSIS — Z9841 Cataract extraction status, right eye: Secondary | ICD-10-CM | POA: Insufficient documentation

## 2017-09-27 NOTE — Patient Instructions (Addendum)
____________________________________________________________________________________________  Preparing for your procedure (without sedation)  Instructions: . Oral Intake: Do not eat or drink anything for at least 3 hours prior to your procedure. . Transportation: Unless otherwise stated by your physician, you may drive yourself after the procedure. . Blood Pressure Medicine: Take your blood pressure medicine with a sip of water the morning of the procedure. . Blood thinners: Notify our staff if you are taking any blood thinners. Depending on which one you take, there will be specific instructions on how and when to stop it. . Diabetics on insulin: Notify the staff so that you can be scheduled 1st case in the morning. If your diabetes requires high dose insulin, take only  of your normal insulin dose the morning of the procedure and notify the staff that you have done so. . Preventing infections: Shower with an antibacterial soap the morning of your procedure.  . Build-up your immune system: Take 1000 mg of Vitamin C with every meal (3 times a day) the day prior to your procedure. Marland Kitchen Antibiotics: Inform the staff if you have a condition or reason that requires you to take antibiotics before dental procedures. . Pregnancy: If you are pregnant, call and cancel the procedure. . Sickness: If you have a cold, fever, or any active infections, call and cancel the procedure. . Arrival: You must be in the facility at least 30 minutes prior to your scheduled procedure. . Children: Do not bring any children with you. . Dress appropriately: Bring dark clothing that you would not mind if they get stained. . Valuables: Do not bring any jewelry or valuables.  Procedure appointments are reserved for interventional treatments only. Marland Kitchen No Prescription Refills. . No medication changes will be discussed during procedure appointments. . No disability issues will be discussed.  Reasons to call and reschedule or  cancel your procedure: (Following these recommendations will minimize the risk of a serious complication.) . Surgeries: Avoid having procedures within 2 weeks of any surgery. (Avoid for 2 weeks before or after any surgery). . Flu Shots: Avoid having procedures within 2 weeks of a flu shots or . (Avoid for 2 weeks before or after immunizations). . Barium: Avoid having a procedure within 7-10 days after having had a radiological study involving the use of radiological contrast. (Myelograms, Barium swallow or enema study). . Heart attacks: Avoid any elective procedures or surgeries for the initial 6 months after a "Myocardial Infarction" (Heart Attack). . Blood thinners: It is imperative that you stop these medications before procedures. Let us know if you if you take any blood thinner.  . Infection: Avoid procedures during or within two weeks of an infection (including chest colds or gastrointestinal problems). Symptoms associated with infections include: Localized redness, fever, chills, night sweats or profuse sweating, burning sensation when voiding, cough, congestion, stuffiness, runny nose, sore throat, diarrhea, nausea, vomiting, cold or Flu symptoms, recent or current infections. It is specially important if the infection is over the area that we intend to treat. Marland Kitchen Heart and lung problems: Symptoms that may suggest an active cardiopulmonary problem include: cough, chest pain, breathing difficulties or shortness of breath, dizziness, ankle swelling, uncontrolled high or unusually low blood pressure, and/or palpitations. If you are experiencing any of these symptoms, cancel your procedure and contact your primary care physician for an evaluation.  Remember:  Regular Business hours are:  Monday to Thursday 8:00 AM to 4:00 PM  Provider's Schedule: Milinda Pointer, MD:  Procedure days: Tuesday and Thursday 7:30 AM to  4:00 PM  Gillis Santa, MD:  Procedure days: Monday and Wednesday 7:30 AM to 4:00  PM ____________________________________________________________________________________________   ____________________________________________________________________________________________  General Risks and Possible Complications  Patient Responsibilities: It is important that you read this as it is part of your informed consent. It is our duty to inform you of the risks and possible complications associated with treatments offered to you. It is your responsibility as a patient to read this and to ask questions about anything that is not clear or that you believe was not covered in this document.  Patient's Rights: You have the right to refuse treatment. You also have the right to change your mind, even after initially having agreed to have the treatment done. However, under this last option, if you wait until the last second to change your mind, you may be charged for the materials used up to that point.  Introduction: Medicine is not an Chief Strategy Officer. Everything in Medicine, including the lack of treatment(s), carries the potential for danger, harm, or loss (which is by definition: Risk). In Medicine, a complication is a secondary problem, condition, or disease that can aggravate an already existing one. All treatments carry the risk of possible complications. The fact that a side effects or complications occurs, does not imply that the treatment was conducted incorrectly. It must be clearly understood that these can happen even when everything is done following the highest safety standards.  No treatment: You can choose not to proceed with the proposed treatment alternative. The "PRO(s)" would include: avoiding the risk of complications associated with the therapy. The "CON(s)" would include: not getting any of the treatment benefits. These benefits fall under one of three categories: diagnostic; therapeutic; and/or palliative. Diagnostic benefits include: getting information which can ultimately  lead to improvement of the disease or symptom(s). Therapeutic benefits are those associated with the successful treatment of the disease. Finally, palliative benefits are those related to the decrease of the primary symptoms, without necessarily curing the condition (example: decreasing the pain from a flare-up of a chronic condition, such as incurable terminal cancer).  General Risks and Complications: These are associated to most interventional treatments. They can occur alone, or in combination. They fall under one of the following six (6) categories: no benefit or worsening of symptoms; bleeding; infection; nerve damage; allergic reactions; and/or death. 1. No benefits or worsening of symptoms: In Medicine there are no guarantees, only probabilities. No healthcare provider can ever guarantee that a medical treatment will work, they can only state the probability that it may. Furthermore, there is always the possibility that the condition may worsen, either directly, or indirectly, as a consequence of the treatment. 2. Bleeding: This is more common if the patient is taking a blood thinner, either prescription or over the counter (example: Goody Powders, Fish oil, Aspirin, Garlic, etc.), or if suffering a condition associated with impaired coagulation (example: Hemophilia, cirrhosis of the liver, low platelet counts, etc.). However, even if you do not have one on these, it can still happen. If you have any of these conditions, or take one of these drugs, make sure to notify your treating physician. 3. Infection: This is more common in patients with a compromised immune system, either due to disease (example: diabetes, cancer, human immunodeficiency virus [HIV], etc.), or due to medications or treatments (example: therapies used to treat cancer and rheumatological diseases). However, even if you do not have one on these, it can still happen. If you have any of these conditions, or  take one of these drugs, make  sure to notify your treating physician. 4. Nerve Damage: This is more common when the treatment is an invasive one, but it can also happen with the use of medications, such as those used in the treatment of cancer. The damage can occur to small secondary nerves, or to large primary ones, such as those in the spinal cord and brain. This damage may be temporary or permanent and it may lead to impairments that can range from temporary numbness to permanent paralysis and/or brain death. 5. Allergic Reactions: Any time a substance or material comes in contact with our body, there is the possibility of an allergic reaction. These can range from a mild skin rash (contact dermatitis) to a severe systemic reaction (anaphylactic reaction), which can result in death. 6. Death: In general, any medical intervention can result in death, most of the time due to an unforeseen complication. ____________________________________________________________________________________________   Knee Injection A knee injection is a procedure to get medicine into your knee joint. Your health care provider puts a needle into the joint and injects medicine with an attached syringe. The injected medicine may relieve the pain, swelling, and stiffness of arthritis. The injected medicine may also help to lubricate and cushion your knee joint. You may need more than one injection. Tell a health care provider about:  Any allergies you have.  All medicines you are taking, including vitamins, herbs, eye drops, creams, and over-the-counter medicines.  Any problems you or family members have had with anesthetic medicines.  Any blood disorders you have.  Any surgeries you have had.  Any medical conditions you have. What are the risks? Generally, this is a safe procedure. However, problems may occur, including:  Infection.  Bleeding.  Worsening symptoms.  Damage to the area around your knee.  Allergic reaction to any of the  medicines.  Skin reactions from repeated injections.  What happens before the procedure?  Ask your health care provider about changing or stopping your regular medicines. This is especially important if you are taking diabetes medicines or blood thinners.  Plan to have someone take you home after the procedure. What happens during the procedure?  You will sit or lie down in a position for your knee to be treated.  The skin over your kneecap will be cleaned with a germ-killing solution (antiseptic).  You will be given a medicine that numbs the area (local anesthetic). You may feel some stinging.  After your knee becomes numb, you will have a second injection. This is the medicine. This needle is carefully placed between your kneecap and your knee. The medicine is injected into the joint space.  At the end of the procedure, the needle will be removed.  A bandage (dressing) may be placed over the injection site. The procedure may vary among health care providers and hospitals. What happens after the procedure?  You may have to move your knee through its full range of motion. This helps to get all of the medicine into your joint space.  Your blood pressure, heart rate, breathing rate, and blood oxygen level will be monitored often until the medicines you were given have worn off.  You will be watched to make sure that you do not have a reaction to the injected medicine. This information is not intended to replace advice given to you by your health care provider. Make sure you discuss any questions you have with your health care provider. Document Released: 05/17/2006 Document Revised: 07/26/2015  Document Reviewed: 01/03/2014 [employer] Interactive Patient Education  [employer].

## 2017-09-27 NOTE — Progress Notes (Signed)
Patient's Name: Joshua Humphrey  MRN: [id number]  Referring Provider: Kirk Ruths, MD  DOB: 01-07-1930  PCP: Kirk Ruths, MD  DOS: 09/27/2017  Note by: Gaspar Cola, MD  Service setting: Ambulatory outpatient  Specialty: Interventional Pain Management  Location: [facility] (AMB) Pain Management Facility    Patient type: Established   Primary Reason(s) for Visit: Encounter for post-procedure evaluation of chronic illness with mild to moderate exacerbation CC: Back Pain (lower bilateral ) and Knee Pain (bilateral)  HPI  Mr. Ruta is a 84 y.o. year old, male patient, who comes today for a post-procedure evaluation. He has Lipoma of neck; Allergic rhinitis; Anemia, unspecified; Asthma; Cataract; Cortical senile cataract; GERD (gastroesophageal reflux disease); Health care maintenance; Hyperlipidemia, unspecified; Hypertension; Mass of subcutaneous tissue of back; Obesity, unspecified; Obstructive sleep apnea; Post-operative state; Prostate cancer ([place]); Restless leg syndrome; Scoliosis (and kyphoscoliosis), idiopathic; Lumbar central spinal stenosis (Severe:L4-5; Mild:L3-4; Moderate:L2-3); Type 2 diabetes mellitus with stage 1 chronic kidney disease ([place]); Chronic pain syndrome; Long term (current) use of opiate analgesic; Long term prescription opiate use; Opiate use (30 MME/Day); Chronic low back pain (Primary Area of Pain) (Bilateral) (R>L); Chronic lower extremity pain (Tertiary source of pain) (Bilateral) (L>R); Abnormal MRI, lumbar spine; Abnormal MRI, cervical spine; Lumbar spinal stenosis with neurogenic claudication; Disturbance of skin sensation; Chronic hip pain (Secondary Area of Pain) (Left); Chronic knee pain (Bilateral) (L>R); Generalized weakness; Muscle weakness (generalized); Chronic Lumbar radicular pain (Left) (L5); Neurogenic pain; Osteoarthritis; Lumbar foraminal stenosis (Right: L4-5 and L5-S1) (Left: L2-3, L3-4, and L4-5); Osteoarthritis of hip (Left); Chronic hip  arthralgia (Left); Chronic arthralgias of knees and hips (Left); Vitamin D insufficiency; Cervical spinal stenosis; Cervical foraminal stenosis; Lumbar facet syndrome (Bilateral); Osteoarthritis of lumbar spine; Osteoarthritis of knee (Bilateral); Abnormal knee x-ray (2017); Lumbar facet joint osteoarthritis (Bilateral); Ataxia; Lumbar spondylosis; Spondylosis without myelopathy or radiculopathy, lumbosacral region; DDD (degenerative disc disease), lumbar; and Weakness of lower extremities (Bilateral) on their problem list. His primarily concern today is the Back Pain (lower bilateral ) and Knee Pain (bilateral)  Pain Assessment: Location: Lower, Left, Right Back Radiating: denies Onset: More than a month ago Duration: Chronic pain Quality: (no pain in the back.  right knee has a grinding sensation and occassional sharp pain in the knee) Severity: 2 /10 (subjective, self-reported pain score)  Note: Reported level is compatible with observation.                         When using our objective Pain Scale, levels between 6 and 10/10 are said to belong in an emergency room, as it progressively worsens from a 6/10, described as severely limiting, requiring emergency care not usually available at an outpatient pain management facility. At a 6/10 level, communication becomes difficult and requires great effort. Assistance to reach the emergency department may be required. Facial flushing and profuse sweating along with potentially dangerous increases in heart rate and blood pressure will be evident. Effect on ADL: has to pace himself  Timing: Constant Modifying factors: hyalgan did help but does not feel that there is any benefit now.  facet block has helped the back  BP: 116/67  HR: 65  Mr. Wolman comes in today for post-procedure evaluation after the treatment done on 09/03/2017. The patient returns after they bilateral lumbar facet block indicating good relief. However, he is beginning to experience more  knee pain and hip pain. In the past we have done intra-articular injections including a series  of 5 intra-articular Hyalgan knee injections. The last injection was done in January and therefore is being more than 6 months since that series. Today I talked to the patient about the option of doing a genicular nerve blocks followed by radiofrequency ablation. He opted for the more conservative treatment repeating the series of 5 intra-articular Hyalgan knee injections. In terms of his hip pain, he will like to again have another hip injection done, as soon as possible. He has requested that we do both knees and the hip on the same day.  Further details on both, my assessment(s), as well as the proposed treatment plan, please see below.  Post-Procedure Assessment  09/02/2017 Procedure: Diagnostic bilateral lumbar facet block #2 under fluoroscopic guidance, no sedation Pre-procedure pain score:  4/10 Post-procedure pain score: 2/10 No initial benefit, possible due to rapid discharge after no sedation procedure, without enough time to allow full onset of block. Influential Factors: BMI: 34.22 kg/m Intra-procedural challenges: None observed.         Assessment challenges: None detected.              Reported side-effects: None.        Post-procedural adverse reactions or complications: None reported         Sedation: No sedation used. When no sedatives are used, the analgesic levels obtained are directly associated to the effectiveness of the local anesthetics. However, when sedation is provided, the level of analgesia obtained during the initial 1 hour following the intervention, is believed to be the result of a combination of factors. These factors may include, but are not limited to: 1. The effectiveness of the local anesthetics used. 2. The effects of the analgesic(s) and/or anxiolytic(s) used. 3. The degree of discomfort experienced by the patient at the time of the procedure. 4. The patients  ability and reliability in recalling and recording the events. 5. The presence and influence of possible secondary gains and/or psychosocial factors. Reported result: Relief experienced during the 1st hour after the procedure: 100 % (Ultra-Short Term Relief)            Interpretative annotation: Clinically appropriate result. No IV Analgesic or Anxiolytic given, therefore benefits are completely due to Local Anesthetic effects.          Effects of local anesthetic: The analgesic effects attained during this period are directly associated to the localized infiltration of local anesthetics and therefore cary significant diagnostic value as to the etiological location, or anatomical origin, of the pain. Expected duration of relief is directly dependent on the pharmacodynamics of the local anesthetic used. Long-acting (4-6 hours) anesthetics used.  Reported result: Relief during the next 4 to 6 hour after the procedure: 100 % (Short-Term Relief)            Interpretative annotation: Clinically appropriate result. Analgesia during this period is likely to be Local Anesthetic-related.          Long-term benefit: Defined as the period of time past the expected duration of local anesthetics (1 hour for short-acting and 4-6 hours for long-acting). With the possible exception of prolonged sympathetic blockade from the local anesthetics, benefits during this period are typically attributed to, or associated with, other factors such as analgesic sensory neuropraxia, antiinflammatory effects, or beneficial biochemical changes provided by agents other than the local anesthetics.  Reported result: Extended relief following procedure: 90 % (Long-Term Relief)            Interpretative annotation: Clinically appropriate result. Good relief. No  permanent benefit expected. Inflammation plays a part in the etiology to the pain.          Current benefits: Defined as reported results that persistent at this point in time.    Analgesia: 90 % Mr. Junker reports improvement of axial symptoms. Function: Somewhat improved ROM: Somewhat improved Interpretative annotation: Ongoing benefit. Therapeutic benefit observed. Effective therapeutic approach.          Interpretation: Results would suggest a successful diagnostic intervention.                  Plan:  Set up procedure as a PRN palliative treatment option for this patient.                Laboratory Chemistry  Inflammation Markers (CRP: Acute Phase) (ESR: Chronic Phase) Lab Results  Component Value Date   CRP 0.7 09/08/2016   ESRSEDRATE 16 09/08/2016                         Renal Markers Lab Results  Component Value Date   BUN 20 09/08/2016   CREATININE 0.75 (L) 09/08/2016   BCR 27 (H) 09/08/2016   GFRAA 97 09/08/2016   GFRNONAA 84 09/08/2016                             Hepatic Markers Lab Results  Component Value Date   AST 17 09/08/2016   ALT 15 09/08/2016   ALBUMIN 4.2 09/08/2016                        Neuropathy Markers No results found.  Hematology Parameters Lab Results  Component Value Date   PLT 275 01/20/2016   HGB 12.1 (L) 01/20/2016   HCT 36.3 (L) 01/20/2016                        CV Markers No results found.  Note: Lab results reviewed.  Recent Diagnostic Imaging Results  DG C-Arm 1-60 Min-No Report Fluoroscopy was utilized by the requesting physician.  No radiographic  interpretation.   Complexity Note: I personally reviewed the fluoroscopic imaging of the procedure.                        Meds   Current Outpatient Medications:  .  acetaminophen (TYLENOL) 650 MG CR tablet, Take 650-1,300 mg by mouth every 8 (eight) hours as needed (for pain.). , Disp: , Rfl:  .  aspirin EC 81 MG tablet, Take 81 mg by mouth daily. , Disp: , Rfl:  .  atorvastatin (LIPITOR) 40 MG tablet, Take 40 mg by mouth daily. , Disp: , Rfl:  .  B-D ULTRAFINE III SHORT PEN 31G X 8 MM MISC, USE BID UTD, Disp: , Rfl: 11 .  benzonatate  (TESSALON) 200 MG capsule, Take 200 mg by mouth as needed., Disp: , Rfl: 0 .  Calcium Carbonate-Vitamin D (CALTRATE 600+D PO), Take 2 tablets by mouth daily., Disp: , Rfl:  .  carvedilol (COREG) 25 MG tablet, TAKE 1 TABLET (25 MG) TWICE DAILY, Disp: , Rfl:  .  cetirizine (ZYRTEC) 10 MG tablet, Take 10 mg by mouth every evening. , Disp: , Rfl:  .  ferrous fumarate (HEMOCYTE - 106 MG FE) 325 (106 FE) MG TABS tablet, Take 1 tablet by mouth 2 (two) times daily. , Disp: , Rfl:  .  FLAXSEED, LINSEED, PO, Take by mouth daily., Disp: , Rfl:  .  fluticasone (FLONASE) 50 MCG/ACT nasal spray, Place 1-2 sprays into both nostrils daily as needed (for allergies.). , Disp: , Rfl:  .  furosemide (LASIX) 40 MG tablet, TAKE 1 TABLET BY MOUTH TWICE DAILY, Disp: , Rfl:  .  gabapentin (NEURONTIN) 300 MG capsule, Take 1-3 capsules (300-900 mg total) by mouth 4 (four) times daily. Follow titration schedule., Disp: 360 capsule, Rfl: 5 .  Glucosamine-Chondroit-Vit C-Mn (GLUCOSAMINE-CHONDROITIN) CAPS, Take 2 capsules by mouth daily., Disp: , Rfl:  .  hydrALAZINE (APRESOLINE) 50 MG tablet, Take 50 mg by mouth 3 (three) times daily. , Disp: , Rfl:  .  Ipratropium-Albuterol (COMBIVENT RESPIMAT) 20-100 MCG/ACT AERS respimat, INHALE 2 PUFF FOUR TIMES DAILY AS NEEDED FOR SHORTNESS OF BREATH AND/OR WHEEZING., Disp: , Rfl:  .  LEVEMIR FLEXTOUCH 100 UNIT/ML Pen, Inject 15 Units into the skin every evening. , Disp: , Rfl:  .  losartan (COZAAR) 100 MG tablet, Take 100 mg by mouth daily. , Disp: , Rfl:  .  magnesium oxide (MAG-OX) 400 MG tablet, Take 400 mg by mouth daily., Disp: , Rfl:  .  metFORMIN (GLUCOPHAGE) 850 MG tablet, Take 850 mg by mouth 2 (two) times daily. , Disp: , Rfl:  .  mometasone-formoterol (DULERA) 200-5 MCG/ACT AERO, Inhale 2 puffs into the lungs 2 (two) times daily. , Disp: , Rfl:  .  montelukast (SINGULAIR) 10 MG tablet, Take 10 mg by mouth at bedtime., Disp: , Rfl:  .  Multiple Vitamins-Minerals (OCUVITE ADULT  50+) CAPS, Take 1 capsule by mouth daily. , Disp: , Rfl:  .  NIFEdipine (PROCARDIA XL/ADALAT-CC) 90 MG 24 hr tablet, Take 90 mg by mouth daily. , Disp: , Rfl:  .  omeprazole (PRILOSEC) 20 MG capsule, Take 20 mg by mouth daily., Disp: , Rfl:  .  potassium chloride SA (K-DUR,KLOR-CON) 20 MEQ tablet, TAKE 1 TABLET(20 MEQ) BY MOUTH TWICE DAILY, Disp: , Rfl:  .  pramipexole (MIRAPEX) 0.25 MG tablet, Take 0.25 mg by mouth at bedtime as needed (for restless leg syndrome). , Disp: , Rfl:  .  sitaGLIPtin (JANUVIA) 100 MG tablet, Take 100 mg by mouth daily., Disp: , Rfl:  .  traMADol (ULTRAM) 50 MG tablet, Take 1 tablet (50 mg total) by mouth daily as needed for severe pain., Disp: 30 tablet, Rfl: 5  ROS  Constitutional: Denies any fever or chills Gastrointestinal: No reported hemesis, hematochezia, vomiting, or acute GI distress Musculoskeletal: Denies any acute onset joint swelling, redness, loss of ROM, or weakness Neurological: No reported episodes of acute onset apraxia, aphasia, dysarthria, agnosia, amnesia, paralysis, loss of coordination, or loss of consciousness  Allergies  Mr. Camplin has No Known Allergies.  [place]  Drug: Mr. Anthes  reports that he does not use drugs. Alcohol:  reports that he drinks alcohol. Tobacco:  reports that he has never smoked. He has never used smokeless tobacco. Medical:  has a past medical history of Anemia, Asthma, Cancer ([place]), Cancer ([place]), Diabetes mellitus without complication ([place]), GERD (gastroesophageal reflux disease), Hypertension, PONV (postoperative nausea and vomiting), Respiratory infection (08/23/2017), Septic olecranon bursitis of left elbow (01/22/2016), and Sleep apnea. Surgical: Mr. Ziegler  has a past surgical history that includes tumor removed (2000); Prostate surgery (2002); lipoma removal (2000); Tonsillectomy; Cataract extraction, bilateral; Toe Surgery; Olecranon bursectomy (Left, 01/21/2016); Elbow surgery; and Eye surgery. Family: family  history includes Alcohol abuse in his father; Cancer in his brother and mother.  Constitutional Exam  General appearance: Well nourished, well developed, and well hydrated. In no apparent acute distress Vitals:   09/27/17 1331  BP: 116/67  Pulse: 65  Resp: 16  Temp: 98.8 F (37.1 C)  TempSrc: Oral  SpO2: 93%  Weight: 212 lb (96.2 kg)  Height: '5\' 6"'$  (1.676 m)   BMI Assessment: Estimated body mass index is 34.22 kg/m as calculated from the following:   Height as of this encounter: '5\' 6"'$  (1.676 m).   Weight as of this encounter: 212 lb (96.2 kg).  BMI interpretation table: BMI level Category Range association with higher incidence of chronic pain  <18 kg/m2 Underweight   18.5-24.9 kg/m2 Ideal body weight   25-29.9 kg/m2 Overweight Increased incidence by 20%  30-34.9 kg/m2 Obese (Class I) Increased incidence by 68%  35-39.9 kg/m2 Severe obesity (Class II) Increased incidence by 136%  >40 kg/m2 Extreme obesity (Class III) Increased incidence by 254%   Patient's current BMI Ideal Body weight  Body mass index is 34.22 kg/m. Ideal body weight: 63.8 kg (140 lb 10.5 oz) Adjusted ideal body weight: 76.7 kg (169 lb 3.1 oz)   BMI Readings from Last 4 Encounters:  09/27/17 34.22 kg/m  09/02/17 35.51 kg/m  06/30/17 34.70 kg/m  04/30/17 37.12 kg/m   Wt Readings from Last 4 Encounters:  09/27/17 212 lb (96.2 kg)  09/02/17 220 lb (99.8 kg)  06/30/17 215 lb (97.5 kg)  04/30/17 230 lb (104.3 kg)  Psych/Mental status: Alert, oriented x 3 (person, place, & time)       Eyes: PERLA Respiratory: No evidence of acute respiratory distress  Cervical Spine Area Exam  Skin & Axial Inspection: No masses, redness, edema, swelling, or associated skin lesions Alignment: Symmetrical Functional ROM: Unrestricted ROM      Stability: No instability detected Muscle Tone/Strength: Functionally intact. No obvious neuro-muscular anomalies detected. Sensory (Neurological): Unimpaired Palpation: No  palpable anomalies              Upper Extremity (UE) Exam    Side: Right upper extremity  Side: Left upper extremity  Skin & Extremity Inspection: Skin color, temperature, and hair growth are WNL. No peripheral edema or cyanosis. No masses, redness, swelling, asymmetry, or associated skin lesions. No contractures.  Skin & Extremity Inspection: Skin color, temperature, and hair growth are WNL. No peripheral edema or cyanosis. No masses, redness, swelling, asymmetry, or associated skin lesions. No contractures.  Functional ROM: Unrestricted ROM          Functional ROM: Unrestricted ROM          Muscle Tone/Strength: Functionally intact. No obvious neuro-muscular anomalies detected.  Muscle Tone/Strength: Functionally intact. No obvious neuro-muscular anomalies detected.  Sensory (Neurological): Unimpaired          Sensory (Neurological): Unimpaired          Palpation: No palpable anomalies              Palpation: No palpable anomalies              Provocative Test(s):  Phalen's test: deferred Tinel's test: deferred Apley's scratch test (touch opposite shoulder):  Action 1 (Across chest): deferred Action 2 (Overhead): deferred Action 3 (LB reach): deferred   Provocative Test(s):  Phalen's test: deferred Tinel's test: deferred Apley's scratch test (touch opposite shoulder):  Action 1 (Across chest): deferred Action 2 (Overhead): deferred Action 3 (LB reach): deferred    Thoracic Spine Area Exam  Skin & Axial Inspection: No masses, redness, or swelling Alignment: Symmetrical Functional ROM: Unrestricted  ROM Stability: No instability detected Muscle Tone/Strength: Functionally intact. No obvious neuro-muscular anomalies detected. Sensory (Neurological): Unimpaired Muscle strength & Tone: No palpable anomalies  Lumbar Spine Area Exam  Skin & Axial Inspection: No masses, redness, or swelling Alignment: Symmetrical Functional ROM: Minimal ROM       Stability: No instability  detected Muscle Tone/Strength: Functionally intact. No obvious neuro-muscular anomalies detected. Sensory (Neurological): Movement-associated discomfort Palpation: Non-tender       Provocative Tests: Lumbar Hyperextension/rotation test: deferred today       Lumbar quadrant test (Kemp's test): deferred today       Lumbar Lateral bending test: deferred today       Patrick's Maneuver: deferred today                   FABER test: deferred today                   Thigh-thrust test: deferred today       S-I compression test: deferred today       S-I distraction test: deferred today        Gait & Posture Assessment  Ambulation: Patient ambulates using a walker Gait: Significantly limited. Dependent on assistive device to ambulate Posture: Difficulty standing up straight, due to pain   Lower Extremity Exam    Side: Right lower extremity  Side: Left lower extremity  Stability: No instability observed          Stability: No instability observed          Skin & Extremity Inspection: Skin color, temperature, and hair growth are WNL. No peripheral edema or cyanosis. No masses, redness, swelling, asymmetry, or associated skin lesions. No contractures.  Skin & Extremity Inspection: Skin color, temperature, and hair growth are WNL. No peripheral edema or cyanosis. No masses, redness, swelling, asymmetry, or associated skin lesions. No contractures.  Functional ROM: Decreased ROM for hip and knee joints          Functional ROM: Decreased ROM for hip and knee joints          Muscle Tone/Strength: Functionally intact. No obvious neuro-muscular anomalies detected.  Muscle Tone/Strength: Functionally intact. No obvious neuro-muscular anomalies detected.  Sensory (Neurological): Unimpaired  Sensory (Neurological): Unimpaired  Palpation: No palpable anomalies  Palpation: No palpable anomalies   Assessment  Primary Diagnosis & Pertinent Problem List: The primary encounter diagnosis was Chronic low back pain  (Primary Area of Pain) (Bilateral) (R>L). Diagnoses of Chronic hip pain (Secondary Area of Pain) (Left), Chronic knee pain (Bilateral) (L>R), Osteoarthritis of knee (Bilateral), Chronic hip arthralgia (Left), and Osteoarthritis of hip (Left) were also pertinent to this visit.  Status Diagnosis  Controlled Worsening Worsening 1. Chronic low back pain (Primary Area of Pain) (Bilateral) (R>L)   2. Chronic hip pain (Secondary Area of Pain) (Left)   3. Chronic knee pain (Bilateral) (L>R)   4. Osteoarthritis of knee (Bilateral)   5. Chronic hip arthralgia (Left)   6. Osteoarthritis of hip (Left)     Problems updated and reviewed during this visit: No problems updated. Plan of Care  Pharmacotherapy (Medications Ordered): No orders of the defined types were placed in this encounter.  Medications administered today: Braylee Bosher. Frisbee had no medications administered during this visit.   Procedure Orders     KNEE INJECTION     HIP INJECTION Lab Orders  No laboratory test(s) ordered today   Imaging Orders  No imaging studies ordered today   Referral Orders  No referral(s)  requested today    Interventional management options: Planned, scheduled, and/or pending: Palliative bilateral IA Hyalgan knee injection #1 (S2) + Left IA Hip joint injection #2    Considering: Diagnostic left intra-articular hip joint injection Possible diagnostic left femoral nerve + obturator nerve block Possible left femoral nerve + obturator nerve RFA Diagnostic left L2-3 interlaminar lumbar epiduralsteroid injection Diagnostic left L3-4 interlaminar lumbar epiduralsteroid injection Diagnosticleft L4-5 interlaminar lumbar epiduralsteroid injection Diagnosticleft L2-3, L3-4 and L4-5 transforaminal epiduralsteroid injection  Diagnostic right L4-5 and L5-S1 transforaminal epiduralsteroid injection  Diagnostic bilateral lumbar facet block Possible bilateral lumbar facet RFA Diagnostic bilateral  intra-articular knee joint injection with local anesthetic and steroids Possible series of 5 bilateral intra-articular Hyalgan knee injections Diagnostic bilateral genicular nerve block Possible bilateral genicular nerve RFA   Palliative PRN treatment(s): Palliative/Diagnostic bilateral lumbar facet block#2  Palliativeleft intra-articular hip joint injection Diagnostic left L2-3 interlaminar lumbar epiduralsteroid injection Diagnostic left L3-4 interlaminar lumbar epiduralsteroid injection Diagnosticleft L4-5 interlaminar lumbar epiduralsteroid injection Diagnosticleft L2-3, L3-4 and L4-5 transforaminal epiduralsteroid injection  Diagnostic right L4-5 and L5-S1 transforaminal epiduralsteroid injection    Provider-requested follow-up: Return for Procedure (no sedation): (B) Hyalgan #1 (S2) + (L) Hip BLK #2.  Future Appointments  Date Time Provider [place]  12/23/2017 11:00 AM Vevelyn Francois, NP [facility] None  05/02/2018  2:15 PM Stoioff, Ronda Fairly, MD [facility] None   Primary Care Physician: Kirk Ruths, MD Location: [facility] Note by: Gaspar Cola, MD Date: 09/27/2017; Time: 2:13 PM

## 2017-10-07 ENCOUNTER — Ambulatory Visit: Payer: Medicare Other | Admitting: Pain Medicine

## 2017-10-12 ENCOUNTER — Ambulatory Visit (HOSPITAL_BASED_OUTPATIENT_CLINIC_OR_DEPARTMENT_OTHER): Payer: Medicare Other | Admitting: Pain Medicine

## 2017-10-12 ENCOUNTER — Other Ambulatory Visit: Payer: Self-pay

## 2017-10-12 ENCOUNTER — Ambulatory Visit
Admission: RE | Admit: 2017-10-12 | Discharge: 2017-10-12 | Disposition: A | Discharge status: Home / Self Care | Payer: Medicare Other | Source: Ambulatory Visit | Attending: Pain Medicine | Admitting: Pain Medicine

## 2017-10-12 ENCOUNTER — Encounter: Payer: Self-pay | Admitting: Pain Medicine

## 2017-10-12 VITALS — BP 146/88 | HR 65 | Temp 98.8°F | Resp 16 | Ht 69.0 in | Wt 218.0 lb

## 2017-10-12 DIAGNOSIS — G8929 Other chronic pain: Secondary | ICD-10-CM

## 2017-10-12 DIAGNOSIS — M170 Bilateral primary osteoarthritis of knee: Secondary | ICD-10-CM

## 2017-10-12 DIAGNOSIS — M1612 Unilateral primary osteoarthritis, left hip: Secondary | ICD-10-CM

## 2017-10-12 DIAGNOSIS — R600 Localized edema: Secondary | ICD-10-CM | POA: Insufficient documentation

## 2017-10-12 DIAGNOSIS — M25552 Pain in left hip: Secondary | ICD-10-CM

## 2017-10-12 DIAGNOSIS — M25561 Pain in right knee: Secondary | ICD-10-CM

## 2017-10-12 DIAGNOSIS — M25562 Pain in left knee: Secondary | ICD-10-CM

## 2017-10-12 MED ORDER — METHYLPREDNISOLONE ACETATE 80 MG/ML IJ SUSP
80.0000 mg | Freq: Once | INTRAMUSCULAR | Status: AC
  Administered 2017-10-12: 80 mg via INTRA_ARTICULAR
  Filled 2017-10-12: qty 1

## 2017-10-12 MED ORDER — SODIUM HYALURONATE (VISCOSUP) 20 MG/2ML IX SOSY: 2.0000 mL | PREFILLED_SYRINGE | Freq: Once | INTRA_ARTICULAR | Status: DC

## 2017-10-12 MED ORDER — IOPAMIDOL (ISOVUE-M 200) INJECTION 41%
10.0000 mL | Freq: Once | INTRAMUSCULAR | Status: AC
  Administered 2017-10-12: 10 mL via INTRA_ARTICULAR
  Filled 2017-10-12: qty 10

## 2017-10-12 MED ORDER — ROPIVACAINE HCL 2 MG/ML IJ SOLN
4.0000 mL | Freq: Once | INTRAMUSCULAR | Status: AC
  Administered 2017-10-12: 4 mL via INTRA_ARTICULAR
  Filled 2017-10-12: qty 10

## 2017-10-12 MED ORDER — LIDOCAINE HCL (PF) 1 % IJ SOLN
4.0000 mL | Freq: Once | INTRAMUSCULAR | Status: AC
  Administered 2017-10-12: 4 mL
  Filled 2017-10-12: qty 5

## 2017-10-12 MED ORDER — ROPIVACAINE HCL 2 MG/ML IJ SOLN
4.0000 mL | Freq: Once | INTRAMUSCULAR | Status: AC
  Administered 2017-10-12: 4 mL via INTRA_ARTICULAR

## 2017-10-12 MED ORDER — LIDOCAINE HCL 2 % IJ SOLN
20.0000 mL | Freq: Once | INTRAMUSCULAR | Status: AC
  Administered 2017-10-12: 400 mg
  Filled 2017-10-12: qty 40

## 2017-10-12 MED ORDER — SODIUM HYALURONATE (VISCOSUP) 20 MG/2ML IX SOSY
2.0000 mL | PREFILLED_SYRINGE | Freq: Once | INTRA_ARTICULAR | Status: AC
  Administered 2017-10-12: 15:00:00 via INTRA_ARTICULAR

## 2017-10-12 NOTE — Progress Notes (Signed)
Patient's Name: Joshua Humphrey  MRN: [id number]  Referring Provider: Kirk Ruths, MD  DOB: 1929-05-22  PCP: Kirk Ruths, MD  DOS: 10/12/2017  Note by: Gaspar Cola, MD  Service setting: Ambulatory outpatient  Specialty: Interventional Pain Management  Patient type: Established  Location: [facility] (AMB) Pain Management Facility  Visit type: Interventional Procedure   Primary Reason for Visit: Interventional Pain Management Treatment. CC: Knee Pain (bilateral) and Hip Pain (left)  Procedure #1:  Anesthesia, Analgesia, Anxiolysis:  Type: Therapeutic Intra-Articular Hyalgan Knee Injection #1  Region: Lateral infrapatellar Knee Region Level: Knee Joint Laterality: Bilateral  Type: Local Anesthesia Indication(s): Analgesia         Local Anesthetic: Lidocaine 1-2% Route: Infiltration ([place]/IM) IV Access: Declined Sedation: Declined    Indications: 1. Osteoarthritis of knee (Bilateral)   2. Chronic knee pain (Bilateral) (L>R)    Procedure #2:  Anesthesia, Analgesia, Anxiolysis:  Type: Intra-Articular Hip Injection #2  Primary Purpose: Diagnostic Region: Posterolateral hip joint area. Level: Lower pelvic and hip joint level. Target Area: Superior aspect of the hip joint cavity, going thru the superior portion of the capsular ligament. Approach: Posterolateral approach. Laterality: Left-Sided  Type: Local Anesthesia Indication(s): Analgesia         Route: Infiltration ([place]/IM) IV Access: Declined Sedation: Declined  Local Anesthetic: Lidocaine 1-2%   Position: Prone Prepped Area: Entire Posterolateral hip area. Prepping solution: ChloraPrep (2% chlorhexidine gluconate and 70% isopropyl alcohol)   Indications: 1. Osteoarthritis of hip (Left)   2. Chronic hip arthralgia (Left)    Pain Score: Pre-procedure: 4 /10 Post-procedure: 4 /10  Pre-op Assessment:  Joshua Humphrey is a 84 y.o. (year old), male patient, seen today for interventional treatment. He  has a past  surgical history that includes tumor removed (2000); Prostate surgery (2002); lipoma removal (2000); Tonsillectomy; Cataract extraction, bilateral; Toe Surgery; Olecranon bursectomy (Left, 01/21/2016); Elbow surgery; and Eye surgery. Joshua Humphrey has a current medication list which includes the following prescription(s): acetaminophen, aspirin ec, atorvastatin, b-d ultrafine iii short pen, benzonatate, calcium carbonate-vitamin d, carvedilol, cetirizine, ferrous fumarate, flaxseed (linseed), fluticasone, furosemide, gabapentin, glucosamine-chondroitin, hydralazine, ipratropium-albuterol, levemir flextouch, losartan, magnesium oxide, metformin, mometasone-formoterol, montelukast, ocuvite adult 50+, nifedipine, omeprazole, potassium chloride sa, pramipexole, sitagliptin, torsemide, and tramadol, and the following Facility-Administered Medications: sodium hyaluronate. His primarily concern today is the Knee Pain (bilateral) and Hip Pain (left)  Initial Vital Signs:  Pulse/HCG Rate: 69  Temp: 98.8 F (37.1 C) Resp: 16 BP: 138/78 SpO2: 93 %  BMI: Estimated body mass index is 32.19 kg/m as calculated from the following:   Height as of this encounter: 5\' 9"  (1.753 m).   Weight as of this encounter: 218 lb (98.9 kg).  Risk Assessment: Allergies: Reviewed. He has No Known Allergies.  Allergy Precautions: None required Coagulopathies: Reviewed. None identified.  Blood-thinner therapy: None at this time Active Infection(s): Reviewed. None identified. Joshua Humphrey is afebrile  Site Confirmation: Joshua Humphrey was asked to confirm the procedure and laterality before marking the site Procedure checklist: Completed Consent: Before the procedure and under the influence of no sedative(s), amnesic(s), or anxiolytics, the patient was informed of the treatment options, risks and possible complications. To fulfill our ethical and legal obligations, as recommended by the American Medical Association's Code of Ethics, I have  informed the patient of my clinical impression; the nature and purpose of the treatment or procedure; the risks, benefits, and possible complications of the intervention; the alternatives, including doing nothing; the risk(s) and benefit(s) of the alternative  treatment(s) or procedure(s); and the risk(s) and benefit(s) of doing nothing. The patient was provided information about the general risks and possible complications associated with the procedure. These may include, but are not limited to: failure to achieve desired goals, infection, bleeding, organ or nerve damage, allergic reactions, paralysis, and death. In addition, the patient was informed of those risks and complications associated to the procedure, such as failure to decrease pain; infection; bleeding; organ or nerve damage with subsequent damage to sensory, motor, and/or autonomic systems, resulting in permanent pain, numbness, and/or weakness of one or several areas of the body; allergic reactions; (i.e.: anaphylactic reaction); and/or death. Furthermore, the patient was informed of those risks and complications associated with the medications. These include, but are not limited to: allergic reactions (i.e.: anaphylactic or anaphylactoid reaction(s)); adrenal axis suppression; blood sugar elevation that in diabetics may result in ketoacidosis or comma; water retention that in patients with history of congestive heart failure may result in shortness of breath, pulmonary edema, and decompensation with resultant heart failure; weight gain; swelling or edema; medication-induced neural toxicity; particulate matter embolism and blood vessel occlusion with resultant organ, and/or nervous system infarction; and/or aseptic necrosis of one or more joints. Finally, the patient was informed that Medicine is not an exact science; therefore, there is also the possibility of unforeseen or unpredictable risks and/or possible complications that may result in a  catastrophic outcome. The patient indicated having understood very clearly. We have given the patient no guarantees and we have made no promises. Enough time was given to the patient to ask questions, all of which were answered to the patient's satisfaction. Mr. Holycross has indicated that he wanted to continue with the procedure. Attestation: I, the ordering provider, attest that I have discussed with the patient the benefits, risks, side-effects, alternatives, likelihood of achieving goals, and potential problems during recovery for the procedure that I have provided informed consent. Date  Time: 10/12/2017  1:49 PM  Pre-Procedure Preparation:  Monitoring: As per clinic protocol. Respiration, ETCO2, SpO2, BP, heart rate and rhythm monitor placed and checked for adequate function Safety Precautions: Patient was assessed for positional comfort and pressure points before starting the procedure. Time-out: I initiated and conducted the "Time-out" before starting the procedure, as per protocol. The patient was asked to participate by confirming the accuracy of the "Time Out" information. Verification of the correct person, site, and procedure were performed and confirmed by me, the nursing staff, and the patient. "Time-out" conducted as per Joint Commission's Universal Protocol (UP.01.01.01). Time: 1448  Description of Procedure #1:  Position: Sitting Target Area: Knee Joint Approach: Just above the Lateral tibial plateau, lateral to the infrapatellar tendon. Area Prepped: Entire knee area, from the mid-thigh to the mid-shin. Prepping solution: ChloraPrep (2% chlorhexidine gluconate and 70% isopropyl alcohol) Safety Precautions: Aspiration looking for blood return was conducted prior to all injections. At no point did we inject any substances, as a needle was being advanced. No attempts were made at seeking any paresthesias. Safe injection practices and needle disposal techniques used. Medications properly  checked for expiration dates. SDV (single dose vial) medications used. Description of the Procedure: Protocol guidelines were followed. The patient was placed in position over the fluoroscopy table. The target area was identified and the area prepped in the usual manner. Skin & deeper tissues infiltrated with local anesthetic. Appropriate amount of time allowed to pass for local anesthetics to take effect. The procedure needles were then advanced to the target area. Proper needle placement  secured. Negative aspiration confirmed. Solution injected in intermittent fashion, asking for systemic symptoms every 0.5cc of injectate. The needles were then removed and the area cleansed, making sure to leave some of the prepping solution back to take advantage of its long term bactericidal properties.  Start Time: 1448 hrs.  Materials:  Needle(s) Type: Regular needle Gauge: 25G Length: 1.5-in Medication(s): Please see orders for medications and dosing details.  Imaging Guidance for procedure #1:  Type of Imaging Technique: None used Indication(s): N/A Exposure Time: No patient exposure Contrast: None used. Fluoroscopic Guidance: N/A Ultrasound Guidance: N/A Interpretation: N/A  Description of Procedure #2:  Safety Precautions: Aspiration looking for blood return was conducted prior to all injections. At no point did we inject any substances, as a needle was being advanced. No attempts were made at seeking any paresthesias. Safe injection practices and needle disposal techniques used. Medications properly checked for expiration dates. SDV (single dose vial) medications used. Description of the Procedure: Protocol guidelines were followed. The patient was placed in position over the fluoroscopy table. The target area was identified and the area prepped in the usual manner. Skin & deeper tissues infiltrated with local anesthetic. Appropriate amount of time allowed to pass for local anesthetics to take  effect. The procedure needles were then advanced to the target area. Proper needle placement secured. Negative aspiration confirmed. Solution injected in intermittent fashion, asking for systemic symptoms every 0.5cc of injectate. The needles were then removed and the area cleansed, making sure to leave some of the prepping solution back to take advantage of its long term bactericidal properties.  Vitals:   10/12/17 1448 10/12/17 1500 10/12/17 1505 10/12/17 1512  BP: (!) 150/104 (!) 149/86 (!) 133/92 (!) 146/88  Pulse: 64 64 64 65  Resp: 16 18 16 16   Temp:      SpO2: 93% 93% 93% 93%  Weight:      Height:       End Time: 1511 hrs. Materials:  Needle(s) Type: Regular needle Gauge: 22G Length: 7-in Medication(s): Please see orders for medications and dosing details.  Imaging Guidance (Non-Spinal) for procedure #2:  Type of Imaging Technique: Fluoroscopy Guidance (Non-Spinal) Indication(s): Assistance in needle guidance and placement for procedures requiring needle placement in or near specific anatomical locations not easily accessible without such assistance. Exposure Time: Please see nurses notes. Contrast: Before injecting any contrast, we confirmed that the patient did not have an allergy to iodine, shellfish, or radiological contrast. Once satisfactory needle placement was completed at the desired level, radiological contrast was injected. Contrast injected under live fluoroscopy. No contrast complications. See chart for type and volume of contrast used.  Fluoroscopic Guidance: I was personally present during the use of fluoroscopy. "Tunnel Vision Technique" used to obtain the best possible view of the target area. Parallax error corrected before commencing the procedure. "Direction-depth-direction" technique used to introduce the needle under continuous pulsed fluoroscopy. Once target was reached, antero-posterior, oblique, and lateral fluoroscopic projection used confirm needle placement  in all planes. Images permanently stored in EMR.       Interpretation: I personally interpreted the imaging intraoperatively. Adequate needle placement confirmed in multiple planes. Appropriate spread of contrast into desired area was observed. No evidence of afferent or efferent intravascular uptake. Permanent images saved into the patient's record.  Antibiotic Prophylaxis:   Anti-infectives (From admission, onward)   None     Indication(s): None identified  Post-operative Assessment:  Post-procedure Vital Signs:  Pulse/HCG Rate: 65  Temp: 98.8 F (37.1 C)  Resp: 16 BP: (!) 146/88 SpO2: 93 %  EBL: None  Complications: No immediate post-treatment complications observed by team, or reported by patient.  Note: The patient tolerated the entire procedure well. A repeat set of vitals were taken after the procedure and the patient was kept under observation following institutional policy, for this type of procedure. Post-procedural neurological assessment was performed, showing return to baseline, prior to discharge. The patient was provided with post-procedure discharge instructions, including a section on how to identify potential problems. Should any problems arise concerning this procedure, the patient was given instructions to immediately contact us, at any time, without hesitation. In any case, we plan to contact the patient by telephone for a follow-up status report regarding this interventional procedure.  Comments:  No additional relevant information.  Plan of Care   Imaging Orders     DG C-Arm 1-60 Min-No Report  Procedure Orders     KNEE INJECTION     KNEE INJECTION     HIP INJECTION  Medications ordered for procedure: Meds ordered this encounter  Medications  . lidocaine (XYLOCAINE) 2 % (with pres) injection 400 mg  . lidocaine (PF) (XYLOCAINE) 1 % injection 4 mL  . ropivacaine (PF) 2 mg/mL (0.2%) (NAROPIN) injection 4 mL  . Sodium Hyaluronate SOSY 2 mL  . Sodium  Hyaluronate SOSY 2 mL  . ropivacaine (PF) 2 mg/mL (0.2%) (NAROPIN) injection 4 mL  . methylPREDNISolone acetate (DEPO-MEDROL) injection 80 mg  . iopamidol (ISOVUE-M) 41 % intrathecal injection 10 mL   Medications administered: We administered lidocaine, lidocaine (PF), ropivacaine (PF) 2 mg/mL (0.2%), Sodium Hyaluronate, ropivacaine (PF) 2 mg/mL (0.2%), methylPREDNISolone acetate, and iopamidol.  See the medical record for exact dosing, route, and time of administration.  New Prescriptions   No medications on file   Disposition: Discharge home  Discharge Date & Time: 10/12/2017; 1525 hrs.   Physician-requested Follow-up: Return for PPE (2 wks) + Procedure (no sedation): (B) Hyalgan #2.  Future Appointments  Date Time Provider [place]  10/26/2017  1:45 PM Milinda Pointer, MD [facility] None  12/23/2017 11:00 AM Vevelyn Francois, NP [facility] None  05/02/2018  2:15 PM Stoioff, Ronda Fairly, MD [facility] None   Primary Care Physician: Kirk Ruths, MD Location: [facility] Note by: Gaspar Cola, MD Date: 10/12/2017; Time: 3:29 PM  Disclaimer:  Medicine is not an Chief Strategy Officer. The only guarantee in medicine is that nothing is guaranteed. It is important to note that the decision to proceed with this intervention was based on the information collected from the patient. The Data and conclusions were drawn from the patient's questionnaire, the interview, and the physical examination. Because the information was provided in large part by the patient, it cannot be guaranteed that it has not been purposely or unconsciously manipulated. Every effort has been made to obtain as much relevant data as possible for this evaluation. It is important to note that the conclusions that lead to this procedure are derived in large part from the available data. Always take into account that the treatment will also be dependent on availability of resources and existing  treatment guidelines, considered by other Pain Management Practitioners as being common knowledge and practice, at the time of the intervention. For Medico-Legal purposes, it is also important to point out that variation in procedural techniques and pharmacological choices are the acceptable norm. The indications, contraindications, technique, and results of the above procedure should only be interpreted and judged by a Board-Certified  Interventional Pain Specialist with extensive familiarity and expertise in the same exact procedure and technique.

## 2017-10-12 NOTE — Patient Instructions (Addendum)
____________________________________________________________________________________________  Post-Procedure Discharge Instructions  Instructions:  Apply ice: Fill a plastic sandwich bag with crushed ice. Cover it with a small towel and apply to injection site. Apply for 15 minutes then remove x 15 minutes. Repeat sequence on day of procedure, until you go to bed. The purpose is to minimize swelling and discomfort after procedure.  Apply heat: Apply heat to procedure site starting the day following the procedure. The purpose is to treat any soreness and discomfort from the procedure.  Food intake: Start with clear liquids (like water) and advance to regular food, as tolerated.   Physical activities: Keep activities to a minimum for the first 8 hours after the procedure.   Driving: If you have received any sedation, you are not allowed to drive for 24 hours after your procedure.  Blood thinner: Restart your blood thinner 6 hours after your procedure. (Only for those taking blood thinners)  Insulin: As soon as you can eat, you may resume your normal dosing schedule. (Only for those taking insulin)  Infection prevention: Keep procedure site clean and dry.  Post-procedure Pain Diary: Extremely important that this be done correctly and accurately. Recorded information will be used to determine the next step in treatment.  Pain evaluated is that of treated area only. Do not include pain from an untreated area.  Complete every hour, on the hour, for the initial 8 hours. Set an alarm to help you do this part accurately.  Do not go to sleep and have it completed later. It will not be accurate.  Follow-up appointment: Keep your follow-up appointment after the procedure. Usually 2 weeks for most procedures. (6 weeks in the case of radiofrequency.) Bring you pain diary.   Expect:  From numbing medicine (AKA: Local Anesthetics): Numbness or decrease in pain.  Onset: Full effect within 15  minutes of injected.  Duration: It will depend on the type of local anesthetic used. On the average, 1 to 8 hours.   From steroids: Decrease in swelling or inflammation. Once inflammation is improved, relief of the pain will follow.  Onset of benefits: Depends on the amount of swelling present. The more swelling, the longer it will take for the benefits to be seen. In some cases, up to 10 days.  Duration: Steroids will stay in the system x 2 weeks. Duration of benefits will depend on multiple posibilities including persistent irritating factors.  From procedure: Some discomfort is to be expected once the numbing medicine wears off. This should be minimal if ice and heat are applied as instructed.  Call if:  You experience numbness and weakness that gets worse with time, as opposed to wearing off.  New onset bowel or bladder incontinence. (This applies to Spinal procedures only)  Emergency Numbers:  Durning business hours (Monday - Thursday, 8:00 AM - 4:00 PM) (Friday, 9:00 AM - 12:00 Noon): [phone]  After hours: [phone] ____________________________________________________________________________________________   ____________________________________________________________________________________________  Preparing for your procedure (without sedation)  Instructions: . Oral Intake: Do not eat or drink anything for at least 3 hours prior to your procedure. . Transportation: Unless otherwise stated by your physician, you may drive yourself after the procedure. . Blood Pressure Medicine: Take your blood pressure medicine with a sip of water the morning of the procedure. . Blood thinners: Notify our staff if you are taking any blood thinners. Depending on which one you take, there will be specific instructions on how and when to stop it. . Diabetics on insulin: Notify the  staff so that you can be scheduled 1st case in the morning. If your diabetes requires high dose  insulin, take only  of your normal insulin dose the morning of the procedure and notify the staff that you have done so. . Preventing infections: Shower with an antibacterial soap the morning of your procedure.  . Build-up your immune system: Take 1000 mg of Vitamin C with every meal (3 times a day) the day prior to your procedure. Marland Kitchen Antibiotics: Inform the staff if you have a condition or reason that requires you to take antibiotics before dental procedures. . Pregnancy: If you are pregnant, call and cancel the procedure. . Sickness: If you have a cold, fever, or any active infections, call and cancel the procedure. . Arrival: You must be in the facility at least 30 minutes prior to your scheduled procedure. . Children: Do not bring any children with you. . Dress appropriately: Bring dark clothing that you would not mind if they get stained. . Valuables: Do not bring any jewelry or valuables.  Procedure appointments are reserved for interventional treatments only. Marland Kitchen No Prescription Refills. . No medication changes will be discussed during procedure appointments. . No disability issues will be discussed.  Reasons to call and reschedule or cancel your procedure: (Following these recommendations will minimize the risk of a serious complication.) . Surgeries: Avoid having procedures within 2 weeks of any surgery. (Avoid for 2 weeks before or after any surgery). . Flu Shots: Avoid having procedures within 2 weeks of a flu shots or . (Avoid for 2 weeks before or after immunizations). . Barium: Avoid having a procedure within 7-10 days after having had a radiological study involving the use of radiological contrast. (Myelograms, Barium swallow or enema study). . Heart attacks: Avoid any elective procedures or surgeries for the initial 6 months after a "Myocardial Infarction" (Heart Attack). . Blood thinners: It is imperative that you stop these medications before procedures. Let us know if you if you  take any blood thinner.  . Infection: Avoid procedures during or within two weeks of an infection (including chest colds or gastrointestinal problems). Symptoms associated with infections include: Localized redness, fever, chills, night sweats or profuse sweating, burning sensation when voiding, cough, congestion, stuffiness, runny nose, sore throat, diarrhea, nausea, vomiting, cold or Flu symptoms, recent or current infections. It is specially important if the infection is over the area that we intend to treat. Marland Kitchen Heart and lung problems: Symptoms that may suggest an active cardiopulmonary problem include: cough, chest pain, breathing difficulties or shortness of breath, dizziness, ankle swelling, uncontrolled high or unusually low blood pressure, and/or palpitations. If you are experiencing any of these symptoms, cancel your procedure and contact your primary care physician for an evaluation.  Remember:  Regular Business hours are:  Monday to Thursday 8:00 AM to 4:00 PM  Provider's Schedule: Milinda Pointer, MD:  Procedure days: Tuesday and Thursday 7:30 AM to 4:00 PM  Gillis Santa, MD:  Procedure days: Monday and Wednesday 7:30 AM to 4:00 PM ____________________________________________________________________________________________   Pain Management Discharge Instructions  General Discharge Instructions :  If you need to reach your doctor call: Monday-Friday 8:00 am - 4:00 pm at [phone] or toll free [phone].  After clinic hours [phone] to have operator reach doctor.  Bring all of your medication bottles to all your appointments in the pain clinic.  To cancel or reschedule your appointment with Pain Management please remember to call 24 hours in advance to avoid a fee.  Refer to the educational materials which you have been given on: General Risks, I had my Procedure. Discharge Instructions, Post Sedation.  Post Procedure Instructions:  The drugs you were given  will stay in your system until tomorrow, so for the next 24 hours you should not drive, make any legal decisions or drink any alcoholic beverages.  You may eat anything you prefer, but it is better to start with liquids then soups and crackers, and gradually work up to solid foods.  Please notify your doctor immediately if you have any unusual bleeding, trouble breathing or pain that is not related to your normal pain.  Depending on the type of procedure that was done, some parts of your body may feel week and/or numb.  This usually clears up by tonight or the next day.  Walk with the use of an assistive device or accompanied by an adult for the 24 hours.  You may use ice on the affected area for the first 24 hours.  Put ice in a Ziploc bag and cover with a towel and place against area 15 minutes on 15 minutes off.  You may switch to heat after 24 hours.

## 2017-10-13 ENCOUNTER — Telehealth: Payer: Self-pay | Admitting: *Deleted

## 2017-10-13 NOTE — Telephone Encounter (Signed)
No answer.Post procedure voicemail left.

## 2017-10-26 ENCOUNTER — Ambulatory Visit: Payer: Medicare Other | Attending: Pain Medicine | Admitting: Pain Medicine

## 2017-10-26 ENCOUNTER — Encounter: Payer: Self-pay | Admitting: Pain Medicine

## 2017-10-26 ENCOUNTER — Telehealth: Payer: Self-pay | Admitting: *Deleted

## 2017-10-26 ENCOUNTER — Other Ambulatory Visit: Payer: Self-pay

## 2017-10-26 VITALS — BP 139/90 | HR 74 | Temp 98.5°F | Resp 18 | Ht 69.0 in | Wt 218.0 lb

## 2017-10-26 DIAGNOSIS — M25561 Pain in right knee: Secondary | ICD-10-CM | POA: Insufficient documentation

## 2017-10-26 DIAGNOSIS — G8929 Other chronic pain: Secondary | ICD-10-CM

## 2017-10-26 DIAGNOSIS — M170 Bilateral primary osteoarthritis of knee: Secondary | ICD-10-CM | POA: Insufficient documentation

## 2017-10-26 DIAGNOSIS — M25562 Pain in left knee: Secondary | ICD-10-CM | POA: Insufficient documentation

## 2017-10-26 MED ORDER — SODIUM HYALURONATE (VISCOSUP) 20 MG/2ML IX SOSY
2.0000 mL | PREFILLED_SYRINGE | Freq: Once | INTRA_ARTICULAR | Status: AC
  Administered 2017-10-26: 2 mL via INTRA_ARTICULAR

## 2017-10-26 MED ORDER — ROPIVACAINE HCL 2 MG/ML IJ SOLN
4.0000 mL | Freq: Once | INTRAMUSCULAR | Status: AC
  Administered 2017-10-26: 4 mL via INTRA_ARTICULAR
  Filled 2017-10-26: qty 10

## 2017-10-26 MED ORDER — LIDOCAINE HCL (PF) 1 % IJ SOLN
4.0000 mL | Freq: Once | INTRAMUSCULAR | Status: AC
  Administered 2017-10-26: 4 mL
  Filled 2017-10-26: qty 5

## 2017-10-26 MED ORDER — SODIUM HYALURONATE (VISCOSUP) 20 MG/2ML IX SOSY
2.0000 mL | PREFILLED_SYRINGE | Freq: Once | INTRA_ARTICULAR | Status: AC
  Administered 2017-10-26: 14:00:00 via INTRA_ARTICULAR

## 2017-10-26 NOTE — Progress Notes (Signed)
Patient's Name: Joshua Humphrey  MRN: [id number]  Referring Provider: Kirk Ruths, MD  DOB: 05-18-1929  PCP: Kirk Ruths, MD  DOS: 10/26/2017  Note by: Gaspar Cola, MD  Service setting: Ambulatory outpatient  Specialty: Interventional Pain Management  Patient type: Established  Location: [facility] (AMB) Pain Management Facility  Visit type: Interventional Procedure   Primary Reason for Visit: Interventional Pain Management Treatment. CC: Knee Pain (bilateral, right worse than left)  Procedure:          Anesthesia, Analgesia, Anxiolysis:  Type: Therapeutic Intra-Articular Hyalgan Knee Injection #2  Region: Lateral infrapatellar Knee Region Level: Knee Joint Laterality: Bilateral  Type: Local Anesthesia Indication(s): Analgesia         Local Anesthetic: Lidocaine 1-2% Route: Infiltration ([place]/IM) IV Access: Declined Sedation: Declined    Indications: 1. Osteoarthritis of knee (Bilateral)   2. Chronic knee pain (Bilateral) (L>R)    Pain Score: Pre-procedure: 4 /10 Post-procedure: 0-No pain/10  Evaluation of last interventional procedure  10/12/2017 Procedure: Diagnostic left intra-articular hip joint injection #2 + therapeutic bilateral Hyalgan knee injection #1 under fluoroscopic guidance for the hip, no sedation. Pre-procedure pain score:  4/10 Post-procedure pain score: 4/10 No initial benefit, possible due to rapid discharge after no sedation procedure, without enough time to allow full onset of block. Influential Factors: Intra-procedural challenges: None observed.         Reported side-effects: None.        Post-procedural adverse reactions or complications: None reported         Sedation: No sedation used. When no sedatives are used, the analgesic levels obtained are directly associated to the effectiveness of the local anesthetics. However, when sedation is provided, the level of analgesia obtained during the initial 1 hour following the intervention, is  believed to be the result of a combination of factors. These factors may include, but are not limited to: 1. The effectiveness of the local anesthetics used. 2. The effects of the analgesic(s) and/or anxiolytic(s) used. 3. The degree of discomfort experienced by the patient at the time of the procedure. 4. The patients ability and reliability in recalling and recording the events. 5. The presence and influence of possible secondary gains and/or psychosocial factors. Reported result: Relief experienced during the 1st hour after the procedure: (left knee 100%  right knee 75%  hip 75%) (Ultra-Short Term Relief)            Interpretative annotation: Clinically appropriate result. No IV Analgesic or Anxiolytic given, therefore benefits are completely due to Local Anesthetic effects.          Effects of local anesthetic: The analgesic effects attained during this period are directly associated to the localized infiltration of local anesthetics and therefore cary significant diagnostic value as to the etiological location, or anatomical origin, of the pain. Expected duration of relief is directly dependent on the pharmacodynamics of the local anesthetic used. Long-acting (4-6 hours) anesthetics used.  Reported result: Relief during the next 4 to 6 hour after the procedure: (left knee 100%  right knee 50%, hip 50%) (Short-Term Relief)            Interpretative annotation: Clinically appropriate result. Analgesia during this period is likely to be Local Anesthetic-related.          Long-term benefit: Defined as the period of time past the expected duration of local anesthetics (1 hour for short-acting and 4-6 hours for long-acting). With the possible exception of prolonged sympathetic blockade from the local  anesthetics, benefits during this period are typically attributed to, or associated with, other factors such as analgesic sensory neuropraxia, antiinflammatory effects, or beneficial biochemical changes  provided by agents other than the local anesthetics.  Reported result: Extended relief following procedure: (left knee 80%, right knee 30%  hip 0%) (Long-Term Relief)            Interpretative annotation: Clinically appropriate result. Good relief. No permanent benefit expected. Inflammation plays a part in the etiology to the pain.          Pre-op Assessment:  Joshua Humphrey is a 84 y.o. (year old), male patient, seen today for interventional treatment. He  has a past surgical history that includes tumor removed (2000); Prostate surgery (2002); lipoma removal (2000); Tonsillectomy; Cataract extraction, bilateral; Toe Surgery; Olecranon bursectomy (Left, 01/21/2016); Elbow surgery; and Eye surgery. Joshua Humphrey has a current medication list which includes the following prescription(s): acetaminophen, aspirin ec, atorvastatin, b-d ultrafine iii short pen, calcium carbonate-vitamin d, carvedilol, cetirizine, ferrous fumarate, flaxseed (linseed), fluticasone, furosemide, gabapentin, glucosamine-chondroitin, hydralazine, ipratropium-albuterol, levemir flextouch, losartan, magnesium oxide, metformin, mometasone-formoterol, montelukast, ocuvite adult 50+, nifedipine, omeprazole, potassium chloride sa, pramipexole, sitagliptin, torsemide, tramadol, and benzonatate. His primarily concern today is the Knee Pain (bilateral, right worse than left)  Initial Vital Signs:  Pulse/HCG Rate: 77  Temp: 98.5 F (36.9 C) Resp: 16 BP: (!) 146/78 SpO2: 93 %  BMI: Estimated body mass index is 32.19 kg/m as calculated from the following:   Height as of this encounter: 5\' 9"  (1.753 m).   Weight as of this encounter: 218 lb (98.9 kg).  Risk Assessment: Allergies: Reviewed. He has No Known Allergies.  Allergy Precautions: None required Coagulopathies: Reviewed. None identified.  Blood-thinner therapy: None at this time Active Infection(s): Reviewed. None identified. Joshua Humphrey is afebrile  Site Confirmation: Joshua Humphrey was  asked to confirm the procedure and laterality before marking the site Procedure checklist: Completed Consent: Before the procedure and under the influence of no sedative(s), amnesic(s), or anxiolytics, the patient was informed of the treatment options, risks and possible complications. To fulfill our ethical and legal obligations, as recommended by the American Medical Association's Code of Ethics, I have informed the patient of my clinical impression; the nature and purpose of the treatment or procedure; the risks, benefits, and possible complications of the intervention; the alternatives, including doing nothing; the risk(s) and benefit(s) of the alternative treatment(s) or procedure(s); and the risk(s) and benefit(s) of doing nothing. The patient was provided information about the general risks and possible complications associated with the procedure. These may include, but are not limited to: failure to achieve desired goals, infection, bleeding, organ or nerve damage, allergic reactions, paralysis, and death. In addition, the patient was informed of those risks and complications associated to the procedure, such as failure to decrease pain; infection; bleeding; organ or nerve damage with subsequent damage to sensory, motor, and/or autonomic systems, resulting in permanent pain, numbness, and/or weakness of one or several areas of the body; allergic reactions; (i.e.: anaphylactic reaction); and/or death. Furthermore, the patient was informed of those risks and complications associated with the medications. These include, but are not limited to: allergic reactions (i.e.: anaphylactic or anaphylactoid reaction(s)); adrenal axis suppression; blood sugar elevation that in diabetics may result in ketoacidosis or comma; water retention that in patients with history of congestive heart failure may result in shortness of breath, pulmonary edema, and decompensation with resultant heart failure; weight gain; swelling  or edema; medication-induced neural toxicity; particulate matter embolism  and blood vessel occlusion with resultant organ, and/or nervous system infarction; and/or aseptic necrosis of one or Humphrey joints. Finally, the patient was informed that Medicine is not an exact science; therefore, there is also the possibility of unforeseen or unpredictable risks and/or possible complications that may result in a catastrophic outcome. The patient indicated having understood very clearly. We have given the patient no guarantees and we have made no promises. Enough time was given to the patient to ask questions, all of which were answered to the patient's satisfaction. Mr. Boyar has indicated that he wanted to continue with the procedure. Attestation: I, the ordering provider, attest that I have discussed with the patient the benefits, risks, side-effects, alternatives, likelihood of achieving goals, and potential problems during recovery for the procedure that I have provided informed consent. Date  Time: 10/26/2017  1:52 PM  Pre-Procedure Preparation:  Monitoring: As per clinic protocol. Respiration, ETCO2, SpO2, BP, heart rate and rhythm monitor placed and checked for adequate function Safety Precautions: Patient was assessed for positional comfort and pressure points before starting the procedure. Time-out: I initiated and conducted the "Time-out" before starting the procedure, as per protocol. The patient was asked to participate by confirming the accuracy of the "Time Out" information. Verification of the correct person, site, and procedure were performed and confirmed by me, the nursing staff, and the patient. "Time-out" conducted as per Joint Commission's Universal Protocol (UP.01.01.01). Time: 1402  Description of Procedure:          Position: Sitting Target Area: Knee Joint Approach: Just above the Lateral tibial plateau, lateral to the infrapatellar tendon. Area Prepped: Entire knee area, from the  mid-thigh to the mid-shin. Prepping solution: ChloraPrep (2% chlorhexidine gluconate and 70% isopropyl alcohol) Safety Precautions: Aspiration looking for blood return was conducted prior to all injections. At no point did we inject any substances, as a needle was being advanced. No attempts were made at seeking any paresthesias. Safe injection practices and needle disposal techniques used. Medications properly checked for expiration dates. SDV (single dose vial) medications used. Description of the Procedure: Protocol guidelines were followed. The patient was placed in position over the fluoroscopy table. The target area was identified and the area prepped in the usual manner. Skin & deeper tissues infiltrated with local anesthetic. Appropriate amount of time allowed to pass for local anesthetics to take effect. The procedure needles were then advanced to the target area. Proper needle placement secured. Negative aspiration confirmed. Solution injected in intermittent fashion, asking for systemic symptoms every 0.5cc of injectate. The needles were then removed and the area cleansed, making sure to leave some of the prepping solution back to take advantage of its long term bactericidal properties. Vitals:   10/26/17 1350 10/26/17 1400  BP: (!) 146/78 139/90  Pulse: 77 74  Resp: 16 18  Temp: 98.5 F (36.9 C)   SpO2: 93% 95%  Weight: 218 lb (98.9 kg)   Height: 5\' 9"  (1.753 m)     Start Time: 1402 hrs. End Time: 1408 hrs. Materials:  Needle(s) Type: Regular needle Gauge: 22G Length: 3.5-in Medication(s): Please see orders for medications and dosing details.  Imaging Guidance:          Type of Imaging Technique: None used Indication(s): N/A Exposure Time: No patient exposure Contrast: None used. Fluoroscopic Guidance: N/A Ultrasound Guidance: N/A Interpretation: N/A  Antibiotic Prophylaxis:   Anti-infectives (From admission, onward)   None     Indication(s): None  identified  Post-operative Assessment:  Post-procedure Vital  Signs:  Pulse/HCG Rate: 74  Temp: 98.5 F (36.9 C) Resp: 18 BP: 139/90 SpO2: 95 %  EBL: None  Complications: No immediate post-treatment complications observed by team, or reported by patient.  Note: The patient tolerated the entire procedure well. A repeat set of vitals were taken after the procedure and the patient was kept under observation following institutional policy, for this type of procedure. Post-procedural neurological assessment was performed, showing return to baseline, prior to discharge. The patient was provided with post-procedure discharge instructions, including a section on how to identify potential problems. Should any problems arise concerning this procedure, the patient was given instructions to immediately contact us, at any time, without hesitation. In any case, we plan to contact the patient by telephone for a follow-up status report regarding this interventional procedure.  Comments:  No additional relevant information.  Plan of Care   Imaging Orders  No imaging studies ordered today    Procedure Orders     KNEE INJECTION     KNEE INJECTION  Medications ordered for procedure: Meds ordered this encounter  Medications  . lidocaine (PF) (XYLOCAINE) 1 % injection 4 mL  . ropivacaine (PF) 2 mg/mL (0.2%) (NAROPIN) injection 4 mL  . Sodium Hyaluronate SOSY 2 mL  . Sodium Hyaluronate SOSY 2 mL   Medications administered: We administered lidocaine (PF), ropivacaine (PF) 2 mg/mL (0.2%), Sodium Hyaluronate, and Sodium Hyaluronate.  See the medical record for exact dosing, route, and time of administration.  New Prescriptions   No medications on file   Disposition: Discharge home  Discharge Date & Time: 10/26/2017; 1415 hrs.   Physician-requested Follow-up: Return for Procedure (no sedation): (B) Hyalgan #3.  Future Appointments  Date Time Provider [place]  11/15/2017  1:45 PM  Milinda Pointer, MD [facility] None  12/23/2017 11:00 AM Vevelyn Francois, NP [facility] None  05/02/2018  2:15 PM Stoioff, Ronda Fairly, MD [facility] None   Primary Care Physician: Kirk Ruths, MD Location: [facility] Note by: Gaspar Cola, MD Date: 10/26/2017; Time: 2:33 PM  Disclaimer:  Medicine is not an Chief Strategy Officer. The only guarantee in medicine is that nothing is guaranteed. It is important to note that the decision to proceed with this intervention was based on the information collected from the patient. The Data and conclusions were drawn from the patient's questionnaire, the interview, and the physical examination. Because the information was provided in large part by the patient, it cannot be guaranteed that it has not been purposely or unconsciously manipulated. Every effort has been made to obtain as much relevant data as possible for this evaluation. It is important to note that the conclusions that lead to this procedure are derived in large part from the available data. Always take into account that the treatment will also be dependent on availability of resources and existing treatment guidelines, considered by other Pain Management Practitioners as being common knowledge and practice, at the time of the intervention. For Medico-Legal purposes, it is also important to point out that variation in procedural techniques and pharmacological choices are the acceptable norm. The indications, contraindications, technique, and results of the above procedure should only be interpreted and judged by a Board-Certified Interventional Pain Specialist with extensive familiarity and expertise in the same exact procedure and technique.

## 2017-10-26 NOTE — Patient Instructions (Signed)

## 2017-10-27 ENCOUNTER — Telehealth: Payer: Self-pay | Admitting: *Deleted

## 2017-10-27 DIAGNOSIS — I5032 Chronic diastolic (congestive) heart failure: Secondary | ICD-10-CM | POA: Insufficient documentation

## 2017-10-27 DIAGNOSIS — Z9989 Dependence on other enabling machines and devices: Secondary | ICD-10-CM | POA: Insufficient documentation

## 2017-10-27 DIAGNOSIS — I350 Nonrheumatic aortic (valve) stenosis: Secondary | ICD-10-CM | POA: Insufficient documentation

## 2017-10-27 NOTE — Telephone Encounter (Signed)
Voicemail left for patient to call if there are questions or concerns re knee injection on yesterday.

## 2017-11-15 ENCOUNTER — Ambulatory Visit: Payer: Medicare Other | Admitting: Pain Medicine

## 2017-11-16 ENCOUNTER — Ambulatory Visit: Payer: Medicare Other | Attending: Pain Medicine | Admitting: Pain Medicine

## 2017-11-16 ENCOUNTER — Encounter: Payer: Self-pay | Admitting: Pain Medicine

## 2017-11-16 ENCOUNTER — Other Ambulatory Visit: Payer: Self-pay

## 2017-11-16 VITALS — BP 149/88 | HR 80 | Temp 98.6°F | Resp 16 | Ht 69.0 in | Wt 218.0 lb

## 2017-11-16 DIAGNOSIS — M25561 Pain in right knee: Secondary | ICD-10-CM | POA: Insufficient documentation

## 2017-11-16 DIAGNOSIS — M25562 Pain in left knee: Secondary | ICD-10-CM | POA: Insufficient documentation

## 2017-11-16 DIAGNOSIS — G8929 Other chronic pain: Secondary | ICD-10-CM | POA: Diagnosis not present

## 2017-11-16 DIAGNOSIS — M170 Bilateral primary osteoarthritis of knee: Secondary | ICD-10-CM | POA: Insufficient documentation

## 2017-11-16 MED ORDER — SODIUM HYALURONATE (VISCOSUP) 20 MG/2ML IX SOSY
2.0000 mL | PREFILLED_SYRINGE | Freq: Once | INTRA_ARTICULAR | Status: AC
  Administered 2017-11-16: 2 mL via INTRA_ARTICULAR

## 2017-11-16 MED ORDER — LIDOCAINE HCL (PF) 1 % IJ SOLN
4.0000 mL | Freq: Once | INTRAMUSCULAR | Status: AC
  Administered 2017-11-16: 5 mL
  Filled 2017-11-16: qty 5

## 2017-11-16 MED ORDER — ROPIVACAINE HCL 2 MG/ML IJ SOLN
4.0000 mL | Freq: Once | INTRAMUSCULAR | Status: AC
  Administered 2017-11-16: 10 mL via INTRA_ARTICULAR
  Filled 2017-11-16: qty 10

## 2017-11-16 NOTE — Patient Instructions (Signed)
____________________________________________________________________________________________  Post-Procedure Discharge Instructions  Instructions:  Apply ice: Fill a plastic sandwich bag with crushed ice. Cover it with a small towel and apply to injection site. Apply for 15 minutes then remove x 15 minutes. Repeat sequence on day of procedure, until you go to bed. The purpose is to minimize swelling and discomfort after procedure.  Apply heat: Apply heat to procedure site starting the day following the procedure. The purpose is to treat any soreness and discomfort from the procedure.  Food intake: Start with clear liquids (like water) and advance to regular food, as tolerated.   Physical activities: Keep activities to a minimum for the first 8 hours after the procedure.   Driving: If you have received any sedation, you are not allowed to drive for 24 hours after your procedure.  Blood thinner: Restart your blood thinner 6 hours after your procedure. (Only for those taking blood thinners)  Insulin: As soon as you can eat, you may resume your normal dosing schedule. (Only for those taking insulin)  Infection prevention: Keep procedure site clean and dry.  Post-procedure Pain Diary: Extremely important that this be done correctly and accurately. Recorded information will be used to determine the next step in treatment.  Pain evaluated is that of treated area only. Do not include pain from an untreated area.  Complete every hour, on the hour, for the initial 8 hours. Set an alarm to help you do this part accurately.  Do not go to sleep and have it completed later. It will not be accurate.  Follow-up appointment: Keep your follow-up appointment after the procedure. Usually 2 weeks for most procedures. (6 weeks in the case of radiofrequency.) Bring you pain diary.   Expect:  From numbing medicine (AKA: Local Anesthetics): Numbness or decrease in pain.  Onset: Full effect within 15  minutes of injected.  Duration: It will depend on the type of local anesthetic used. On the average, 1 to 8 hours.   From steroids: Decrease in swelling or inflammation. Once inflammation is improved, relief of the pain will follow.  Onset of benefits: Depends on the amount of swelling present. The more swelling, the longer it will take for the benefits to be seen. In some cases, up to 10 days.  Duration: Steroids will stay in the system x 2 weeks. Duration of benefits will depend on multiple posibilities including persistent irritating factors.  Occasional side-effects: Facial flushing, cramps (if present, drink Gatorade and take over-the-counter Magnesium 450-500 mg once to twice a day).  From procedure: Some discomfort is to be expected once the numbing medicine wears off. This should be minimal if ice and heat are applied as instructed.  Call if:  You experience numbness and weakness that gets worse with time, as opposed to wearing off.  New onset bowel or bladder incontinence. (This applies to Spinal procedures only)  Emergency Numbers:  Durning business hours (Monday - Thursday, 8:00 AM - 4:00 PM) (Friday, 9:00 AM - 12:00 Noon): [phone]  After hours: [phone] ____________________________________________________________________________________________   ____________________________________________________________________________________________  Preparing for your procedure (without sedation)  Instructions: . Oral Intake: Do not eat or drink anything for at least 3 hours prior to your procedure. . Transportation: Unless otherwise stated by your physician, you may drive yourself after the procedure. . Blood Pressure Medicine: Take your blood pressure medicine with a sip of water the morning of the procedure. . Blood thinners: Notify our staff if you are taking any blood thinners. Depending on which   one you take, there will be specific instructions on how and when  to stop it. . Diabetics on insulin: Notify the staff so that you can be scheduled 1st case in the morning. If your diabetes requires high dose insulin, take only  of your normal insulin dose the morning of the procedure and notify the staff that you have done so. . Preventing infections: Shower with an antibacterial soap the morning of your procedure.  . Build-up your immune system: Take 1000 mg of Vitamin C with every meal (3 times a day) the day prior to your procedure. . Antibiotics: Inform the staff if you have a condition or reason that requires you to take antibiotics before dental procedures. . Pregnancy: If you are pregnant, call and cancel the procedure. . Sickness: If you have a cold, fever, or any active infections, call and cancel the procedure. . Arrival: You must be in the facility at least 30 minutes prior to your scheduled procedure. . Children: Do not bring any children with you. . Dress appropriately: Bring dark clothing that you would not mind if they get stained. . Valuables: Do not bring any jewelry or valuables.  Procedure appointments are reserved for interventional treatments only. . No Prescription Refills. . No medication changes will be discussed during procedure appointments. . No disability issues will be discussed.  Reasons to call and reschedule or cancel your procedure: (Following these recommendations will minimize the risk of a serious complication.) . Surgeries: Avoid having procedures within 2 weeks of any surgery. (Avoid for 2 weeks before or after any surgery). . Flu Shots: Avoid having procedures within 2 weeks of a flu shots or . (Avoid for 2 weeks before or after immunizations). . Barium: Avoid having a procedure within 7-10 days after having had a radiological study involving the use of radiological contrast. (Myelograms, Barium swallow or enema study). . Heart attacks: Avoid any elective procedures or surgeries for the initial 6 months after a  "Myocardial Infarction" (Heart Attack). . Blood thinners: It is imperative that you stop these medications before procedures. Let us know if you if you take any blood thinner.  . Infection: Avoid procedures during or within two weeks of an infection (including chest colds or gastrointestinal problems). Symptoms associated with infections include: Localized redness, fever, chills, night sweats or profuse sweating, burning sensation when voiding, cough, congestion, stuffiness, runny nose, sore throat, diarrhea, nausea, vomiting, cold or Flu symptoms, recent or current infections. It is specially important if the infection is over the area that we intend to treat. . Heart and lung problems: Symptoms that may suggest an active cardiopulmonary problem include: cough, chest pain, breathing difficulties or shortness of breath, dizziness, ankle swelling, uncontrolled high or unusually low blood pressure, and/or palpitations. If you are experiencing any of these symptoms, cancel your procedure and contact your primary care physician for an evaluation.  Remember:  Regular Business hours are:  Monday to Thursday 8:00 AM to 4:00 PM  Provider's Schedule: Jaxyn Rout, MD:  Procedure days: Tuesday and Thursday 7:30 AM to 4:00 PM  Bilal Lateef, MD:  Procedure days: Monday and Wednesday 7:30 AM to 4:00 PM ____________________________________________________________________________________________    

## 2017-11-16 NOTE — Progress Notes (Signed)
Patient's Name: Joshua Humphrey  MRN: [id number]  Referring Provider: Kirk Ruths, MD  DOB: Apr 24, 1929  PCP: Kirk Ruths, MD  DOS: 11/16/2017  Note by: Gaspar Cola, MD  Service setting: Ambulatory outpatient  Specialty: Interventional Pain Management  Patient type: Established  Location: [facility] (AMB) Pain Management Facility  Visit type: Interventional Procedure   Primary Reason for Visit: Interventional Pain Management Treatment. CC: Knee Pain (bilateral)  Procedure:          Anesthesia, Analgesia, Anxiolysis:  Type: Therapeutic Intra-Articular Hyalgan Knee Injection #3  Region: Lateral infrapatellar Knee Region for the left knee and medial infrapatellar for the right knee Level: Knee Joint Laterality: Bilateral  Type: Local Anesthesia Indication(s): Analgesia         Local Anesthetic: Lidocaine 1-2% Route: Infiltration (/IM) IV Access: Declined Sedation: Declined    Indications: 1. Osteoarthritis of knee (Bilateral)   2. Chronic knee pain (Bilateral) (L>R)    Pain Score: Pre-procedure: 2 /10 Post-procedure: 0-No pain/10  Pre-op Assessment:  [name] is a 84 y.o. (year old), male patient, seen today for interventional treatment. He  has a past surgical history that includes tumor removed (2000); Prostate surgery (2002); lipoma removal (2000); Tonsillectomy; Cataract extraction, bilateral; Toe Surgery; Olecranon bursectomy (Left, 01/21/2016); Elbow surgery; and Eye surgery. Mr. Capri has a current medication list which includes the following prescription(s): acetaminophen, aspirin ec, atorvastatin, b-d ultrafine iii short pen, benzonatate, calcium carbonate-vitamin d, carvedilol, cetirizine, ferrous fumarate, flaxseed (linseed), fluticasone, furosemide, gabapentin, glucosamine-chondroitin, hydralazine, ipratropium-albuterol, levemir flextouch, losartan, magnesium oxide, metformin, mometasone-formoterol, montelukast, ocuvite adult 50+, nifedipine, omeprazole,  potassium chloride sa, pramipexole, sitagliptin, torsemide, and tramadol. His primarily concern today is the Knee Pain (bilateral)  Initial Vital Signs:  Pulse/HCG Rate: 73  Temp: 98.6 F (37 C) Resp: 18 BP: 133/87 SpO2: 95 %  BMI: Estimated body mass index is 32.19 kg/m as calculated from the following:   Height as of this encounter: 5\' 9"  (1.753 m).   Weight as of this encounter: 218 lb (98.9 kg).  Risk Assessment: Allergies: Reviewed. He has No Known Allergies.  Allergy Precautions: None required Coagulopathies: Reviewed. None identified.  Blood-thinner therapy: None at this time Active Infection(s): Reviewed. None identified. Mr. Duran is afebrile  Site Confirmation: Mr. Schildt was asked to confirm the procedure and laterality before marking the site Procedure checklist: Completed Consent: Before the procedure and under the influence of no sedative(s), amnesic(s), or anxiolytics, the patient was informed of the treatment options, risks and possible complications. To fulfill our ethical and legal obligations, as recommended by the American Medical Association's Code of Ethics, I have informed the patient of my clinical impression; the nature and purpose of the treatment or procedure; the risks, benefits, and possible complications of the intervention; the alternatives, including doing nothing; the risk(s) and benefit(s) of the alternative treatment(s) or procedure(s); and the risk(s) and benefit(s) of doing nothing. The patient was provided information about the general risks and possible complications associated with the procedure. These may include, but are not limited to: failure to achieve desired goals, infection, bleeding, organ or nerve damage, allergic reactions, paralysis, and death. In addition, the patient was informed of those risks and complications associated to the procedure, such as failure to decrease pain; infection; bleeding; organ or nerve damage with subsequent damage  to sensory, motor, and/or autonomic systems, resulting in permanent pain, numbness, and/or weakness of one or several areas of the body; allergic reactions; (i.e.: anaphylactic reaction); and/or death. Furthermore, the patient was  informed of those risks and complications associated with the medications. These include, but are not limited to: allergic reactions (i.e.: anaphylactic or anaphylactoid reaction(s)); adrenal axis suppression; blood sugar elevation that in diabetics may result in ketoacidosis or comma; water retention that in patients with history of congestive heart failure may result in shortness of breath, pulmonary edema, and decompensation with resultant heart failure; weight gain; swelling or edema; medication-induced neural toxicity; particulate matter embolism and blood vessel occlusion with resultant organ, and/or nervous system infarction; and/or aseptic necrosis of one or more joints. Finally, the patient was informed that Medicine is not an exact science; therefore, there is also the possibility of unforeseen or unpredictable risks and/or possible complications that may result in a catastrophic outcome. The patient indicated having understood very clearly. We have given the patient no guarantees and we have made no promises. Enough time was given to the patient to ask questions, all of which were answered to the patient's satisfaction. Mr. Schroth has indicated that he wanted to continue with the procedure. Attestation: I, the ordering provider, attest that I have discussed with the patient the benefits, risks, side-effects, alternatives, likelihood of achieving goals, and potential problems during recovery for the procedure that I have provided informed consent. Date  Time: 11/16/2017  1:57 PM  Pre-Procedure Preparation:  Monitoring: As per clinic protocol. Respiration, ETCO2, SpO2, BP, heart rate and rhythm monitor placed and checked for adequate function Safety Precautions: Patient was  assessed for positional comfort and pressure points before starting the procedure. Time-out: I initiated and conducted the "Time-out" before starting the procedure, as per protocol. The patient was asked to participate by confirming the accuracy of the "Time Out" information. Verification of the correct person, site, and procedure were performed and confirmed by me, the nursing staff, and the patient. "Time-out" conducted as per Joint Commission's Universal Protocol (UP.01.01.01). Time: 1420  Description of Procedure:          Position: Sitting Target Area: Knee Joint Approach: Just above the Lateral tibial plateau, lateral to the infrapatellar tendon. Area Prepped: Entire knee area, from the mid-thigh to the mid-shin. Prepping solution: ChloraPrep (2% chlorhexidine gluconate and 70% isopropyl alcohol) Safety Precautions: Aspiration looking for blood return was conducted prior to all injections. At no point did we inject any substances, as a needle was being advanced. No attempts were made at seeking any paresthesias. Safe injection practices and needle disposal techniques used. Medications properly checked for expiration dates. SDV (single dose vial) medications used. Description of the Procedure: Protocol guidelines were followed. The patient was placed in position over the fluoroscopy table. The target area was identified and the area prepped in the usual manner. Skin & deeper tissues infiltrated with local anesthetic. Appropriate amount of time allowed to pass for local anesthetics to take effect. The procedure needles were then advanced to the target area. Proper needle placement secured. Negative aspiration confirmed. Solution injected in intermittent fashion, asking for systemic symptoms every 0.5cc of injectate. The needles were then removed and the area cleansed, making sure to leave some of the prepping solution back to take advantage of its long term bactericidal properties. Vitals:    11/16/17 1355 11/16/17 1426  BP: 133/87 (!) 149/88  Pulse: 73 80  Resp: 18 16  Temp: 98.6 F (37 C)   TempSrc: Oral   SpO2: 95% 95%  Weight: 218 lb (98.9 kg)   Height: 5\' 9"  (1.753 m)     Start Time: 1420 hrs. End Time: 1424 hrs. Materials:  Needle(s) Type: Spinal needle Gauge: 22G Length: 3.5-in Medication(s): Please see orders for medications and dosing details.  Imaging Guidance:          Type of Imaging Technique: None used Indication(s): N/A Exposure Time: No patient exposure Contrast: None used. Fluoroscopic Guidance: N/A Ultrasound Guidance: N/A Interpretation: N/A  Antibiotic Prophylaxis:   Anti-infectives (From admission, onward)   None     Indication(s): None identified  Post-operative Assessment:  Post-procedure Vital Signs:  Pulse/HCG Rate: 80  Temp: 98.6 F (37 C) Resp: 16 BP: (!) 149/88 SpO2: 95 %  EBL: None  Complications: No immediate post-treatment complications observed by team, or reported by patient.  Note: The patient tolerated the entire procedure well. A repeat set of vitals were taken after the procedure and the patient was kept under observation following institutional policy, for this type of procedure. Post-procedural neurological assessment was performed, showing return to baseline, prior to discharge. The patient was provided with post-procedure discharge instructions, including a section on how to identify potential problems. Should any problems arise concerning this procedure, the patient was given instructions to immediately contact us, at any time, without hesitation. In any case, we plan to contact the patient by telephone for a follow-up status report regarding this interventional procedure.  Comments:  No additional relevant information.  Plan of Care   Imaging Orders  No imaging studies ordered today    Procedure Orders     KNEE INJECTION     KNEE INJECTION  Medications ordered for procedure: Meds ordered this  encounter  Medications  . lidocaine (PF) (XYLOCAINE) 1 % injection 4 mL  . ropivacaine (PF) 2 mg/mL (0.2%) (NAROPIN) injection 4 mL  . Sodium Hyaluronate SOSY 2 mL  . Sodium Hyaluronate SOSY 2 mL   Medications administered: We administered lidocaine (PF), ropivacaine (PF) 2 mg/mL (0.2%), Sodium Hyaluronate, and Sodium Hyaluronate.  See the medical record for exact dosing, route, and time of administration.  New Prescriptions   No medications on file   Disposition: Discharge home  Discharge Date & Time: 11/16/2017; 1430 hrs.   Physician-requested Follow-up: Return for PPE (2 wks) + Procedure (no sedation): (B) Hyalgan #4.  Future Appointments  Date Time Provider [place]  11/30/2017  1:45 PM Milinda Pointer, MD [facility] None  12/23/2017 11:00 AM Vevelyn Francois, NP [facility] None  05/02/2018  2:15 PM Stoioff, Ronda Fairly, MD [facility] None   Primary Care Physician: Kirk Ruths, MD Location: [facility] Note by: Gaspar Cola, MD Date: 11/16/2017; Time: 2:30 PM  Disclaimer:  Medicine is not an Chief Strategy Officer. The only guarantee in medicine is that nothing is guaranteed. It is important to note that the decision to proceed with this intervention was based on the information collected from the patient. The Data and conclusions were drawn from the patient's questionnaire, the interview, and the physical examination. Because the information was provided in large part by the patient, it cannot be guaranteed that it has not been purposely or unconsciously manipulated. Every effort has been made to obtain as much relevant data as possible for this evaluation. It is important to note that the conclusions that lead to this procedure are derived in large part from the available data. Always take into account that the treatment will also be dependent on availability of resources and existing treatment guidelines, considered by other Pain Management  Practitioners as being common knowledge and practice, at the time of the intervention. For Medico-Legal purposes, it is also  important to point out that variation in procedural techniques and pharmacological choices are the acceptable norm. The indications, contraindications, technique, and results of the above procedure should only be interpreted and judged by a Board-Certified Interventional Pain Specialist with extensive familiarity and expertise in the same exact procedure and technique.

## 2017-11-16 NOTE — Progress Notes (Signed)
Safety precautions to be maintained throughout the outpatient stay will include: orient to surroundings, keep bed in low position, maintain call bell within reach at all times, provide assistance with transfer out of bed and ambulation.  

## 2017-11-17 ENCOUNTER — Telehealth: Payer: Self-pay

## 2017-11-17 NOTE — Telephone Encounter (Signed)
No answer. Left message on AM to call if needed 

## 2017-11-30 ENCOUNTER — Ambulatory Visit: Payer: Medicare Other | Admitting: Pain Medicine

## 2017-11-30 NOTE — Progress Notes (Deleted)
Patient's Name: Joshua Humphrey  MRN: [id number]  Referring Provider: Kirk Ruths, MD  DOB: 06-26-1929  PCP: Kirk Ruths, MD  DOS: 11/30/2017  Note by: Gaspar Cola, MD  Service setting: Ambulatory outpatient  Specialty: Interventional Pain Management  Patient type: Established  Location: [facility] (AMB) Pain Management Facility  Visit type: Interventional Procedure   Primary Reason for Visit: Interventional Pain Management Treatment. CC: No chief complaint on file.  Procedure:          Anesthesia, Analgesia, Anxiolysis:  Type: Therapeutic Intra-Articular Hyalgan Knee Injection #4  Region: Lateral infrapatellar Knee Region Level: Knee Joint Laterality: Bilateral  Type: Local Anesthesia Indication(s): Analgesia         Local Anesthetic: Lidocaine 1-2% Route: Infiltration ([place]/IM) IV Access: Declined Sedation: Declined    Indications: 1. Osteoarthritis of knee (Bilateral)   2. Chronic knee pain (Bilateral) (L>R)    Pain Score: Pre-procedure:  /10 Post-procedure:  /10  Pre-op Assessment:  Joshua Humphrey is a 84 y.o. (year old), male patient, seen today for interventional treatment. He  has a past surgical history that includes tumor removed (2000); Prostate surgery (2002); lipoma removal (2000); Tonsillectomy; Cataract extraction, bilateral; Toe Surgery; Olecranon bursectomy (Left, 01/21/2016); Elbow surgery; and Eye surgery. Joshua Humphrey has a current medication list which includes the following prescription(s): acetaminophen, aspirin ec, atorvastatin, b-d ultrafine iii short pen, benzonatate, calcium carbonate-vitamin d, carvedilol, cetirizine, ferrous fumarate, flaxseed (linseed), fluticasone, furosemide, gabapentin, glucosamine-chondroitin, hydralazine, ipratropium-albuterol, levemir flextouch, losartan, magnesium oxide, metformin, mometasone-formoterol, montelukast, ocuvite adult 50+, nifedipine, omeprazole, potassium chloride sa, pramipexole, sitagliptin, torsemide, and  tramadol. His primarily concern today is the No chief complaint on file.  Initial Vital Signs:  Pulse/HCG Rate:    Temp:   Resp:   BP:   SpO2:    BMI: Estimated body mass index is 32.19 kg/m as calculated from the following:   Height as of 11/16/17: 5\' 9"  (1.753 m).   Weight as of 11/16/17: 218 lb (98.9 kg).  Risk Assessment: Allergies: Reviewed. He has No Known Allergies.  Allergy Precautions: None required Coagulopathies: Reviewed. None identified.  Blood-thinner therapy: None at this time Active Infection(s): Reviewed. None identified. Joshua Humphrey is afebrile  Site Confirmation: Joshua Humphrey was asked to confirm the procedure and laterality before marking the site Procedure checklist: Completed Consent: Before the procedure and under the influence of no sedative(s), amnesic(s), or anxiolytics, the patient was informed of the treatment options, risks and possible complications. To fulfill our ethical and legal obligations, as recommended by the American Medical Association's Code of Ethics, I have informed the patient of my clinical impression; the nature and purpose of the treatment or procedure; the risks, benefits, and possible complications of the intervention; the alternatives, including doing nothing; the risk(s) and benefit(s) of the alternative treatment(s) or procedure(s); and the risk(s) and benefit(s) of doing nothing. The patient was provided information about the general risks and possible complications associated with the procedure. These may include, but are not limited to: failure to achieve desired goals, infection, bleeding, organ or nerve damage, allergic reactions, paralysis, and death. In addition, the patient was informed of those risks and complications associated to the procedure, such as failure to decrease pain; infection; bleeding; organ or nerve damage with subsequent damage to sensory, motor, and/or autonomic systems, resulting in permanent pain, numbness, and/or  weakness of one or several areas of the body; allergic reactions; (i.e.: anaphylactic reaction); and/or death. Furthermore, the patient was informed of those risks and complications associated with  the medications. These include, but are not limited to: allergic reactions (i.e.: anaphylactic or anaphylactoid reaction(s)); adrenal axis suppression; blood sugar elevation that in diabetics may result in ketoacidosis or comma; water retention that in patients with history of congestive heart failure may result in shortness of breath, pulmonary edema, and decompensation with resultant heart failure; weight gain; swelling or edema; medication-induced neural toxicity; particulate matter embolism and blood vessel occlusion with resultant organ, and/or nervous system infarction; and/or aseptic necrosis of one or more joints. Finally, the patient was informed that Medicine is not an exact science; therefore, there is also the possibility of unforeseen or unpredictable risks and/or possible complications that may result in a catastrophic outcome. The patient indicated having understood very clearly. We have given the patient no guarantees and we have made no promises. Enough time was given to the patient to ask questions, all of which were answered to the patient's satisfaction. Joshua Humphrey has indicated that he wanted to continue with the procedure. Attestation: I, the ordering provider, attest that I have discussed with the patient the benefits, risks, side-effects, alternatives, likelihood of achieving goals, and potential problems during recovery for the procedure that I have provided informed consent. Date  Time: {CHL [facility] TIME CHOICES:21018001}  Pre-Procedure Preparation:  Monitoring: As per clinic protocol. Respiration, ETCO2, SpO2, BP, heart rate and rhythm monitor placed and checked for adequate function Safety Precautions: Patient was assessed for positional comfort and pressure points before starting the  procedure. Time-out: I initiated and conducted the "Time-out" before starting the procedure, as per protocol. The patient was asked to participate by confirming the accuracy of the "Time Out" information. Verification of the correct person, site, and procedure were performed and confirmed by me, the nursing staff, and the patient. "Time-out" conducted as per Joint Commission's Universal Protocol (UP.01.01.01). Time:    Description of Procedure:          Position: Sitting Target Area: Knee Joint Approach: Just above the Lateral tibial plateau, lateral to the infrapatellar tendon. Area Prepped: Entire knee area, from the mid-thigh to the mid-shin. Prepping solution: ChloraPrep (2% chlorhexidine gluconate and 70% isopropyl alcohol) Safety Precautions: Aspiration looking for blood return was conducted prior to all injections. At no point did we inject any substances, as a needle was being advanced. No attempts were made at seeking any paresthesias. Safe injection practices and needle disposal techniques used. Medications properly checked for expiration dates. SDV (single dose vial) medications used. Description of the Procedure: Protocol guidelines were followed. The patient was placed in position over the fluoroscopy table. The target area was identified and the area prepped in the usual manner. Skin & deeper tissues infiltrated with local anesthetic. Appropriate amount of time allowed to pass for local anesthetics to take effect. The procedure needles were then advanced to the target area. Proper needle placement secured. Negative aspiration confirmed. Solution injected in intermittent fashion, asking for systemic symptoms every 0.5cc of injectate. The needles were then removed and the area cleansed, making sure to leave some of the prepping solution back to take advantage of its long term bactericidal properties. There were no vitals filed for this visit.  Start Time:   hrs. End Time:   hrs. Materials:   Needle(s) Type: Regular needle Gauge: 25G Length: 1.5-in Medication(s): Please see orders for medications and dosing details. Imaging Guidance:          Type of Imaging Technique: None used Indication(s): N/A Exposure Time: No patient exposure Contrast: None used. Fluoroscopic Guidance: N/A  Ultrasound Guidance: N/A Interpretation: N/A  Antibiotic Prophylaxis:   Anti-infectives (From admission, onward)   None     Indication(s): None identified  Post-operative Assessment:  Post-procedure Vital Signs:  Pulse/HCG Rate:    Temp:   Resp:   BP:   SpO2:    EBL: None  Complications: No immediate post-treatment complications observed by team, or reported by patient.  Note: The patient tolerated the entire procedure well. A repeat set of vitals were taken after the procedure and the patient was kept under observation following institutional policy, for this type of procedure. Post-procedural neurological assessment was performed, showing return to baseline, prior to discharge. The patient was provided with post-procedure discharge instructions, including a section on how to identify potential problems. Should any problems arise concerning this procedure, the patient was given instructions to immediately contact us, at any time, without hesitation. In any case, we plan to contact the patient by telephone for a follow-up status report regarding this interventional procedure.  Comments:  No additional relevant information.  Plan of Care   Imaging Orders  No imaging studies ordered today   Procedure Orders    No procedure(s) ordered today    Medications ordered for procedure: No orders of the defined types were placed in this encounter.  Medications administered: Arman Loy. Vick had no medications administered during this visit.  See the medical record for exact dosing, route, and time of administration.  New Prescriptions   No medications on file   Disposition: Discharge  home  Discharge Date & Time: 11/30/2017;   hrs.   Physician-requested Follow-up: No follow-ups on file.  Future Appointments  Date Time Provider [place]  11/30/2017  1:45 PM Milinda Pointer, MD [facility] None  12/23/2017 11:00 AM Vevelyn Francois, NP [facility] None  05/02/2018  2:15 PM Stoioff, Ronda Fairly, MD [facility] None   Primary Care Physician: Kirk Ruths, MD Location: [facility] Note by: Gaspar Cola, MD Date: 11/30/2017; Time: 6:11 AM  Disclaimer:  Medicine is not an Chief Strategy Officer. The only guarantee in medicine is that nothing is guaranteed. It is important to note that the decision to proceed with this intervention was based on the information collected from the patient. The Data and conclusions were drawn from the patient's questionnaire, the interview, and the physical examination. Because the information was provided in large part by the patient, it cannot be guaranteed that it has not been purposely or unconsciously manipulated. Every effort has been made to obtain as much relevant data as possible for this evaluation. It is important to note that the conclusions that lead to this procedure are derived in large part from the available data. Always take into account that the treatment will also be dependent on availability of resources and existing treatment guidelines, considered by other Pain Management Practitioners as being common knowledge and practice, at the time of the intervention. For Medico-Legal purposes, it is also important to point out that variation in procedural techniques and pharmacological choices are the acceptable norm. The indications, contraindications, technique, and results of the above procedure should only be interpreted and judged by a Board-Certified Interventional Pain Specialist with extensive familiarity and expertise in the same exact procedure and technique.

## 2017-12-14 ENCOUNTER — Ambulatory Visit: Payer: Medicare Other | Admitting: Pain Medicine

## 2017-12-23 ENCOUNTER — Encounter: Payer: Medicare Other | Admitting: Nurse Practitioner

## 2017-12-28 ENCOUNTER — Ambulatory Visit: Payer: Medicare Other | Admitting: Pain Medicine

## 2017-12-28 NOTE — Progress Notes (Deleted)
Patient's Name: Joshua Humphrey  MRN: [id number]  Referring Provider: Kirk Ruths, MD  DOB: 08-23-1929  PCP: Kirk Ruths, MD  DOS: 12/28/2017  Note by: Gaspar Cola, MD  Service setting: Ambulatory outpatient  Specialty: Interventional Pain Management  Patient type: Established  Location: [facility] (AMB) Pain Management Facility  Visit type: Interventional Procedure   Primary Reason for Visit: Interventional Pain Management Treatment. CC: No chief complaint on file.  Procedure:          Anesthesia, Analgesia, Anxiolysis:  Type: Therapeutic Intra-Articular Hyalgan Knee Injection #4  Region: Lateral infrapatellar Knee Region Level: Knee Joint Laterality: Bilateral  Type: Local Anesthesia Indication(s): Analgesia         Local Anesthetic: Lidocaine 1-2% Route: Infiltration ([place]/IM) IV Access: Declined Sedation: Declined   Position: Sitting   Indications: 1. Osteoarthritis of knee (Bilateral)   2. Chronic knee pain (Bilateral) (L>R)    Pain Score: Pre-procedure:  /10 Post-procedure:  /10  Pre-op Assessment:  Joshua Humphrey is a 84 y.o. (year old), male patient, seen today for interventional treatment. He  has a past surgical history that includes tumor removed (2000); Prostate surgery (2002); lipoma removal (2000); Tonsillectomy; Cataract extraction, bilateral; Toe Surgery; Olecranon bursectomy (Left, 01/21/2016); Elbow surgery; and Eye surgery. Joshua Humphrey has a current medication list which includes the following prescription(s): acetaminophen, aspirin ec, atorvastatin, b-d ultrafine iii short pen, benzonatate, calcium carbonate-vitamin d, carvedilol, cetirizine, ferrous fumarate, flaxseed (linseed), fluticasone, furosemide, gabapentin, glucosamine-chondroitin, hydralazine, ipratropium-albuterol, levemir flextouch, losartan, magnesium oxide, metformin, mometasone-formoterol, montelukast, ocuvite adult 50+, nifedipine, omeprazole, potassium chloride sa, pramipexole, sitagliptin,  torsemide, and tramadol. His primarily concern today is the No chief complaint on file.  Initial Vital Signs:  Pulse/HCG Rate:    Temp:   Resp:   BP:   SpO2:    BMI: Estimated body mass index is 32.19 kg/m as calculated from the following:   Height as of 11/16/17: 5\' 9"  (1.753 m).   Weight as of 11/16/17: 218 lb (98.9 kg).  Risk Assessment: Allergies: Reviewed. He has No Known Allergies.  Allergy Precautions: None required Coagulopathies: Reviewed. None identified.  Blood-thinner therapy: None at this time Active Infection(s): Reviewed. None identified. Joshua Humphrey is afebrile  Site Confirmation: Joshua Humphrey was asked to confirm the procedure and laterality before marking the site Procedure checklist: Completed Consent: Before the procedure and under the influence of no sedative(s), amnesic(s), or anxiolytics, the patient was informed of the treatment options, risks and possible complications. To fulfill our ethical and legal obligations, as recommended by the American Medical Association's Code of Ethics, I have informed the patient of my clinical impression; the nature and purpose of the treatment or procedure; the risks, benefits, and possible complications of the intervention; the alternatives, including doing nothing; the risk(s) and benefit(s) of the alternative treatment(s) or procedure(s); and the risk(s) and benefit(s) of doing nothing. The patient was provided information about the general risks and possible complications associated with the procedure. These may include, but are not limited to: failure to achieve desired goals, infection, bleeding, organ or nerve damage, allergic reactions, paralysis, and death. In addition, the patient was informed of those risks and complications associated to the procedure, such as failure to decrease pain; infection; bleeding; organ or nerve damage with subsequent damage to sensory, motor, and/or autonomic systems, resulting in permanent pain,  numbness, and/or weakness of one or several areas of the body; allergic reactions; (i.e.: anaphylactic reaction); and/or death. Furthermore, the patient was informed of those risks and  complications associated with the medications. These include, but are not limited to: allergic reactions (i.e.: anaphylactic or anaphylactoid reaction(s)); adrenal axis suppression; blood sugar elevation that in diabetics may result in ketoacidosis or comma; water retention that in patients with history of congestive heart failure may result in shortness of breath, pulmonary edema, and decompensation with resultant heart failure; weight gain; swelling or edema; medication-induced neural toxicity; particulate matter embolism and blood vessel occlusion with resultant organ, and/or nervous system infarction; and/or aseptic necrosis of one or more joints. Finally, the patient was informed that Medicine is not an exact science; therefore, there is also the possibility of unforeseen or unpredictable risks and/or possible complications that may result in a catastrophic outcome. The patient indicated having understood very clearly. We have given the patient no guarantees and we have made no promises. Enough time was given to the patient to ask questions, all of which were answered to the patient's satisfaction. Joshua Humphrey has indicated that he wanted to continue with the procedure. Attestation: I, the ordering provider, attest that I have discussed with the patient the benefits, risks, side-effects, alternatives, likelihood of achieving goals, and potential problems during recovery for the procedure that I have provided informed consent. Date  Time: {CHL [facility] TIME CHOICES:21018001}  Pre-Procedure Preparation:  Monitoring: As per clinic protocol. Respiration, ETCO2, SpO2, BP, heart rate and rhythm monitor placed and checked for adequate function Safety Precautions: Patient was assessed for positional comfort and pressure points  before starting the procedure. Time-out: I initiated and conducted the "Time-out" before starting the procedure, as per protocol. The patient was asked to participate by confirming the accuracy of the "Time Out" information. Verification of the correct person, site, and procedure were performed and confirmed by me, the nursing staff, and the patient. "Time-out" conducted as per Joint Commission's Universal Protocol (UP.01.01.01). Time:    Description of Procedure:          Target Area: Knee Joint Approach: Just above the Lateral tibial plateau, lateral to the infrapatellar tendon. Area Prepped: Entire knee area, from the mid-thigh to the mid-shin. Prepping solution: ChloraPrep (2% chlorhexidine gluconate and 70% isopropyl alcohol) Safety Precautions: Aspiration looking for blood return was conducted prior to all injections. At no point did we inject any substances, as a needle was being advanced. No attempts were made at seeking any paresthesias. Safe injection practices and needle disposal techniques used. Medications properly checked for expiration dates. SDV (single dose vial) medications used. Description of the Procedure: Protocol guidelines were followed. The patient was placed in position over the fluoroscopy table. The target area was identified and the area prepped in the usual manner. Skin & deeper tissues infiltrated with local anesthetic. Appropriate amount of time allowed to pass for local anesthetics to take effect. The procedure needles were then advanced to the target area. Proper needle placement secured. Negative aspiration confirmed. Solution injected in intermittent fashion, asking for systemic symptoms every 0.5cc of injectate. The needles were then removed and the area cleansed, making sure to leave some of the prepping solution back to take advantage of its long term bactericidal properties. There were no vitals filed for this visit.  Start Time:   hrs. End Time:    hrs. Materials:  Needle(s) Type: Regular needle Gauge: 25G Length: 1.5-in Medication(s): Please see orders for medications and dosing details.  Imaging Guidance:          Type of Imaging Technique: None used Indication(s): N/A Exposure Time: No patient exposure Contrast: None used. Fluoroscopic  Guidance: N/A Ultrasound Guidance: N/A Interpretation: N/A  Antibiotic Prophylaxis:   Anti-infectives (From admission, onward)   None     Indication(s): None identified  Post-operative Assessment:  Post-procedure Vital Signs:  Pulse/HCG Rate:    Temp:   Resp:   BP:   SpO2:    EBL: None  Complications: No immediate post-treatment complications observed by team, or reported by patient.  Note: The patient tolerated the entire procedure well. A repeat set of vitals were taken after the procedure and the patient was kept under observation following institutional policy, for this type of procedure. Post-procedural neurological assessment was performed, showing return to baseline, prior to discharge. The patient was provided with post-procedure discharge instructions, including a section on how to identify potential problems. Should any problems arise concerning this procedure, the patient was given instructions to immediately contact us, at any time, without hesitation. In any case, we plan to contact the patient by telephone for a follow-up status report regarding this interventional procedure.  Comments:  No additional relevant information.  Plan of Care   Imaging Orders  No imaging studies ordered today   Procedure Orders    No procedure(s) ordered today    Medications ordered for procedure: No orders of the defined types were placed in this encounter.  Medications administered: Joshua Humphrey had no medications administered during this visit.  See the medical record for exact dosing, route, and time of administration.  Disposition: Discharge home  Discharge Date & Time:  12/28/2017;   hrs.   Physician-requested Follow-up: No follow-ups on file.  Future Appointments  Date Time Provider [place]  12/28/2017  2:00 PM Milinda Pointer, MD [facility] None  01/31/2018 11:15 AM Vevelyn Francois, NP [facility] None  05/02/2018  2:15 PM Stoioff, Ronda Fairly, MD [facility] None   Primary Care Physician: Kirk Ruths, MD Location: [facility] Note by: Gaspar Cola, MD Date: 12/28/2017; Time: 7:18 AM  Disclaimer:  Medicine is not an Chief Strategy Officer. The only guarantee in medicine is that nothing is guaranteed. It is important to note that the decision to proceed with this intervention was based on the information collected from the patient. The Data and conclusions were drawn from the patient's questionnaire, the interview, and the physical examination. Because the information was provided in large part by the patient, it cannot be guaranteed that it has not been purposely or unconsciously manipulated. Every effort has been made to obtain as much relevant data as possible for this evaluation. It is important to note that the conclusions that lead to this procedure are derived in large part from the available data. Always take into account that the treatment will also be dependent on availability of resources and existing treatment guidelines, considered by other Pain Management Practitioners as being common knowledge and practice, at the time of the intervention. For Medico-Legal purposes, it is also important to point out that variation in procedural techniques and pharmacological choices are the acceptable norm. The indications, contraindications, technique, and results of the above procedure should only be interpreted and judged by a Board-Certified Interventional Pain Specialist with extensive familiarity and expertise in the same exact procedure and technique.

## 2018-01-14 ENCOUNTER — Other Ambulatory Visit: Payer: Self-pay | Admitting: Pain Medicine

## 2018-01-14 DIAGNOSIS — G894 Chronic pain syndrome: Secondary | ICD-10-CM

## 2018-01-19 ENCOUNTER — Other Ambulatory Visit: Payer: Self-pay

## 2018-01-19 ENCOUNTER — Emergency Department: Payer: Medicare Other

## 2018-01-19 ENCOUNTER — Emergency Department
Admission: EM | Admit: 2018-01-19 | Discharge: 2018-01-19 | Disposition: A | Discharge status: Home / Self Care | Payer: Medicare Other | Attending: Emergency Medicine | Admitting: Emergency Medicine

## 2018-01-19 DIAGNOSIS — Z7982 Long term (current) use of aspirin: Secondary | ICD-10-CM | POA: Diagnosis not present

## 2018-01-19 DIAGNOSIS — W19XXXA Unspecified fall, initial encounter: Secondary | ICD-10-CM

## 2018-01-19 DIAGNOSIS — Z794 Long term (current) use of insulin: Secondary | ICD-10-CM | POA: Diagnosis not present

## 2018-01-19 DIAGNOSIS — W01198A Fall on same level from slipping, tripping and stumbling with subsequent striking against other object, initial encounter: Secondary | ICD-10-CM | POA: Diagnosis not present

## 2018-01-19 DIAGNOSIS — E1122 Type 2 diabetes mellitus with diabetic chronic kidney disease: Secondary | ICD-10-CM | POA: Insufficient documentation

## 2018-01-19 DIAGNOSIS — Z79899 Other long term (current) drug therapy: Secondary | ICD-10-CM | POA: Diagnosis not present

## 2018-01-19 DIAGNOSIS — N181 Chronic kidney disease, stage 1: Secondary | ICD-10-CM | POA: Diagnosis not present

## 2018-01-19 DIAGNOSIS — S0990XA Unspecified injury of head, initial encounter: Secondary | ICD-10-CM | POA: Diagnosis not present

## 2018-01-19 DIAGNOSIS — Z23 Encounter for immunization: Secondary | ICD-10-CM | POA: Insufficient documentation

## 2018-01-19 DIAGNOSIS — Y9389 Activity, other specified: Secondary | ICD-10-CM | POA: Insufficient documentation

## 2018-01-19 DIAGNOSIS — I130 Hypertensive heart and chronic kidney disease with heart failure and stage 1 through stage 4 chronic kidney disease, or unspecified chronic kidney disease: Secondary | ICD-10-CM | POA: Diagnosis not present

## 2018-01-19 DIAGNOSIS — Y999 Unspecified external cause status: Secondary | ICD-10-CM | POA: Diagnosis not present

## 2018-01-19 DIAGNOSIS — I5032 Chronic diastolic (congestive) heart failure: Secondary | ICD-10-CM | POA: Insufficient documentation

## 2018-01-19 DIAGNOSIS — Y92198 Other place in other specified residential institution as the place of occurrence of the external cause: Secondary | ICD-10-CM | POA: Diagnosis not present

## 2018-01-19 MED ORDER — TETANUS-DIPHTH-ACELL PERTUSSIS 5-2.5-18.5 LF-MCG/0.5 IM SUSP
0.5000 mL | Freq: Once | INTRAMUSCULAR | Status: AC
  Administered 2018-01-19: 0.5 mL via INTRAMUSCULAR
  Filled 2018-01-19: qty 0.5

## 2018-01-19 NOTE — ED Provider Notes (Signed)
[facility] Emergency Department Provider Note  ____________________________________________   First MD Initiated Contact with Patient 01/19/18 1404     (approximate)  I have reviewed the triage vital signs and the nursing notes.   HISTORY  Chief Complaint Fall and Head Injury    HPI Joshua Humphrey is a 84 y.o. male presents emergency department from [facility] after falling when his feet got caught up in his walker.  He hit his forehead.  He had a laceration which they applied Steri-Strips.  He is not on any blood thinners.  He is denying headache, neck pain, chest pain, shortness of breath, back or hip pain.  He states he has been ambulatory since the fall.  He is unsure of his last tetanus.    Past Medical History:  Diagnosis Date  . Anemia   . Asthma   . Cancer [facility])    prostate  . Cancer ([place])    skin   . Diabetes mellitus without complication ([place])   . GERD (gastroesophageal reflux disease)   . Hypertension   . PONV (postoperative nausea and vomiting)   . Respiratory infection 08/23/2017  . Septic olecranon bursitis of left elbow 01/22/2016  . Sleep apnea     Patient Active Problem List   Diagnosis Date Noted  . Aortic stenosis 10/27/2017  . Chronic diastolic CHF (congestive heart failure) ([place]) 10/27/2017  . Uses walker 10/27/2017  . Edema extremities 10/12/2017  . DDD (degenerative disc disease), lumbar 09/02/2017  . Weakness of lower extremities (Bilateral) 09/02/2017  . Spondylosis without myelopathy or radiculopathy, lumbosacral region 07/15/2017  . Lumbar spondylosis 06/30/2017  . Ataxia 04/28/2017  . Lumbar facet joint osteoarthritis (Bilateral) 04/08/2017  . Osteoarthritis of knee (Bilateral) 12/22/2016  . Abnormal knee x-ray (2017) 12/22/2016  . Lumbar facet syndrome (Bilateral) 11/04/2016  . Osteoarthritis of lumbar spine 11/04/2016  . Cervical spinal stenosis 11/03/2016  . Cervical foraminal stenosis 11/03/2016  .  Osteoarthritis of hip (Left) 10/15/2016  . Chronic hip arthralgia (Left) 10/15/2016  . Vitamin D insufficiency 10/15/2016  . Osteoarthritis 10/08/2016  . Lumbar foraminal stenosis (Right: L4-5 and L5-S1) (Left: L2-3, L3-4, and L4-5) 10/08/2016  . Neurogenic pain 10/05/2016  . Disturbance of skin sensation 09/08/2016  . Chronic hip pain (Secondary Area of Pain) (Left) 09/08/2016  . Chronic knee pain (Bilateral) (L>R) 09/08/2016  . Generalized weakness 09/08/2016  . Muscle weakness (generalized) 09/08/2016  . Chronic Lumbar radicular pain (Left) (L5) 09/08/2016  . Cortical senile cataract 09/06/2016  . Chronic pain syndrome 09/06/2016  . Long term (current) use of opiate analgesic 09/06/2016  . Long term prescription opiate use 09/06/2016  . Opiate use (30 MME/Day) 09/06/2016  . Chronic low back pain (Primary Area of Pain) (Bilateral) (R>L) 09/06/2016  . Chronic lower extremity pain [facility] source of pain) (Bilateral) (L>R) 09/06/2016  . Abnormal MRI, lumbar spine 09/06/2016  . Abnormal MRI, cervical spine 09/06/2016  . Lumbar spinal stenosis with neurogenic claudication 09/06/2016  . Health care maintenance 03/31/2016  . Mass of subcutaneous tissue of back 03/06/2016  . Scoliosis (and kyphoscoliosis), idiopathic 10/22/2015  . Lumbar central spinal stenosis (Severe:L4-5; Mild:L3-4; Moderate:L2-3) 10/22/2015  . Lipoma of neck 01/09/2014  . Post-operative state 12/31/2011  . Restless leg syndrome 12/30/2011  . Allergic rhinitis 11/25/2011  . Anemia 11/25/2011  . Asthma 11/25/2011  . GERD (gastroesophageal reflux disease) 11/25/2011  . Hyperlipidemia, unspecified 11/25/2011  . Hypertension 11/25/2011  . Obesity 11/25/2011  . Obstructive sleep apnea 11/25/2011  .  Prostate cancer ([place]) 11/25/2011  . Cataract 10/29/2011  . Type 2 diabetes mellitus with stage 1 chronic kidney disease ([place]) 10/29/2011    Past Surgical History:  Procedure Laterality Date  . CATARACT EXTRACTION,  BILATERAL    . ELBOW SURGERY    . EYE SURGERY    . lipoma removal  2000   back   . OLECRANON BURSECTOMY Left 01/21/2016   Procedure: OLECRANON BURSA;  Surgeon: Corky Mull, MD;  Location: [facility] ORS;  Service: Orthopedics;  Laterality: Left;  . PROSTATE SURGERY  2002  . TOE SURGERY    . TONSILLECTOMY    . tumor removed  2000    Prior to Admission medications   Medication Sig Start Date End Date Taking? Authorizing Provider  acetaminophen (TYLENOL) 650 MG CR tablet Take 650-1,300 mg by mouth every 8 (eight) hours as needed (for pain.).     [provider]  aspirin EC 81 MG tablet Take 81 mg by mouth daily.     [provider]  atorvastatin (LIPITOR) 40 MG tablet Take 40 mg by mouth daily.  11/06/15   [provider]  B-D ULTRAFINE III SHORT PEN 31G X 8 MM MISC USE BID UTD 12/23/16   [provider]  benzonatate (TESSALON) 200 MG capsule Take 200 mg by mouth as needed. 06/22/17   [provider]  Calcium Carbonate-Vitamin D (CALTRATE 600+D PO) Take 2 tablets by mouth daily.    [provider]  carvedilol (COREG) 25 MG tablet TAKE 1 TABLET (25 MG) TWICE DAILY 09/05/15   [provider]  cetirizine (ZYRTEC) 10 MG tablet Take 10 mg by mouth every evening.     [provider]  ferrous fumarate (HEMOCYTE - 106 MG FE) 325 (106 FE) MG TABS tablet Take 1 tablet by mouth 2 (two) times daily.     [provider]  FLAXSEED, LINSEED, PO Take by mouth daily.    [provider]  fluticasone (FLONASE) 50 MCG/ACT nasal spray Place 1-2 sprays into both nostrils daily as needed (for allergies.).  11/06/15   [provider]  furosemide (LASIX) 40 MG tablet TAKE 1 TABLET BY MOUTH TWICE DAILY 12/06/15   [provider]  gabapentin (NEURONTIN) 300 MG capsule Take 1-3 capsules (300-900 mg total) by mouth 4 (four) times daily. Follow titration schedule. 09/02/17 03/01/18  Milinda Pointer, MD    Glucosamine-Chondroit-Vit C-Mn (GLUCOSAMINE-CHONDROITIN) CAPS Take 2 capsules by mouth daily.    [provider]  hydrALAZINE (APRESOLINE) 50 MG tablet Take 50 mg by mouth 3 (three) times daily.  12/13/15   [provider]  Ipratropium-Albuterol (COMBIVENT RESPIMAT) 20-100 MCG/ACT AERS respimat INHALE 2 PUFF FOUR TIMES DAILY AS NEEDED FOR SHORTNESS OF BREATH AND/OR WHEEZING. 10/30/11   [provider]  LEVEMIR FLEXTOUCH 100 UNIT/ML Pen Inject 15 Units into the skin every evening.  01/07/16   [provider]  losartan (COZAAR) 100 MG tablet Take 100 mg by mouth daily.     [provider]  magnesium oxide (MAG-OX) 400 MG tablet Take 400 mg by mouth daily.    [provider]  metFORMIN (GLUCOPHAGE) 850 MG tablet Take 850 mg by mouth 2 (two) times daily.  12/23/15   [provider]  mometasone-formoterol (DULERA) 200-5 MCG/ACT AERO Inhale 2 puffs into the lungs 2 (two) times daily.  12/30/15   [provider]  montelukast (SINGULAIR) 10 MG tablet Take 10 mg by mouth at bedtime.    [provider]  Multiple Vitamins-Minerals (OCUVITE ADULT 50+) CAPS Take 1 capsule by mouth daily.     [provider]  NIFEdipine (PROCARDIA XL/ADALAT-CC) 90 MG 24 hr tablet Take 90 mg by mouth daily.  12/30/15   [provider]  omeprazole (PRILOSEC) 20 MG capsule Take 20 mg by mouth daily.    [provider]  potassium chloride SA (K-DUR,KLOR-CON) 20 MEQ tablet TAKE 1 TABLET(20 MEQ) BY MOUTH TWICE DAILY 11/30/16   [provider]  pramipexole (MIRAPEX) 0.25 MG tablet Take 0.25 mg by mouth at bedtime as needed (for restless leg syndrome).     [provider]  sitaGLIPtin (JANUVIA) 100 MG tablet Take 100 mg by mouth daily.    [provider]  torsemide (DEMADEX) 20 MG tablet Take by mouth. 10/12/17 10/12/18  [provider]  traMADol (ULTRAM) 50 MG tablet Take 1 tablet (50 mg total) by  mouth daily as needed for severe pain. 09/02/17 03/01/18  Milinda Pointer, MD    Allergies Patient has no known allergies.  Family History  Problem Relation Age of Onset  . Cancer Mother   . Alcohol abuse Father   . Cancer Brother     Social History Social History   Tobacco Use  . Smoking status: Never Smoker  . Smokeless tobacco: Never Used  Substance Use Topics  . Alcohol use: Yes    Alcohol/week: 0.0 standard drinks  . Drug use: No    Review of Systems  Constitutional: No fever/chills, positive head injury Eyes: No visual changes. ENT: No sore throat. Respiratory: Denies cough Genitourinary: Negative for dysuria. Musculoskeletal: Negative for back pain. Skin: Negative for rash.  Positive lacto forehead    ____________________________________________   PHYSICAL EXAM:  VITAL SIGNS: ED Triage Vitals  Enc Vitals Group     BP 01/19/18 1317 (!) 121/58     Pulse Rate 01/19/18 1317 73     Resp 01/19/18 1317 16     Temp 01/19/18 1317 98.2 F (36.8 C)     Temp Source 01/19/18 1317 Oral     SpO2 01/19/18 1317 95 %     Weight 01/19/18 1318 220 lb (99.8 kg)     Height 01/19/18 1318 5\' 9"  (1.753 m)     Head Circumference --      Peak Flow --      Pain Score 01/19/18 1318 0     Pain Loc --      Pain Edu? --      Excl. in [place]? --     Constitutional: Alert and oriented. Well appearing and in no acute distress. Eyes: Conjunctivae are normal.  Head: Positive for a laceration to the left side of the forehead with bruising.. Nose: No congestion/rhinnorhea. Mouth/Throat: Mucous membranes are moist.   Neck:  supple no lymphadenopathy noted, mild cervical tenderness is noted Cardiovascular: Normal rate, regular rhythm. Heart sounds are normal Respiratory: Normal respiratory effort.  No retractions, lungs c t a  Abd: soft nontender bs normal all 4 quad GU: deferred Musculoskeletal: FROM all extremities, warm and well perfused Neurologic:  Normal speech and  language.  Cranial nerves II through XII grossly intact Skin:  Skin is warm, dry and intact. No rash noted. Psychiatric: Mood and affect are normal. Speech and behavior are normal.  ____________________________________________   LABS (all labs ordered are listed, but only abnormal results are displayed)  Labs Reviewed - No data to display ____________________________________________   ____________________________________________  RADIOLOGY  CT the head and C-spine are both negative  for any acute abnormality  ____________________________________________   PROCEDURES  Procedure(s) performed: No  Procedures    ____________________________________________   INITIAL IMPRESSION / ASSESSMENT AND PLAN / ED COURSE  Pertinent labs & imaging results that were available during my care of the patient were reviewed by me and considered in my medical decision making (see chart for details).   Patient is a 84 year old male presents emergency department after a fall.  He fell and hit his head at [facility].  He did not lose consciousness.  He states he feels well.  On physical exam patient does appear well.  He does have a bruise and a small laceration to the left side of the forehead.  Steri-Strips are in place from [facility].  Remainder the exam is unremarkable  CT of the head and C-spine are both negative for any acute abnormality.  Explained the findings to the patient and his family member.  Explained to them that the C CT does not show any head bleed.  Gave them have instructions.  Explained to him that I would leave the Steri-Strips in place as the wound is well approximated with Ster[facility]-Strips from [facility].  He was given a Tdap while here in the ED.  He was discharged in stable condition with strict instructions to return if worsening.  He states he understands will comply.  He was discharged in stable condition in the care of his daughter.     As part of my medical decision  making, I reviewed the following data within the [place] History obtained from family, Nursing notes reviewed and incorporated, Old chart reviewed, Radiograph reviewed CT the head and C-spine are negative, Notes from prior ED visits and [place] Controlled Substance Database  ____________________________________________   FINAL CLINICAL IMPRESSION(S) / ED DIAGNOSES  Final diagnoses:  Minor head injury, initial encounter  Fall, initial encounter      NEW MEDICATIONS STARTED DURING THIS VISIT:  Discharge Medication List as of 01/19/2018  3:01 PM       Note:  This document was prepared using Dragon voice recognition software and may include unintentional dictation errors.    Versie Starks, PA-C 01/19/18 1647    Arta Silence, MD 01/22/18 [phone]

## 2018-01-19 NOTE — Discharge Instructions (Addendum)
Follow-up with your regular doctor as needed.  Return emergency department if worsening. 

## 2018-01-19 NOTE — ED Triage Notes (Signed)
Pt in via POV from [facility], reports mechanical fall with walker, approximately 2 inch laceration to left anterior head, steri-strips in place upon arrival, no bleeding noted at this time.  Pt denies LOC, denies blood thinners.  Pt A/Ox4, vitals WDL, NAD noted at this time.

## 2018-01-19 NOTE — ED Notes (Signed)
Pt has bruising and slight swelling to L side of forehead; steri-strips in place over cut--pt states RN at [facility] placed these.

## 2018-01-31 ENCOUNTER — Encounter: Payer: Self-pay | Admitting: Nurse Practitioner

## 2018-01-31 ENCOUNTER — Ambulatory Visit: Payer: Medicare Other | Attending: Nurse Practitioner | Admitting: Nurse Practitioner

## 2018-01-31 ENCOUNTER — Other Ambulatory Visit: Payer: Self-pay

## 2018-01-31 VITALS — BP 115/52 | HR 67 | Temp 98.2°F | Ht 69.0 in | Wt 222.0 lb

## 2018-01-31 DIAGNOSIS — M1612 Unilateral primary osteoarthritis, left hip: Secondary | ICD-10-CM | POA: Insufficient documentation

## 2018-01-31 DIAGNOSIS — G4733 Obstructive sleep apnea (adult) (pediatric): Secondary | ICD-10-CM | POA: Insufficient documentation

## 2018-01-31 DIAGNOSIS — M4726 Other spondylosis with radiculopathy, lumbar region: Secondary | ICD-10-CM | POA: Insufficient documentation

## 2018-01-31 DIAGNOSIS — M6281 Muscle weakness (generalized): Secondary | ICD-10-CM | POA: Diagnosis not present

## 2018-01-31 DIAGNOSIS — G2581 Restless legs syndrome: Secondary | ICD-10-CM | POA: Diagnosis not present

## 2018-01-31 DIAGNOSIS — E785 Hyperlipidemia, unspecified: Secondary | ICD-10-CM | POA: Insufficient documentation

## 2018-01-31 DIAGNOSIS — G894 Chronic pain syndrome: Secondary | ICD-10-CM | POA: Diagnosis not present

## 2018-01-31 DIAGNOSIS — Z7982 Long term (current) use of aspirin: Secondary | ICD-10-CM | POA: Diagnosis not present

## 2018-01-31 DIAGNOSIS — M47816 Spondylosis without myelopathy or radiculopathy, lumbar region: Secondary | ICD-10-CM

## 2018-01-31 DIAGNOSIS — Z79899 Other long term (current) drug therapy: Secondary | ICD-10-CM | POA: Insufficient documentation

## 2018-01-31 DIAGNOSIS — Z5181 Encounter for therapeutic drug level monitoring: Secondary | ICD-10-CM | POA: Diagnosis present

## 2018-01-31 DIAGNOSIS — N181 Chronic kidney disease, stage 1: Secondary | ICD-10-CM | POA: Insufficient documentation

## 2018-01-31 DIAGNOSIS — Z79891 Long term (current) use of opiate analgesic: Secondary | ICD-10-CM | POA: Diagnosis not present

## 2018-01-31 DIAGNOSIS — D649 Anemia, unspecified: Secondary | ICD-10-CM | POA: Diagnosis not present

## 2018-01-31 DIAGNOSIS — M5136 Other intervertebral disc degeneration, lumbar region: Secondary | ICD-10-CM

## 2018-01-31 DIAGNOSIS — Z794 Long term (current) use of insulin: Secondary | ICD-10-CM | POA: Insufficient documentation

## 2018-01-31 DIAGNOSIS — K219 Gastro-esophageal reflux disease without esophagitis: Secondary | ICD-10-CM | POA: Diagnosis not present

## 2018-01-31 DIAGNOSIS — I5032 Chronic diastolic (congestive) heart failure: Secondary | ICD-10-CM | POA: Insufficient documentation

## 2018-01-31 DIAGNOSIS — E1122 Type 2 diabetes mellitus with diabetic chronic kidney disease: Secondary | ICD-10-CM | POA: Insufficient documentation

## 2018-01-31 DIAGNOSIS — M48062 Spinal stenosis, lumbar region with neurogenic claudication: Secondary | ICD-10-CM | POA: Insufficient documentation

## 2018-01-31 DIAGNOSIS — I130 Hypertensive heart and chronic kidney disease with heart failure and stage 1 through stage 4 chronic kidney disease, or unspecified chronic kidney disease: Secondary | ICD-10-CM | POA: Diagnosis not present

## 2018-01-31 DIAGNOSIS — J45909 Unspecified asthma, uncomplicated: Secondary | ICD-10-CM | POA: Diagnosis not present

## 2018-01-31 DIAGNOSIS — M5116 Intervertebral disc disorders with radiculopathy, lumbar region: Secondary | ICD-10-CM | POA: Diagnosis not present

## 2018-01-31 DIAGNOSIS — M792 Neuralgia and neuritis, unspecified: Secondary | ICD-10-CM

## 2018-01-31 MED ORDER — TRAMADOL HCL 50 MG PO TABS: 50.0000 mg | ORAL_TABLET | Freq: Every day | ORAL | 5 refills | Status: DC | PRN

## 2018-01-31 MED ORDER — GABAPENTIN 300 MG PO CAPS: 300.0000 mg | ORAL_CAPSULE | Freq: Four times a day (QID) | ORAL | 5 refills | Status: DC

## 2018-01-31 NOTE — Patient Instructions (Signed)
____________________________________________________________________________________________  Medication Rules  Purpose: To inform patients, and their family members, of our rules and regulations.  Applies to: All patients receiving prescriptions (written or electronic).  Pharmacy of record: Pharmacy where electronic prescriptions will be sent. If written prescriptions are taken to a different pharmacy, please inform the nursing staff. The pharmacy listed in the electronic medical record should be the one where you would like electronic prescriptions to be sent.  Electronic prescriptions: In compliance with the [place] Streng[place] Opioid Misuse Prevention (STOP) Act of 2017 (Session Law 2017-74/H243), effective March 09, 2018, all controlled substances must be electronically prescribed. Calling prescriptions to the pharmacy will cease to exist.  Prescription refills: Only during scheduled appointments. Applies to all prescriptions.  NOTE: The following applies primarily to controlled substances (Opioid* Pain Medications).   Patient's responsibilities: 1. Pain Pills: Bring all pain pills to every appointment (except for procedure appointments). 2. Pill Bottles: Bring pills in original pharmacy bottle. Always bring the newest bottle. Bring bottle, even if empty. 3. Medication refills: You are responsible for knowing and keeping track of what medications you take and those you need refilled. The day before your appointment: write a list of all prescriptions that need to be refilled. The day of the appointment: give the list to the admitting nurse. Prescriptions will be written only during appointments. If you forget a medication: it will not be "Called in", "Faxed", or "electronically sent". You will need to get another appointment to get these prescribed. No early refills. Do not call asking to have your prescription filled early. 4. Prescription Accuracy: You are responsible for  carefully inspecting your prescriptions before leaving our office. Have the discharge nurse carefully go over each prescription with you, before taking them home. Make sure that your name is accurately spelled, that your address is correct. Check the name and dose of your medication to make sure it is accurate. Check the number of pills, and the written instructions to make sure they are clear and accurate. Make sure that you are given enough medication to last until your next medication refill appointment. 5. Taking Medication: Take medication as prescribed. When it comes to controlled substances, taking less pills or less frequently than prescribed is permitted and encouraged. Never take more pills than instructed. Never take medication more frequently than prescribed.  6. Inform other Doctors: Always inform, all of your healthcare providers, of all the medications you take. 7. Pain Medication from other Providers: You are not allowed to accept any additional pain medication from any other Doctor or Healthcare provider. There are two exceptions to this rule. (see below) In the event that you require additional pain medication, you are responsible for notifying us, as stated below. 8. Medication Agreement: You are responsible for carefully reading and following our Medication Agreement. This must be signed before receiving any prescriptions from our practice. Safely store a copy of your signed Agreement. Violations to the Agreement will result in no further prescriptions. (Additional copies of our Medication Agreement are available upon request.) 9. Laws, Rules, & Regulations: All patients are expected to follow all Federal and State Laws, Statutes, Rules, & Regulations. Ignorance of the Laws does not constitute a valid excuse. The use of any illegal substances is prohibited. 10. Adopted CDC guidelines & recommendations: Target dosing levels will be at or below 60 MME/day. Use of benzodiazepines** is not  recommended.  Exceptions: There are only two exceptions to the rule of not receiving pain medications from other Healthcare Providers. 1.   Exception #1 (Emergencies): In the event of an emergency (i.e.: accident requiring emergency care), you are allowed to receive additional pain medication. However, you are responsible for: As soon as you are able, call our office [phone], at any time of the day or night, and leave a message stating your name, the date and nature of the emergency, and the name and dose of the medication prescribed. In the event that your call is answered by a member of our staff, make sure to document and save the date, time, and the name of the person that took your information.  2. Exception #2 (Planned Surgery): In the event that you are scheduled by another doctor or dentist to have any type of surgery or procedure, you are allowed (for a period no longer than 30 days), to receive additional pain medication, for the acute post-op pain. However, in this case, you are responsible for picking up a copy of our "Post-op Pain Management for Surgeons" handout, and giving it to your surgeon or dentist. This document is available at our office, and does not require an appointment to obtain it. Simply go to our office during business hours (Monday-Thursday from 8:00 AM to 4:00 PM) (Friday 8:00 AM to 12:00 Noon) or if you have a scheduled appointment with [place], prior to your surgery, and ask for it by name. In addition, you will need to provide [place] with your name, name of your surgeon, type of surgery, and date of procedure or surgery.  *Opioid medications include: morphine, codeine, oxycodone, oxymorphone, hydrocodone, hydromorphone, meperidine, tramadol, tapentadol, buprenorphine, fentanyl, methadone. **Benzodiazepine medications include: diazepam (Valium), alprazolam (Xanax), clonazepam (Klonopine), lorazepam (Ativan), clorazepate (Tranxene), chlordiazepoxide (Librium), estazolam (Prosom),  oxazepam (Serax), temazepam (Restoril), triazolam (Halcion) (Last updated: 05/06/2017) ____________________________________________________________________________________________   BMI Assessment: Estimated body mass index is 32.78 kg/m as calculated from the following:   Height as of this encounter: 5\' 9"  (1.753 m).   Weight as of this encounter: 222 lb (100.7 kg).  BMI interpretation table: BMI level Category Range association with higher incidence of chronic pain  <18 kg/m2 Underweight   18.5-24.9 kg/m2 Ideal body weight   25-29.9 kg/m2 Overweight Increased incidence by 20%  30-34.9 kg/m2 Obese (Class I) Increased incidence by 68%  35-39.9 kg/m2 Severe obesity (Class II) Increased incidence by 136%  >40 kg/m2 Extreme obesity (Class III) Increased incidence by 254%   Patient's current BMI Ideal Body weight  Body mass index is 32.78 kg/m. Ideal body weight: 70.7 kg (155 lb 13.8 oz) Adjusted ideal body weight: 82.7 kg (182 lb 5.1 oz)   BMI Readings from Last 4 Encounters:  01/31/18 32.78 kg/m  01/19/18 32.49 kg/m  11/16/17 32.19 kg/m  10/26/17 32.19 kg/m   Wt Readings from Last 4 Encounters:  01/31/18 222 lb (100.7 kg)  01/19/18 220 lb (99.8 kg)  11/16/17 218 lb (98.9 kg)  10/26/17 218 lb (98.9 kg)

## 2018-01-31 NOTE — Progress Notes (Addendum)
Patient's Name: Joshua Humphrey  MRN: [id number]  Referring Provider: Kirk Ruths, MD  DOB: 1929/05/09  PCP: Kirk Ruths, MD  DOS: 01/31/2018  Note by: Vevelyn Francois NP  Service setting: Ambulatory outpatient  Specialty: Interventional Pain Management  Location: [facility] (AMB) Pain Management Facility    Patient type: Established    Primary Reason(s) for Visit: Encounter for prescription drug management. (Level of risk: moderate)  CC: Back Pain  HPI  [name] is a 84 y.o. year old, male patient, who comes today for a medication management evaluation. He has Lipoma of neck; Allergic rhinitis; Anemia; Asthma; Cataract; Cortical senile cataract; GERD (gastroesophageal reflux disease); Health care maintenance; Hyperlipidemia; Hypertension; Mass of subcutaneous tissue of back; Obesity; Obstructive sleep apnea; Post-operative state; Prostate cancer ([place]); Restless leg syndrome; Scoliosis (and kyphoscoliosis), idiopathic; Lumbar central spinal stenosis (Severe:L4-5; Mild:L3-4; Moderate:L2-3); Type 2 diabetes mellitus with stage 1 chronic kidney disease ([place]); Chronic pain syndrome; Long term (current) use of opiate analgesic; Long term prescription opiate use; Opiate use (30 MME/Day); Chronic low back pain (Primary Area of Pain) (Bilateral) (R>L); Chronic lower extremity pain (Tertiary source of pain) (Bilateral) (L>R); Abnormal MRI, lumbar spine; Abnormal MRI, cervical spine; Lumbar spinal stenosis with neurogenic claudication; Disturbance of skin sensation; Chronic hip pain (Secondary Area of Pain) (Left); Chronic knee pain (Bilateral) (L>R); Generalized weakness; Muscle weakness (generalized); Chronic Lumbar radicular pain (Left) (L5); Neurogenic pain; Osteoarthritis; Lumbar foraminal stenosis (Right: L4-5 and L5-S1) (Left: L2-3, L3-4, and L4-5); Osteoarthritis of hip (Left); Chronic hip arthralgia (Left); Vitamin D insufficiency; Cervical spinal stenosis; Cervical foraminal stenosis; Lumbar  facet syndrome (Bilateral); Osteoarthritis of lumbar spine; Osteoarthritis of knee (Bilateral); Abnormal knee x-ray (2017); Lumbar facet joint osteoarthritis (Bilateral); Ataxia; Lumbar spondylosis; Spondylosis without myelopathy or radiculopathy, lumbosacral region; DDD (degenerative disc disease), lumbar; Weakness of lower extremities (Bilateral); Edema extremities; Aortic stenosis; Chronic diastolic CHF (congestive heart failure) ([place]); and Uses walker on their problem list. His primarily concern today is the Back Pain  Pain Assessment: Location: Right, Left Knee Onset: More than a month ago Duration: Chronic pain Quality: Aching, Burning Severity: 4 /10 (subjective, self-reported pain score)  Note: Reported level is compatible with observation.                          Effect on ADL: limits my daily activities Timing: Constant Modifying factors: medications and get off my feet BP: (!) 115/52  HR: 67  [name] was last scheduled for an appointment on Visit date not found for medication management. During today's appointment we reviewed Mr. Mcfann chronic pain status, as well as his outpatient medication regimen. He admits that he needs left hip surgery. He admits that he is not able to get this scheduled due to his wife cancer and her repeated visit to [facility]. He would have to have his surgery at [facility] secondary to the small socket in his bone. He has been getting the Hyalgan series ne will call to schedule the next injection.  He admits that he only uses the Tramadol prn. He uses it most a night. He admits that it is not always effective. However he does forget to take it some at night.  The patient  reports that he does not use drugs. His body mass index is 32.78 kg/m.  Further details on both, my assessment(s), as well as the proposed treatment plan, please see below.  Controlled Substance Pharmacotherapy Assessment REMS (Risk Evaluation and Mitigation Strategy)  Analgesic: Tramadol 9m  qd  MME/day:50 mg/day.  BJestin, Burbach RN  02/01/2018 11:52 AM  Signed Nursing Pain Medication Assessment:  Safety precautions to be maintained throughout the outpatient stay will include: orient to surroundings, keep bed in low position, maintain call bell within reach at all times, provide assistance with transfer out of bed and ambulation.  Medication Inspection Compliance: Pill count conducted under aseptic conditions, in front of the patient. Neither the pills nor the bottle was removed from the patient's sight at any time. Once count was completed pills were immediately returned to the patient in their original bottle.  Medication #1: Tramadol (Ultram) Pill/Patch Count: 32 of 30 pills remain Pill/Patch Appearance: Markings consistent with prescribed medication Bottle Appearance: Standard pharmacy container. Clearly labeled. Filled Date: 171/ 14 / 2019 Last Medication intake:  Yesterday  Medication #2: Tramadol (Ultram) Pill/Patch Count: 4 of 30 pills remain Pill/Patch Appearance: Markings consistent with prescribed medication Bottle Appearance: Standard pharmacy container. Clearly labeled. Filled Date: 4 / 20 / 2019 Last Medication intake:  Yesterday   Pharmacokinetics: Liberation and absorption (onset of action): WNL Distribution (time to peak effect): WNL Metabolism and excretion (duration of action): WNL         Pharmacodynamics: Desired effects: Analgesia: Mr. BRondinellireports >50% benefit. Functional ability: Patient reports that medication allows him to accomplish basic ADLs Clinically meaningful improvement in function (CMIF): Sustained CMIF goals met Perceived effectiveness: Described as relatively effective, allowing for increase in activities of daily living (ADL) Undesirable effects: Side-effects or Adverse reactions: None reported Monitoring: [place]: Online review of the past 82-montheriod conducted. Compliant with practice rules and regulations Last UDS on  record: Summary  Date Value Ref Range Status  09/08/2016 FINAL  Final    Comment:    ==================================================================== TOXASSURE COMP DRUG ANALYSIS,UR ==================================================================== Test                             Result       Flag       Units Drug Present and Declared for Prescription Verification   Gabapentin                     PRESENT      EXPECTED   Acetaminophen                  PRESENT      EXPECTED Drug Absent but Declared for Prescription Verification   Hydrocodone                    Not Detected UNEXPECTED ng/mg creat   Tramadol                       Not Detected UNEXPECTED   Salicylate                     Not Detected UNEXPECTED    Aspirin, as indicated in the declared medication list, is not    always detected even when used as directed. ==================================================================== Test                      Result    Flag   Units      Ref Range   Creatinine              23  mg/dL      >=20 ==================================================================== Declared Medications:  The flagging and interpretation on this report are based on the  following declared medications.  Unexpected results may arise from  inaccuracies in the declared medications.  **Note: The testing scope of this panel includes these medications:  Gabapentin  Hydrocodone (Norco)  Tramadol (Ultram)  **Note: The testing scope of this panel does not include small to  moderate amounts of these reported medications:  Acetaminophen (Norco)  Acetaminophen (Tylenol)  Aspirin (Aspirin 81)  **Note: The testing scope of this panel does not include following  reported medications:  Albuterol (Combivent)  Atorvastatin (Lipitor)  Calcium Carbonate (Calcium carbonate/Vitamin D)  Carvedilol (Coreg)  Cephalexin (Keflex)  Cetirizine (Zyrtec)  Chondroitin (Glucosamine-Chondroitin)  Ezetimibe  (Vytorin)  Fluticasone (Flonase)  Formoterol (Dulera)  Furosemide (Lasix)  Glucosamine (Glucosamine-Chondroitin)  Hydralazine  Ipratropium (Combivent)  Iron  Leuprolide Acetate (Lupron)  Losartan (Cozaar)  Meloxicam (Mobic)  Metformin (Glucophage)  Mometasone (Dulera)  Montelukast (Singulair)  Multivitamin  Nifedipine  Omeprazole  Potassium  Pramipexole (Mirapex)  Simvastatin (Vytorin)  Sitagliptin (Januvia)  Supplement  Vitamin D (Calcium carbonate/Vitamin D) ==================================================================== For clinical consultation, please call [phone]. ====================================================================    UDS interpretation: Not applicable          Medication Assessment Form: Not applicable. Initial evaluation. The patient has not received any medications from our practice Treatment compliance:  Risk Assessment Profile: Aberrant behavior: See prior evaluations. None observed or detected today Comorbid factors increasing risk of overdose: See prior notes. No additional risks detected today Opioid risk tool (ORT) (Total Score): 0 Personal History of Substance Abuse (SUD-Substance use disorder):  Alcohol: Negative  Illegal Drugs: Negative  Rx Drugs: Negative  ORT Risk Level calculation: Low Risk Risk of substance use disorder (SUD): Low Opioid Risk Tool - 01/31/18 1126      Family History of Substance Abuse   Alcohol  Negative    Illegal Drugs  Negative    Rx Drugs  Negative      Personal History of Substance Abuse   Alcohol  Negative    Illegal Drugs  Negative    Rx Drugs  Negative      Age   Age between 32-45 years   No      History of Preadolescent Sexual Abuse   History of Preadolescent Sexual Abuse  Negative or Male      Psychological Disease   Psychological Disease  Negative    Depression  Negative      Total Score   Opioid Risk Tool Scoring  0    Opioid Risk Interpretation  Low Risk      ORT  Scoring interpretation table:  Score <3 = Low Risk for SUD  Score between 4-7 = Moderate Risk for SUD  Score >8 = High Risk for Opioid Abuse   Risk Mitigation Strategies:  Patient Counseling: Covered Patient-Prescriber Agreement (PPA): Present and active  Notification to other healthcare providers: Done  Pharmacologic Plan: No change in therapy, at this time.             Laboratory Chemistry  Inflammation Markers (CRP: Acute Phase) (ESR: Chronic Phase) Lab Results  Component Value Date   CRP 0.7 09/08/2016   ESRSEDRATE 16 09/08/2016                         Rheumatology Markers No results found for: RF, ANA, [place], [place], [place], [place], HLAB27  Renal Function Markers Lab Results  Component Value Date   BUN 20 09/08/2016   CREATININE 0.75 (L) 09/08/2016   BCR 27 (H) 09/08/2016   GFRAA 97 09/08/2016   GFRNONAA 84 09/08/2016                             Hepatic Function Markers Lab Results  Component Value Date   AST 17 09/08/2016   ALT 15 09/08/2016   ALBUMIN 4.2 09/08/2016   ALKPHOS 64 09/08/2016                        Electrolytes Lab Results  Component Value Date   NA 144 09/08/2016   K 3.8 09/08/2016   CL 105 09/08/2016   CALCIUM 9.2 09/08/2016   MG 1.8 09/08/2016                        Neuropathy Markers Lab Results  Component Value Date   VITAMINB12 264 09/08/2016                        CNS Tests No results found for: COLORCSF, APPEARCSF, RBCCOUNTCSF, WBCCSF, POLYSCSF, LYMPHSCSF, EOSCSF, PROTEINCSF, GLUCCSF, JCVIRUS, CSFOLI, IGGCSF                      Bone Pathology Markers Lab Results  Component Value Date   25OHVITD1 41 09/08/2016   25OHVITD2 <1.0 09/08/2016   25OHVITD3 41 09/08/2016                         Coagulation Parameters Lab Results  Component Value Date   PLT 275 01/20/2016                        Cardiovascular Markers Lab Results  Component Value Date   HGB 12.1 (L) 01/20/2016   HCT 36.3  (L) 01/20/2016                         CA Markers No results found for: CEA, CA125, LABCA2                      Note: Lab results reviewed.  Recent Diagnostic Imaging Results  CT Cervical Spine Wo Contrast CLINICAL DATA:  Patient fell. 2 inch laceration to left anterior head. Patient denies loss of consciousness.  EXAM: CT HEAD WITHOUT CONTRAST  CT CERVICAL SPINE WITHOUT CONTRAST  TECHNIQUE: Multidetector CT imaging of the head and cervical spine was performed following the standard protocol without intravenous contrast. Multiplanar CT image reconstructions of the cervical spine were also generated.  COMPARISON:  None.  FINDINGS: CT HEAD FINDINGS  Brain: No evidence of acute infarction, hemorrhage, hydrocephalus, extra-axial collection or mass lesion/mass effect.  There is ventricular and sulcal enlargement reflecting mild diffuse atrophy. Patchy areas of white matter hypoattenuation are noted consistent with mild chronic microvascular ischemic change  Vascular: No hyperdense vessel or unexpected calcification.  Skull: Normal. Negative for fracture or focal lesion.  Sinuses/Orbits: Globes and orbits are unremarkable. The visualized sinuses and mastoid air cells are clear.  Other: Left frontal scalp laceration with small bubbles of air in the soft tissues.  CT CERVICAL SPINE FINDINGS  Alignment: Normal.  Skull base and vertebrae: No acute fracture. No primary bone lesion or focal  pathologic process.  Soft tissues and spinal canal: No prevertebral fluid or swelling. No visible canal hematoma.  Disc levels: Moderate to marked loss of disc height from C3-C4 through C7-T1. Mild spondylotic disc bulging with endplate spurring throughout these levels. There are bilateral facet degenerative changes, greatest on the left at C4-C5. No convincing disc herniation.  Upper chest: No mass or adenopathy. No acute findings. Lung apices are clear.  Other:  None.  IMPRESSION: HEAD CT  1. No acute intracranial abnormalities.  No skull fracture. 2. Mild atrophy and chronic microvascular ischemic change. 3. Left frontal scalp laceration.  CERVICAL CT  1. No fracture or acute finding.  Electronically Signed   By: Lajean Manes M.D.   On: 01/19/2018 14:56 CT Head Wo Contrast CLINICAL DATA:  Patient fell. 2 inch laceration to left anterior head. Patient denies loss of consciousness.  EXAM: CT HEAD WITHOUT CONTRAST  CT CERVICAL SPINE WITHOUT CONTRAST  TECHNIQUE: Multidetector CT imaging of the head and cervical spine was performed following the standard protocol without intravenous contrast. Multiplanar CT image reconstructions of the cervical spine were also generated.  COMPARISON:  None.  FINDINGS: CT HEAD FINDINGS  Brain: No evidence of acute infarction, hemorrhage, hydrocephalus, extra-axial collection or mass lesion/mass effect.  There is ventricular and sulcal enlargement reflecting mild diffuse atrophy. Patchy areas of white matter hypoattenuation are noted consistent with mild chronic microvascular ischemic change  Vascular: No hyperdense vessel or unexpected calcification.  Skull: Normal. Negative for fracture or focal lesion.  Sinuses/Orbits: Globes and orbits are unremarkable. The visualized sinuses and mastoid air cells are clear.  Other: Left frontal scalp laceration with small bubbles of air in the soft tissues.  CT CERVICAL SPINE FINDINGS  Alignment: Normal.  Skull base and vertebrae: No acute fracture. No primary bone lesion or focal pathologic process.  Soft tissues and spinal canal: No prevertebral fluid or swelling. No visible canal hematoma.  Disc levels: Moderate to marked loss of disc height from C3-C4 through C7-T1. Mild spondylotic disc bulging with endplate spurring throughout these levels. There are bilateral facet degenerative changes, greatest on the left at C4-C5. No convincing  disc herniation.  Upper chest: No mass or adenopathy. No acute findings. Lung apices are clear.  Other: None.  IMPRESSION: HEAD CT  1. No acute intracranial abnormalities.  No skull fracture. 2. Mild atrophy and chronic microvascular ischemic change. 3. Left frontal scalp laceration.  CERVICAL CT  1. No fracture or acute finding.  Electronically Signed   By: Lajean Manes M.D.   On: 01/19/2018 14:56  Complexity Note: Imaging results reviewed. Results shared with Mr. Warmuth, using Layman's terms.                         Meds   Current Outpatient Medications:  .  acetaminophen (TYLENOL) 650 MG CR tablet, Take 650-1,300 mg by mouth every 8 (eight) hours as needed (for pain.). , Disp: , Rfl:  .  aspirin EC 81 MG tablet, Take 81 mg by mouth daily. , Disp: , Rfl:  .  atorvastatin (LIPITOR) 40 MG tablet, Take 40 mg by mouth daily. , Disp: , Rfl:  .  B-D ULTRAFINE III SHORT PEN 31G X 8 MM MISC, USE BID UTD, Disp: , Rfl: 11 .  benzonatate (TESSALON) 200 MG capsule, Take 200 mg by mouth as needed., Disp: , Rfl: 0 .  Calcium Carbonate-Vitamin D (CALTRATE 600+D PO), Take 2 tablets by mouth daily., Disp: , Rfl:  .  carvedilol (COREG) 25 MG tablet, TAKE 1 TABLET (25 MG) TWICE DAILY, Disp: , Rfl:  .  cetirizine (ZYRTEC) 10 MG tablet, Take 10 mg by mouth every evening. , Disp: , Rfl:  .  ferrous fumarate (HEMOCYTE - 106 MG FE) 325 (106 FE) MG TABS tablet, Take 1 tablet by mouth 2 (two) times daily. , Disp: , Rfl:  .  FLAXSEED, LINSEED, PO, Take by mouth daily., Disp: , Rfl:  .  fluticasone (FLONASE) 50 MCG/ACT nasal spray, Place 1-2 sprays into both nostrils daily as needed (for allergies.). , Disp: , Rfl:  .  furosemide (LASIX) 40 MG tablet, TAKE 1 TABLET BY MOUTH TWICE DAILY, Disp: , Rfl:  .  gabapentin (NEURONTIN) 300 MG capsule, Take 1-3 capsules (300-900 mg total) by mouth 4 (four) times daily. Follow titration schedule., Disp: 360 capsule, Rfl: 5 .  Glucosamine-Chondroit-Vit C-Mn  (GLUCOSAMINE-CHONDROITIN) CAPS, Take 2 capsules by mouth daily., Disp: , Rfl:  .  hydrALAZINE (APRESOLINE) 50 MG tablet, Take 50 mg by mouth 3 (three) times daily. , Disp: , Rfl:  .  Ipratropium-Albuterol (COMBIVENT RESPIMAT) 20-100 MCG/ACT AERS respimat, INHALE 2 PUFF FOUR TIMES DAILY AS NEEDED FOR SHORTNESS OF BREATH AND/OR WHEEZING., Disp: , Rfl:  .  LEVEMIR FLEXTOUCH 100 UNIT/ML Pen, Inject 15 Units into the skin every evening. , Disp: , Rfl:  .  losartan (COZAAR) 100 MG tablet, Take 100 mg by mouth daily. , Disp: , Rfl:  .  magnesium oxide (MAG-OX) 400 MG tablet, Take 400 mg by mouth daily., Disp: , Rfl:  .  metFORMIN (GLUCOPHAGE) 850 MG tablet, Take 850 mg by mouth 2 (two) times daily. , Disp: , Rfl:  .  mometasone-formoterol (DULERA) 200-5 MCG/ACT AERO, Inhale 2 puffs into the lungs 2 (two) times daily. , Disp: , Rfl:  .  montelukast (SINGULAIR) 10 MG tablet, Take 10 mg by mouth at bedtime., Disp: , Rfl:  .  Multiple Vitamins-Minerals (OCUVITE ADULT 50+) CAPS, Take 1 capsule by mouth daily. , Disp: , Rfl:  .  NIFEdipine (PROCARDIA XL/ADALAT-CC) 90 MG 24 hr tablet, Take 90 mg by mouth daily. , Disp: , Rfl:  .  omeprazole (PRILOSEC) 20 MG capsule, Take 20 mg by mouth daily., Disp: , Rfl:  .  potassium chloride SA (K-DUR,KLOR-CON) 20 MEQ tablet, TAKE 1 TABLET(20 MEQ) BY MOUTH TWICE DAILY, Disp: , Rfl:  .  pramipexole (MIRAPEX) 0.25 MG tablet, Take 0.25 mg by mouth at bedtime as needed (for restless leg syndrome). , Disp: , Rfl:  .  sitaGLIPtin (JANUVIA) 100 MG tablet, Take 100 mg by mouth daily., Disp: , Rfl:  .  torsemide (DEMADEX) 20 MG tablet, Take by mouth., Disp: , Rfl:  .  [START ON 02/23/2018] traMADol (ULTRAM) 50 MG tablet, Take 1 tablet (50 mg total) by mouth daily as needed for severe pain., Disp: 30 tablet, Rfl: 5  ROS  Constitutional: Denies any fever or chills Gastrointestinal: No reported hemesis, hematochezia, vomiting, or acute GI distress Musculoskeletal: Denies any acute  onset joint swelling, redness, loss of ROM, or weakness Neurological: No reported episodes of acute onset apraxia, aphasia, dysarthria, agnosia, amnesia, paralysis, loss of coordination, or loss of consciousness  Allergies  Mr. Longoria has No Known Allergies.  [place]  Drug: Mr. Ryker  reports that he does not use drugs. Alcohol:  reports that he drinks alcohol. Tobacco:  reports that he has never smoked. He has never used smokeless tobacco. Medical:  has a past medical history of Anemia, Asthma, Cancer ([place]),  Cancer ([place]), Diabetes mellitus without complication ([place]), GERD (gastroesophageal reflux disease), Hypertension, PONV (postoperative nausea and vomiting), Respiratory infection (08/23/2017), Septic olecranon bursitis of left elbow (01/22/2016), and Sleep apnea. Surgical: Mr. Antrim  has a past surgical history that includes tumor removed (2000); Prostate surgery (2002); lipoma removal (2000); Tonsillectomy; Cataract extraction, bilateral; Toe Surgery; Olecranon bursectomy (Left, 01/21/2016); Elbow surgery; and Eye surgery. Family: family history includes Alcohol abuse in his father; Cancer in his brother and mother.  Constitutional Exam  General appearance: Well nourished, well developed, and well hydrated. In no apparent acute distress Vitals:   01/31/18 1120  BP: (!) 115/52  Pulse: 67  Temp: 98.2 F (36.8 C)  SpO2: (!) 89%  Weight: 222 lb (100.7 kg)  Height: _0  (1.753 m)  Psych/Mental status: Alert, oriented x 3 (person, place, & time)       Eyes: PERLA Respiratory: No evidence of acute respiratory distress  Cervical Spine Area Exam  Skin & Axial Inspection: No masses, redness, edema, swelling, or associated skin lesions Alignment: Symmetrical Functional ROM: Unrestricted ROM      Stability: No instability detected Muscle Tone/Strength: Functionally intact. No obvious neuro-muscular anomalies detected. Sensory (Neurological): Unimpaired Palpation: No palpable anomalies               Lumbar Spine Area Exam  Skin & Axial Inspection: No masses, redness, or swelling Alignment: Symmetrical Functional ROM: Unrestricted ROM       Stability: No instability detected Muscle Tone/Strength: Functionally intact. No obvious neuro-muscular anomalies detected. Sensory (Neurological): Unimpaired Palpation: Tender        Gait & Posture Assessment  Ambulation: Patient ambulates using a walker Gait: Relatively normal for age and body habitus Posture: WNL   Lower Extremity Exam    Side: Right lower extremity  Side: Left lower extremity  Stability: No instability observed          Stability: No instability observed          Skin & Extremity Inspection: Skin color, temperature, and hair growth are WNL. No peripheral edema or cyanosis. No masses, redness, swelling, asymmetry, or associated skin lesions. No contractures.  Skin & Extremity Inspection: Skin color, temperature, and hair growth are WNL. No peripheral edema or cyanosis. No masses, redness, swelling, asymmetry, or associated skin lesions. No contractures.  Functional ROM: Unrestricted ROM                  Functional ROM: Decreased ROM                  Muscle Tone/Strength: Functionally intact. No obvious neuro-muscular anomalies detected.  Muscle Tone/Strength: Functionally intact. No obvious neuro-muscular anomalies detected.  Sensory (Neurological): Unimpaired        Sensory (Neurological): Unimpaired        Palpation: No palpable anomalies  Palpation: No palpable anomalies   Assessment  Primary Diagnosis & Pertinent Problem List: The primary encounter diagnosis was Lumbar spondylosis. Diagnoses of DDD (degenerative disc disease), lumbar, Osteoarthritis of hip (Left), Muscle weakness (generalized), Neurogenic pain, and Chronic pain syndrome were also pertinent to this visit.  Status Diagnosis  Controlled Controlled Controlled 1. Lumbar spondylosis   2. DDD (degenerative disc disease), lumbar   3. Osteoarthritis of  hip (Left)   4. Muscle weakness (generalized)   5. Neurogenic pain   6. Chronic pain syndrome     Problems updated and reviewed during this visit: No problems updated. Plan of Care  Pharmacotherapy (Medications Ordered): Meds ordered this encounter  Medications  .  gabapentin (NEURONTIN) 300 MG capsule    Sig: Take 1-3 capsules (300-900 mg total) by mouth 4 (four) times daily. Follow titration schedule.    Dispense:  360 capsule    Refill:  5    Do not place this medication, or any other prescription from our practice, on "Automatic Refill". Patient may have prescription filled one day early if pharmacy is closed on scheduled refill date.    Order Specific Question:   Supervising Provider    Answer:   Milinda Pointer [phone]  . traMADol (ULTRAM) 50 MG tablet    Sig: Take 1 tablet (50 mg total) by mouth daily as needed for severe pain.    Dispense:  30 tablet    Refill:  5    Do not place this medication, or any other prescription from our practice, on "Automatic Refill". Patient may have prescription filled one day early if pharmacy is closed on scheduled refill date.    Order Specific Question:   Supervising Provider    Answer:   Milinda Pointer [[id number]]   New Prescriptions   No medications on file   Medications administered today: Isreal Moline. [place] had no medications administered during this visit. Lab-work, procedure(s), and/or referral(s): No orders of the defined types were placed in this encounter.  Imaging and/or referral(s): None Interventional management options: Planned, scheduled, and/or pending: Palliative bilateral IA Hyalgan knee injection #1 (S2) + Left IA Hip joint injection #2 As scheduled.    Considering: Diagnostic left intra-articular hip joint injection Possible diagnostic left femoral nerve + obturator nerve block Possible left femoral nerve + obturator nerve RFA Diagnostic left L2-3 interlaminar lumbar epiduralsteroid  injection Diagnostic left L3-4 interlaminar lumbar epiduralsteroid injection Diagnosticleft L4-5 interlaminar lumbar epiduralsteroid injection Diagnosticleft L2-3, L3-4 and L4-5 transforaminal epiduralsteroid injection  Diagnostic right L4-5 and L5-S1 transforaminal epiduralsteroid injection  Diagnostic bilateral lumbar facet block Possible bilateral lumbar facet RFA Diagnostic bilateral intra-articular knee joint injection with local anesthetic and steroids Possible series of 5 bilateral intra-articular Hyalgan knee injections Diagnostic bilateral genicular nerve block Possible bilateral genicular nerve RFA   Palliative PRN treatment(s): Palliative/Diagnostic bilateral lumbar facet block#2  Palliativeleft intra-articular hip joint injection Diagnostic left L2-3 interlaminar lumbar epiduralsteroid injection Diagnostic left L3-4 interlaminar lumbar epiduralsteroid injection Diagnosticleft L4-5 interlaminar lumbar epiduralsteroid injection Diagnosticleft L2-3, L3-4 and L4-5 transforaminal epiduralsteroid injection  Diagnostic right L4-5 and L5-S1 transforaminal epiduralsteroid injection     Provider-requested follow-up: Return in about 6 months (around 08/01/2018) for MedMgmt.  Future Appointments  Date Time Provider [place]  05/02/2018  2:15 PM Abbie Sons, MD [facility] None  07/27/2018 11:00 AM Vevelyn Francois, NP [facility] None   Primary Care Physician: Kirk Ruths, MD Location: [facility] Note by: Vevelyn Francois NP Date: 01/31/2018; Time: 12:15 PM  Pain Score Disclaimer: We use the NRS-11 scale. This is a self-reported, subjective measurement of pain severity with only modest accuracy. It is used primarily to identify changes within a particular patient. It must be understood that outpatient pain scales are significantly less accurate that those used for research, where they can be applied under ideal  controlled circumstances with minimal exposure to variables. In reality, the score is likely to be a combination of pain intensity and pain affect, where pain affect describes the degree of emotional arousal or changes in action readiness caused by the sensory experience of pain. Factors such as social and work situation, setting, emotional state, anxiety levels, expectation, and prior pain experience may  influence pain perception and show large inter-individual differences that may also be affected by time variables.  Patient instructions provided during this appointment: Patient Instructions   ____________________________________________________________________________________________  Medication Rules  Purpose: To inform patients, and their family members, of our rules and regulations.  Applies to: All patients receiving prescriptions (written or electronic).  Pharmacy of record: Pharmacy where electronic prescriptions will be sent. If written prescriptions are taken to a different pharmacy, please inform the nursing staff. The pharmacy listed in the electronic medical record should be the one where you would like electronic prescriptions to be sent.  Electronic prescriptions: In compliance with the [place] (STOP) Act of 2017 (Session Lanny Cramp [phone]), effective March 09, 2018, all controlled substances must be electronically prescribed. Calling prescriptions to the pharmacy will cease to exist.  Prescription refills: Only during scheduled appointments. Applies to all prescriptions.  NOTE: The following applies primarily to controlled substances (Opioid* Pain Medications).   Patient's responsibilities: 1. Pain Pills: Bring all pain pills to every appointment (except for procedure appointments). 2. Pill Bottles: Bring pills in original pharmacy bottle. Always bring the newest bottle. Bring bottle, even if empty. 3. Medication refills: You are  responsible for knowing and keeping track of what medications you take and those you need refilled. The day before your appointment: write a list of all prescriptions that need to be refilled. The day of the appointment: give the list to the admitting nurse. Prescriptions will be written only during appointments. If you forget a medication: it will not be "Called in", "Faxed", or "electronically sent". You will need to get another appointment to get these prescribed. No early refills. Do not call asking to have your prescription filled early. 4. Prescription Accuracy: You are responsible for carefully inspecting your prescriptions before leaving our office. Have the discharge nurse carefully go over each prescription with you, before taking them home. Make sure that your name is accurately spelled, that your address is correct. Check the name and dose of your medication to make sure it is accurate. Check the number of pills, and the written instructions to make sure they are clear and accurate. Make sure that you are given enough medication to last until your next medication refill appointment. 5. Taking Medication: Take medication as prescribed. When it comes to controlled substances, taking less pills or less frequently than prescribed is permitted and encouraged. Never take more pills than instructed. Never take medication more frequently than prescribed.  6. Inform other Doctors: Always inform, all of your healthcare providers, of all the medications you take. 7. Pain Medication from other Providers: You are not allowed to accept any additional pain medication from any other Doctor or Healthcare provider. There are two exceptions to this rule. (see below) In the event that you require additional pain medication, you are responsible for notifying us, as stated below. 8. Medication Agreement: You are responsible for carefully reading and following our Medication Agreement. This must be signed before  receiving any prescriptions from our practice. Safely store a copy of your signed Agreement. Violations to the Agreement will result in no further prescriptions. (Additional copies of our Medication Agreement are available upon request.) 9. Laws, Rules, & Regulations: All patients are expected to follow all Federal and [employer], Rules, [employer]. Ignorance of the Laws does not constitute a valid excuse. The use of any illegal substances is prohibited. 10. Adopted CDC guidelines & recommendations: Target dosing levels will be at or below 60  MME/day. Use of benzodiazepines** is not recommended.  Exceptions: There are only two exceptions to the rule of not receiving pain medications from other Healthcare Providers. 1. Exception #1 (Emergencies): In the event of an emergency (i.e.: accident requiring emergency care), you are allowed to receive additional pain medication. However, you are responsible for: As soon as you are able, call our office [phone], at any time of the day or night, and leave a message stating your name, the date and nature of the emergency, and the name and dose of the medication prescribed. In the event that your call is answered by a member of our staff, make sure to document and save the date, time, and the name of the person that took your information.  2. Exception #2 (Planned Surgery): In the event that you are scheduled by another doctor or dentist to have any type of surgery or procedure, you are allowed (for a period no longer than 30 days), to receive additional pain medication, for the acute post-op pain. However, in this case, you are responsible for picking up a copy of our "Post-op Pain Management for Surgeons" handout, and giving it to your surgeon or dentist. This document is available at our office, and does not require an appointment to obtain it. Simply go to our office during business hours (Monday-Thursday from 8:00 AM to 4:00 PM) (Friday 8:00 AM to  12:00 Noon) or if you have a scheduled appointment with [place], prior to your surgery, and ask for it by name. In addition, you will need to provide [place] with your name, name of your surgeon, type of surgery, and date of procedure or surgery.  *Opioid medications include: morphine, codeine, oxycodone, oxymorphone, hydrocodone, hydromorphone, meperidine, tramadol, tapentadol, buprenorphine, fentanyl, methadone. **Benzodiazepine medications include: diazepam (Valium), alprazolam (Xanax), clonazepam (Klonopine), lorazepam (Ativan), clorazepate (Tranxene), chlordiazepoxide (Librium), estazolam (Prosom), oxazepam (Serax), temazepam (Restoril), triazolam (Halcion) (Last updated: 05/06/2017) ____________________________________________________________________________________________   BMI Assessment: Estimated body mass index is 32.78 kg/m as calculated from the following:   Height as of this encounter: _0  (1.753 m).   Weight as of this encounter: 222 lb (100.7 kg).  BMI interpretation table: BMI level Category Range association with higher incidence of chronic pain  <18 kg/m2 Underweight   18.5-24.9 kg/m2 Ideal body weight   25-29.9 kg/m2 Overweight Increased incidence by 20%  30-34.9 kg/m2 Obese (Class I) Increased incidence by 68%  35-39.9 kg/m2 Severe obesity (Class II) Increased incidence by 136%  >40 kg/m2 Extreme obesity (Class III) Increased incidence by 254%   Patient's current BMI Ideal Body weight  Body mass index is 32.78 kg/m. Ideal body weight: 70.7 kg (155 lb 13.8 oz) Adjusted ideal body weight: 82.7 kg (182 lb 5.1 oz)   BMI Readings from Last 4 Encounters:  01/31/18 32.78 kg/m  01/19/18 32.49 kg/m  11/16/17 32.19 kg/m  10/26/17 32.19 kg/m   Wt Readings from Last 4 Encounters:  01/31/18 222 lb (100.7 kg)  01/19/18 220 lb (99.8 kg)  11/16/17 218 lb (98.9 kg)  10/26/17 218 lb (98.9 kg)

## 2018-01-31 NOTE — Progress Notes (Signed)
Nursing Pain Medication Assessment:  Safety precautions to be maintained throughout the outpatient stay will include: orient to surroundings, keep bed in low position, maintain call bell within reach at all times, provide assistance with transfer out of bed and ambulation.  Medication Inspection Compliance: Pill count conducted under aseptic conditions, in front of the patient. Neither the pills nor the bottle was removed from the patient's sight at any time. Once count was completed pills were immediately returned to the patient in their original bottle.  Medication #1: Tramadol (Ultram) Pill/Patch Count: 32 of 30 pills remain Pill/Patch Appearance: Markings consistent with prescribed medication Bottle Appearance: Standard pharmacy container. Clearly labeled. Filled Date: 60 / 14 / 2019 Last Medication intake:  Yesterday  Medication #2: Tramadol (Ultram) Pill/Patch Count: 4 of 30 pills remain Pill/Patch Appearance: Markings consistent with prescribed medication Bottle Appearance: Standard pharmacy container. Clearly labeled. Filled Date: 4 / 20 / 2019 Last Medication intake:  Yesterday

## 2018-02-01 ENCOUNTER — Encounter: Payer: Self-pay | Admitting: Nurse Practitioner

## 2018-02-01 ENCOUNTER — Other Ambulatory Visit: Payer: Self-pay

## 2018-02-20 IMAGING — CT CT HIP*L* W/O CM
1 series · 15 of 32 positions shown, 19 images · non-contrast
Comparison: None.

CLINICAL DATA: Left hip osteoarthritis.  Preoperative planning.

EXAM:
CT OF THE LEFT HIP WITHOUT CONTRAST
TECHNIQUE: Multidetector CT imaging of the left hip was performed according to
the standard protocol. Multiplanar CT image reconstructions were
also generated.

[Series 5: axial soft tissue · axial · 0.48mm/px · z∈[-988,-756]mm · 15 of 130 slices shown, 19 images]
[im 9/130  soft-tissue]
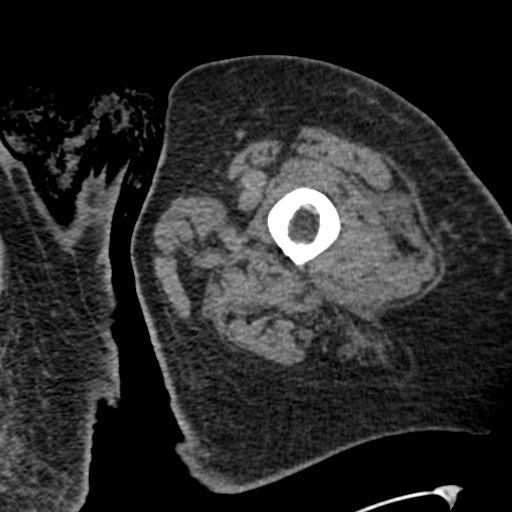
[im 9/130  bone]
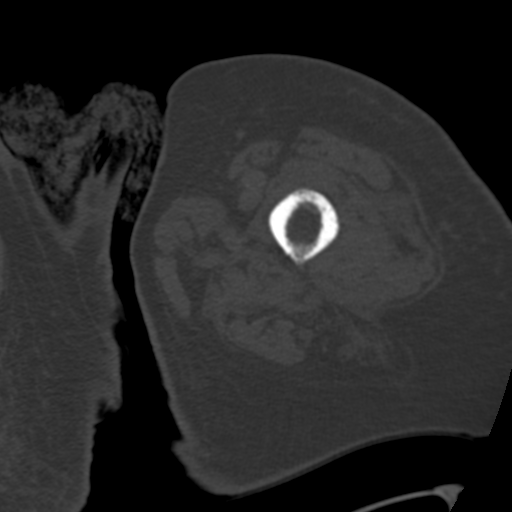
[im 17/130  soft-tissue]
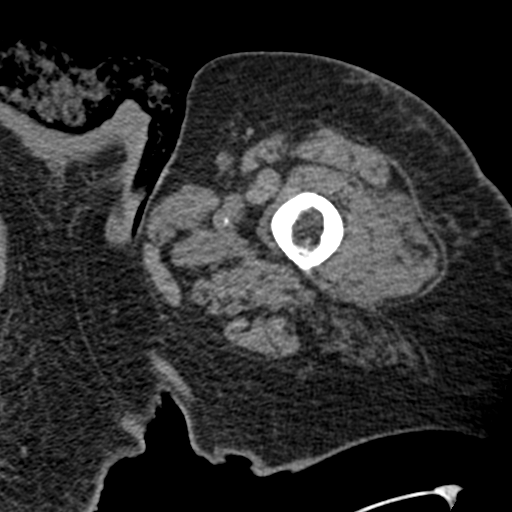
[im 25/130  soft-tissue]
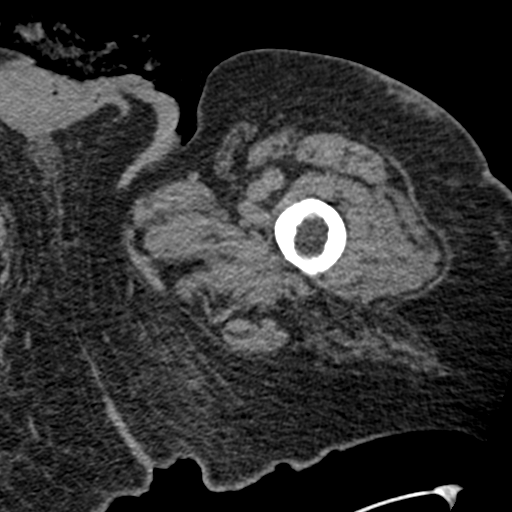
[im 38/130  soft-tissue]
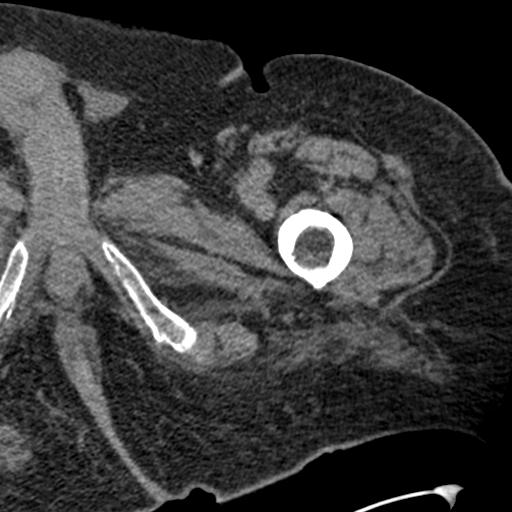
[im 46/130  soft-tissue]
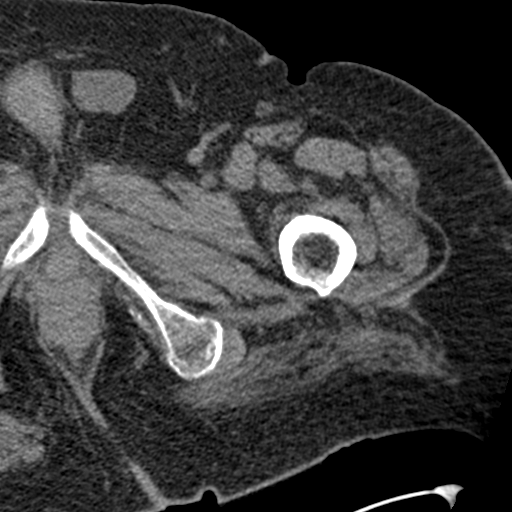
[im 55/130  soft-tissue]
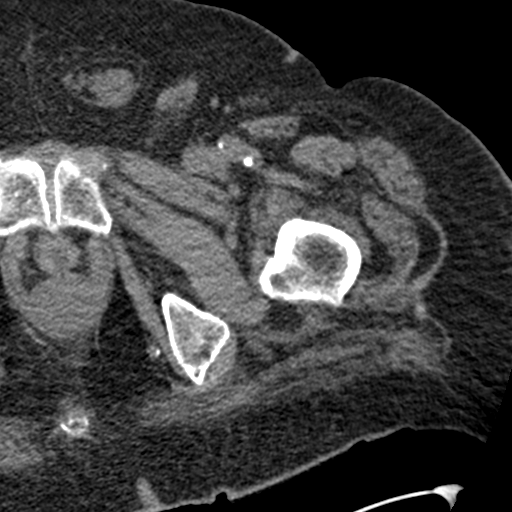
[im 67/130  soft-tissue]
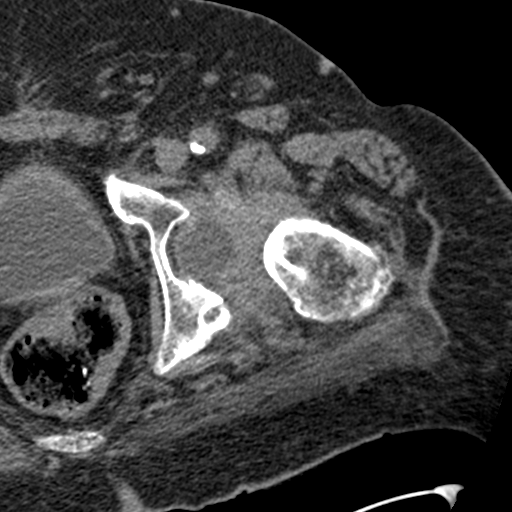
[im 75/130  soft-tissue]
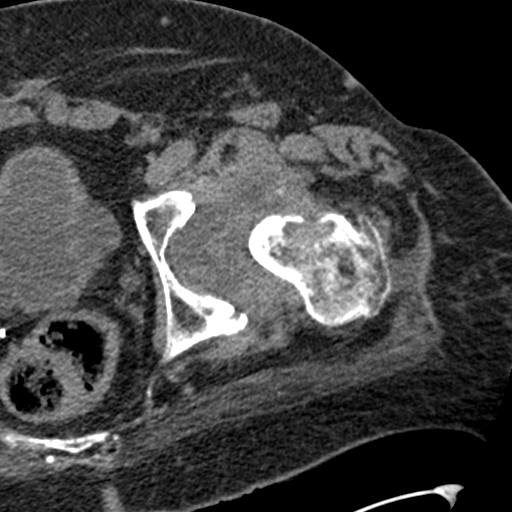
[im 84/130  soft-tissue]
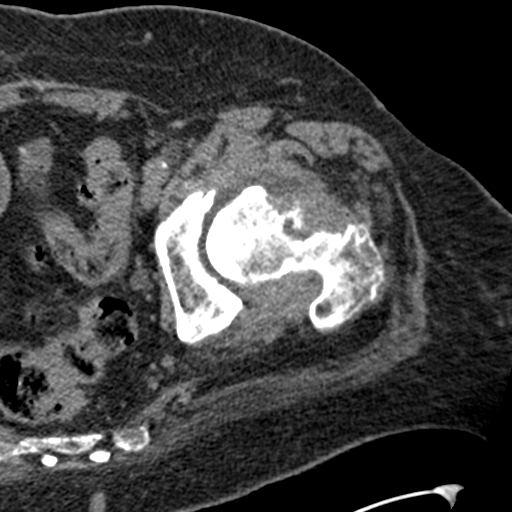
[im 84/130  bone]
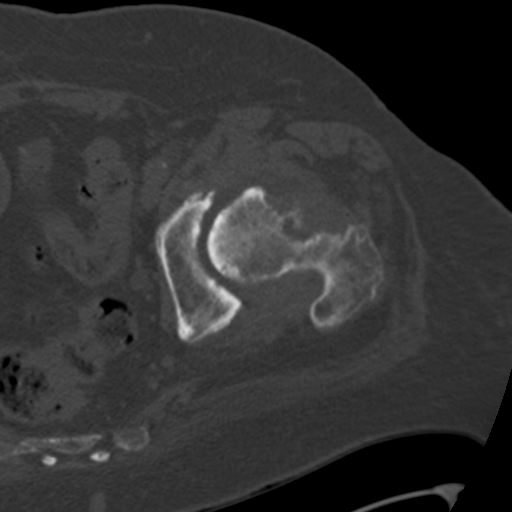
[im 92/130  soft-tissue]
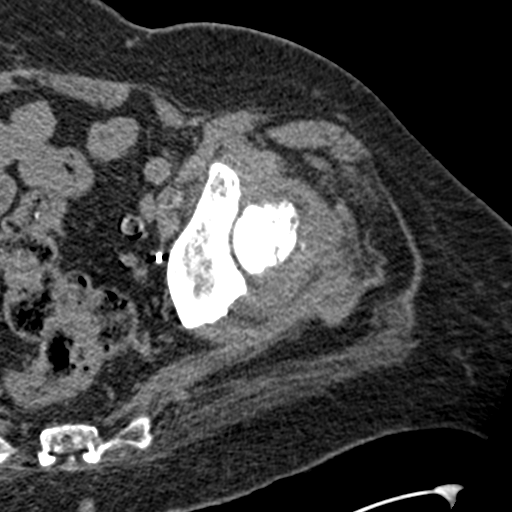
[im 105/130  soft-tissue]
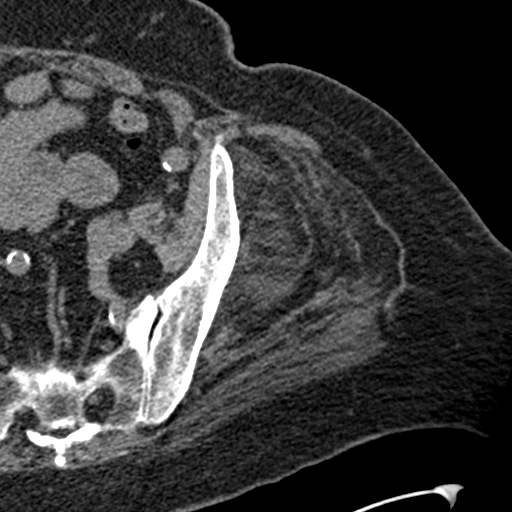
[im 113/130  soft-tissue]
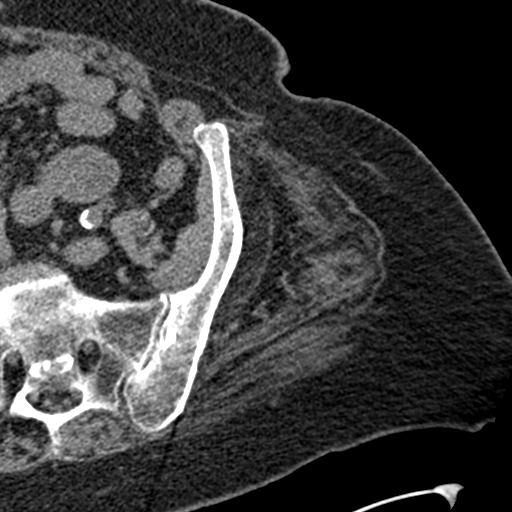
[im 113/130  lung]
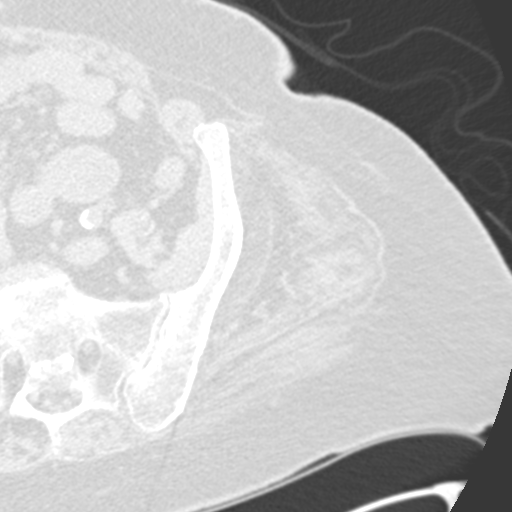
[im 117/130  lung]
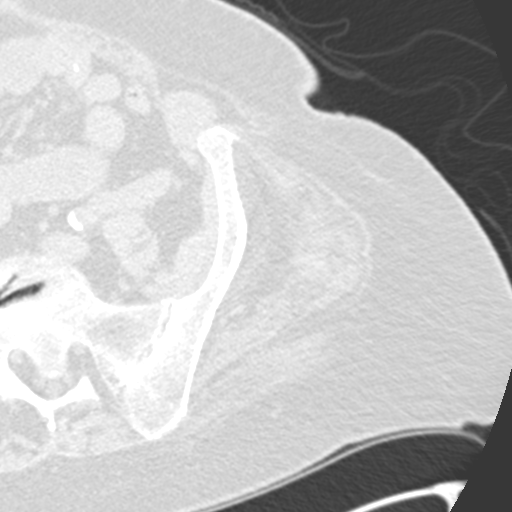
[im 121/130  soft-tissue]
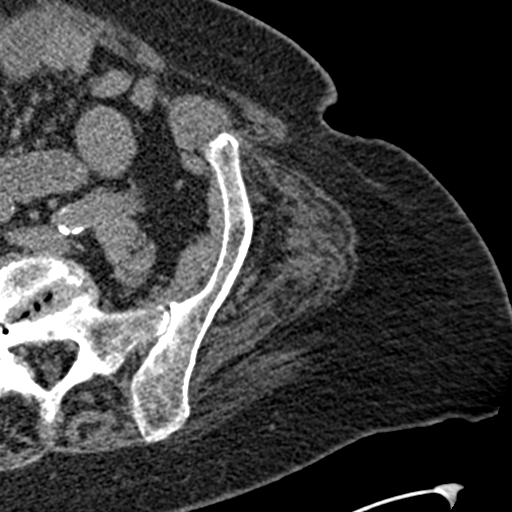
[im 121/130  lung]
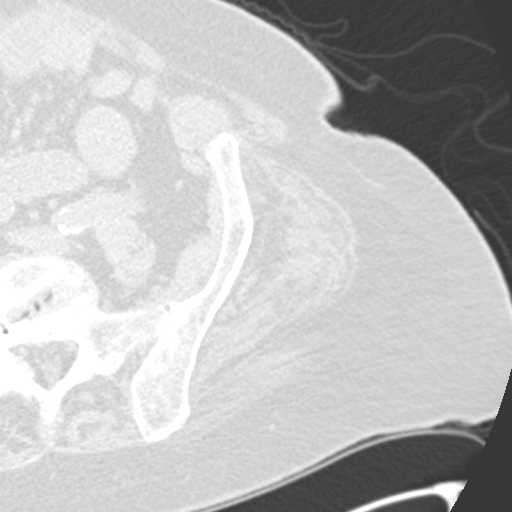
[im 125/130  lung]
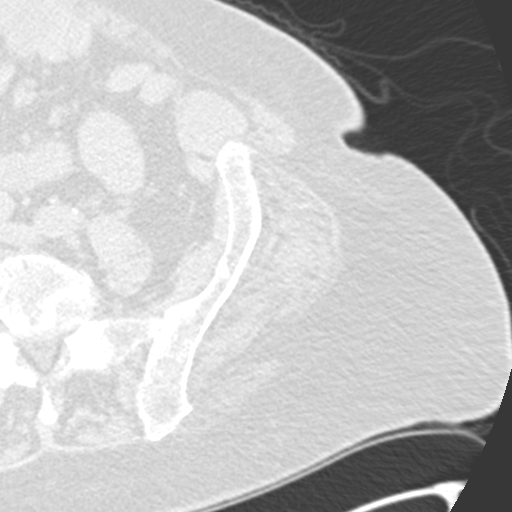

[15 of 32 positions shown; findings below may reference images not displayed]

FINDINGS: Bones/Joint/Cartilage

Abnormal morphology of the left femoral head and acetabulum likely
reflecting congenital hip dysplasia. There is superior subluxation
of the femoral head. There is prominent subchondral sclerosis and
cystic change involving the articulating surfaces of the femoral
head and acetabulum. There is also prominent chronic appearing
erosive change along the anterior margin of the femoral head/neck
junction. No periosteal reaction. Large left hip joint effusion.

No acute fracture or malalignment. Mild degenerative changes of the
left sacroiliac joint. Osteopenia.

Muscles and Tendons

Severe fatty atrophy of the left gluteus minimus muscle. Mild fatty
atrophy of the left gluteus medius muscle.

Soft tissues

Prior prostatectomy. Diverticulum along the left lateral bladder
wall. Mild sigmoid diverticulosis. Atherosclerotic vascular
calcifications.
IMPRESSION: 1. Underlying congenital hip dysplasia with secondary severe
osteoarthritis.
2. Chronic appearing erosive changes along the anterior femoral
head/neck junction. While these may represent prominent synovial
herniation pits, given the presence of a large left hip joint
effusion, chronic changes secondary to an inflammatory or infectious
arthropathy should also be considered. Joint aspiration may be
performed as clinically indicated.

## 2018-05-02 ENCOUNTER — Other Ambulatory Visit: Payer: Medicare Other

## 2018-05-02 ENCOUNTER — Ambulatory Visit: Payer: Medicare Other | Admitting: Urology

## 2018-05-02 DIAGNOSIS — C61 Malignant neoplasm of prostate: Secondary | ICD-10-CM

## 2018-05-03 LAB — PSA: Prostate Specific Ag, Serum: <0.1 ng/mL (ref 0.0–4.0)

## 2018-05-16 ENCOUNTER — Ambulatory Visit (INDEPENDENT_AMBULATORY_CARE_PROVIDER_SITE_OTHER): Payer: Medicare Other | Admitting: Urology

## 2018-05-16 ENCOUNTER — Encounter: Payer: Self-pay | Admitting: Urology

## 2018-05-16 VITALS — BP 151/73 | HR 73 | Ht 69.0 in | Wt 215.0 lb

## 2018-05-16 DIAGNOSIS — C61 Malignant neoplasm of prostate: Secondary | ICD-10-CM

## 2018-05-16 NOTE — Progress Notes (Signed)
05/16/2018 12:53 PM   Joshua Humphrey 20-Oct-1929 [id number]  Referring provider: Kirk Ruths, MD [place] [facility] [place], [place] [address]  Chief Complaint  Patient presents with  . Prostate Cancer    1year   Urologic history: 1.  Prostate cancer  -Status post radical prostatectomy 2001 for prostate cancer in [place].  -Apparently on long-term ADT although no metastatic disease.  -Lupron was discontinued 2017; PSA has remained undetectable   HPI: 85 year old male presents for follow-up of prostate cancer.  He was last seen February 2019 and saw Dr. Junious Silk.  He presently has no complaints.  He has urinary frequency and mild incontinence which is not bothersome.  Denies dysuria, gross hematuria or flank/abdominal/pelvic/scrotal pain.  PSA 04/2018 was undetectable at <0.1  PMH: Past Medical History:  Diagnosis Date  . Anemia   . Asthma   . Cancer [facility])    prostate  . Cancer ([place])    skin   . Diabetes mellitus without complication ([place])   . GERD (gastroesophageal reflux disease)   . Hypertension   . PONV (postoperative nausea and vomiting)   . Respiratory infection 08/23/2017  . Septic olecranon bursitis of left elbow 01/22/2016  . Sleep apnea     Surgical History: Past Surgical History:  Procedure Laterality Date  . CATARACT EXTRACTION, BILATERAL    . ELBOW SURGERY    . EYE SURGERY    . lipoma removal  2000   back   . OLECRANON BURSECTOMY Left 01/21/2016   Procedure: OLECRANON BURSA;  Surgeon: Corky Mull, MD;  Location: [facility] ORS;  Service: Orthopedics;  Laterality: Left;  . PROSTATE SURGERY  2002  . TOE SURGERY    . TONSILLECTOMY    . tumor removed  2000    Home Medications:  Allergies as of 05/16/2018   No Known Allergies     Medication List       Accurate as of May 16, 2018 12:53 PM. Always use your most recent med list.        acetaminophen 650 MG CR tablet Commonly known as:  TYLENOL Take 650-1,300  mg by mouth every 8 (eight) hours as needed (for pain.).   albuterol 108 (90 Base) MCG/ACT inhaler Commonly known as:  PROVENTIL HFA;VENTOLIN HFA 2 puffs q.i.d. p.r.n. short of breath, wheezing, or cough   aspirin EC 81 MG tablet Take 81 mg by mouth daily.   atorvastatin 40 MG tablet Commonly known as:  LIPITOR Take 40 mg by mouth daily.   B-D ULTRAFINE III SHORT PEN 31G X 8 MM Misc Generic drug:  Insulin Pen Needle USE BID UTD   benzonatate 200 MG capsule Commonly known as:  TESSALON Take 200 mg by mouth as needed.   CALTRATE 600+D PO Take 2 tablets by mouth daily.   carvedilol 25 MG tablet Commonly known as:  COREG TAKE 1 TABLET (25 MG) TWICE DAILY   cetirizine 10 MG tablet Commonly known as:  ZYRTEC Take 10 mg by mouth every evening.   Combivent Respimat 20-100 MCG/ACT Aers respimat Generic drug:  Ipratropium-Albuterol INHALE 2 PUFF FOUR TIMES DAILY AS NEEDED FOR SHORTNESS OF BREATH AND/OR WHEEZING.   ferrous fumarate 325 (106 Fe) MG Tabs tablet Commonly known as:  HEMOCYTE - 106 mg FE Take 1 tablet by mouth 2 (two) times daily.   FLAXSEED (LINSEED) PO Take by mouth daily.   fluticasone 50 MCG/ACT nasal spray Commonly known as:  FLONASE Place 1-2 sprays into  both nostrils daily as needed (for allergies.).   furosemide 40 MG tablet Commonly known as:  LASIX TAKE 1 TABLET BY MOUTH TWICE DAILY   gabapentin 300 MG capsule Commonly known as:  Neurontin Take 1-3 capsules (300-900 mg total) by mouth 4 (four) times daily. Follow titration schedule.   Glucosamine-Chondroitin Caps Take 2 capsules by mouth daily.   hydrALAZINE 50 MG tablet Commonly known as:  APRESOLINE Take 50 mg by mouth 3 (three) times daily.   Levemir FlexTouch 100 UNIT/ML Pen Generic drug:  Insulin Detemir Inject 15 Units into the skin every evening.   losartan 100 MG tablet Commonly known as:  COZAAR Take 100 mg by mouth daily.   magnesium oxide 400 MG tablet Commonly known as:   MAG-OX Take 400 mg by mouth daily.   metFORMIN 850 MG tablet Commonly known as:  GLUCOPHAGE Take 850 mg by mouth 2 (two) times daily.   mometasone-formoterol 200-5 MCG/ACT Aero Commonly known as:  DULERA Inhale 2 puffs into the lungs 2 (two) times daily.   montelukast 10 MG tablet Commonly known as:  SINGULAIR Take 10 mg by mouth at bedtime.   NIFEdipine 90 MG 24 hr tablet Commonly known as:  PROCARDIA XL/NIFEDICAL-XL Take 90 mg by mouth daily.   Ocuvite Adult 50+ Caps Take 1 capsule by mouth daily.   omeprazole 20 MG capsule Commonly known as:  PRILOSEC Take 20 mg by mouth daily.   potassium chloride SA 20 MEQ tablet Commonly known as:  K-DUR,KLOR-CON TAKE 1 TABLET(20 MEQ) BY MOUTH TWICE DAILY   pramipexole 0.25 MG tablet Commonly known as:  MIRAPEX Take 0.25 mg by mouth at bedtime as needed (for restless leg syndrome).   sitaGLIPtin 100 MG tablet Commonly known as:  JANUVIA Take 100 mg by mouth daily.   torsemide 20 MG tablet Commonly known as:  DEMADEX Take by mouth.   traMADol 50 MG tablet Commonly known as:  ULTRAM Take 1 tablet (50 mg total) by mouth daily as needed for severe pain.       Allergies: No Known Allergies  Family History: Family History  Problem Relation Age of Onset  . Cancer Mother   . Alcohol abuse Father   . Cancer Brother     Social History:  reports that he has never smoked. He has never used smokeless tobacco. He reports current alcohol use. He reports that he does not use drugs.  ROS: UROLOGY Frequent Urination?: No Hard to postpone urination?: No Burning/pain with urination?: No Get up at night to urinate?: No Leakage of urine?: Yes Urine stream starts and stops?: No Trouble starting stream?: No Do you have to strain to urinate?: No Blood in urine?: No Urinary tract infection?: No Sexually transmitted disease?: No Injury to kidneys or bladder?: No Painful intercourse?: No Weak stream?: No Erection problems?:  No Penile pain?: No  Gastrointestinal Nausea?: No Vomiting?: No Indigestion/heartburn?: No Diarrhea?: No Constipation?: No  Constitutional Fever: No Night sweats?: No Weight loss?: No Fatigue?: No  Skin Skin rash/lesions?: No Itching?: No  Eyes Blurred vision?: No Double vision?: No  Ears/Nose/Throat Sore throat?: No Sinus problems?: No  Hematologic/Lymphatic Swollen glands?: No Easy bruising?: No  Cardiovascular Leg swelling?: No Chest pain?: No  Respiratory Cough?: No Shortness of breath?: No  Endocrine Excessive thirst?: No  Musculoskeletal Back pain?: Yes Joint pain?: Yes  Neurological Headaches?: No Dizziness?: No  Psychologic Depression?: No Anxiety?: No  Physical Exam: BP (!) 151/73   Pulse 73   Ht 5\' 9"  (  1.753 m)   Wt 215 lb (97.5 kg)   BMI 31.75 kg/m   Constitutional:  Alert and oriented, No acute distress. HEENT: [place] AT, moist mucus membranes.  Trachea midline, no masses. Cardiovascular: No clubbing, cyanosis, or edema. Respiratory: Normal respiratory effort, no increased work of breathing. Skin: No rashes, bruises or suspicious lesions. Neurologic: Grossly intact, no focal deficits, moving all 4 extremities. Psychiatric: Normal mood and affect.   Assessment & Plan:   85 year old male with history of prostate cancer status post radical prostatectomy in 2001.  Was previously on long-term ADT which was discontinued in 2017.  PSA remains undetectable.  Continue annual follow-up.  Return in about 1 year (around 05/16/2019) for Recheck, PSA.  Abbie Sons, [place] [address] [place], [place] [address] [phone]

## 2018-07-27 ENCOUNTER — Ambulatory Visit: Payer: Medicare Other | Attending: Nurse Practitioner | Admitting: Nurse Practitioner

## 2018-07-27 ENCOUNTER — Other Ambulatory Visit: Payer: Self-pay

## 2019-01-05 ENCOUNTER — Ambulatory Visit (INDEPENDENT_AMBULATORY_CARE_PROVIDER_SITE_OTHER): Payer: Medicare Other

## 2019-01-05 ENCOUNTER — Other Ambulatory Visit: Payer: Self-pay

## 2019-01-05 DIAGNOSIS — Z23 Encounter for immunization: Secondary | ICD-10-CM

## 2019-02-08 ENCOUNTER — Telehealth: Payer: Self-pay | Admitting: *Deleted

## 2019-02-08 ENCOUNTER — Telehealth: Payer: Self-pay

## 2019-02-08 NOTE — Telephone Encounter (Signed)
  Attempted to call patient and go over pre virtual appointment questions.  Left message.

## 2019-02-09 ENCOUNTER — Ambulatory Visit: Payer: Medicare Other | Attending: Pain Medicine | Admitting: Pain Medicine

## 2019-02-09 ENCOUNTER — Other Ambulatory Visit: Payer: Self-pay

## 2019-02-09 ENCOUNTER — Encounter: Payer: Self-pay | Admitting: Pain Medicine

## 2019-02-09 DIAGNOSIS — G894 Chronic pain syndrome: Secondary | ICD-10-CM

## 2019-02-09 DIAGNOSIS — M5442 Lumbago with sciatica, left side: Secondary | ICD-10-CM | POA: Diagnosis not present

## 2019-02-09 DIAGNOSIS — M47816 Spondylosis without myelopathy or radiculopathy, lumbar region: Secondary | ICD-10-CM

## 2019-02-09 DIAGNOSIS — M25552 Pain in left hip: Secondary | ICD-10-CM | POA: Diagnosis not present

## 2019-02-09 DIAGNOSIS — M5441 Lumbago with sciatica, right side: Secondary | ICD-10-CM

## 2019-02-09 DIAGNOSIS — M79605 Pain in left leg: Secondary | ICD-10-CM

## 2019-02-09 DIAGNOSIS — M79604 Pain in right leg: Secondary | ICD-10-CM | POA: Diagnosis not present

## 2019-02-09 DIAGNOSIS — M792 Neuralgia and neuritis, unspecified: Secondary | ICD-10-CM

## 2019-02-09 DIAGNOSIS — G8929 Other chronic pain: Secondary | ICD-10-CM

## 2019-02-09 MED ORDER — TRAMADOL HCL 50 MG PO TABS: 50.0000 mg | ORAL_TABLET | ORAL | 1 refills | Status: DC

## 2019-02-09 MED ORDER — GABAPENTIN 300 MG PO CAPS: 900.0000 mg | ORAL_CAPSULE | Freq: Three times a day (TID) | ORAL | 5 refills | Status: DC

## 2019-02-09 NOTE — Patient Instructions (Signed)

## 2019-02-09 NOTE — Progress Notes (Signed)
Pain Management Virtual Encounter Note - Virtual Visit via Telephone Telehealth (real-time audio visits between healthcare provider and patient).   Patient's Phone No. & Preferred Pharmacy:  [phone] (home); [phone] (mobile); (Preferred) [phone] [email or web address]  [place] [id number], [place] [address] Phone: [phone] Fax: [place], [facility] [address] [place] [address] [place] [address] Phone: [phone] Fax: [phone]    Pre-screening note:  Our staff contacted Mr. Guglielmo and offered him an "in person", "face-to-face" appointment versus a telephone encounter. He indicated preferring the telephone encounter, at this time.   Reason for Virtual Visit: COVID-19*  Social distancing based on CDC and AMA recommendations.   I contacted Joshua Humphrey on 02/09/2019 via telephone.      I clearly identified myself as Gaspar Cola, MD. I verified that I was speaking with the correct person using two identifiers (Name: Joshua Humphrey, and date of birth: 04-Aug-1929).  Advanced Informed Consent I sought verbal advanced consent from Joshua Humphrey for virtual visit interactions. I informed Joshua Humphrey of possible security and privacy concerns, risks, and limitations associated with providing "not-in-person" medical evaluation and management services. I also informed Joshua Humphrey of the availability of "in-person" appointments. Finally, I informed him that there would be a charge for the virtual visit and that he could be  personally, fully or partially, financially responsible for it. Joshua Humphrey expressed understanding and agreed to proceed.   Historic Elements   Joshua Humphrey is a 85 y.o. year old, male patient evaluated today after his last encounter by our practice on 02/08/2019. Joshua Humphrey  has a past medical  history of Anemia, Asthma, Cancer ([place]), Cancer ([place]), Diabetes mellitus without complication ([place]), GERD (gastroesophageal reflux disease), Hypertension, PONV (postoperative nausea and vomiting), Respiratory infection (08/23/2017), Septic olecranon bursitis of left elbow (01/22/2016), and Sleep apnea. He also  has a past surgical history that includes tumor removed (2000); Prostate surgery (2002); lipoma removal (2000); Tonsillectomy; Cataract extraction, bilateral; Toe Surgery; Olecranon bursectomy (Left, 01/21/2016); Elbow surgery; and Eye surgery. Joshua Humphrey has a current medication list which includes the following prescription(s): acetaminophen, albuterol, aspirin ec, atorvastatin, b-d ultrafine iii short pen, benzonatate, calcium carbonate-vitamin d, carvedilol, cetirizine, ferrous fumarate, flaxseed (linseed), fluticasone, furosemide, glucosamine-chondroitin, hydralazine, ipratropium-albuterol, levemir flextouch, losartan, magnesium oxide, metformin, mometasone-formoterol, montelukast, ocuvite adult 50+, nifedipine, omeprazole, potassium chloride sa, pramipexole, sitagliptin, gabapentin, torsemide, and tramadol. He  reports that he has never smoked. He has never used smokeless tobacco. He reports current alcohol use. He reports that he does not use drugs. Joshua Humphrey has No Known Allergies.   HPI  Today, he is being contacted for worsening of previously known (established) problem.  He indicates that he has been having more of the low back pain, bilaterally with the right side being worse than the left.  In addition, he is having more pain in his right knee and his left hip.  He was wondering if we can bring him in to do some of the injections that we had done in the past which have worked well for him.  Sadly, his wife passed away on 05/15/18 [facility] Valentine's Day).  He refers that he has been dealing with this since and this is the reason why he had not contacted [place].  Pharmacotherapy  Assessment  Analgesic: Tramadol 50mg ,  1 tab PO QD (50 mg/day) MME/day: 50mg /day.   Monitoring: Pharmacotherapy: No side-effects or adverse reactions reported. [place] PMP: PDMP reviewed during this encounter.       Compliance: No problems identified. Effectiveness: Clinically acceptable. Plan: Refer to "POC".  UDS:  Summary  Date Value Ref Range Status  09/08/2016 FINAL  Final    Comment:    ==================================================================== TOXASSURE COMP DRUG ANALYSIS,UR ==================================================================== Test                             Result       Flag       Units Drug Present and Declared for Prescription Verification   Gabapentin                     PRESENT      EXPECTED   Acetaminophen                  PRESENT      EXPECTED Drug Absent but Declared for Prescription Verification   Hydrocodone                    Not Detected UNEXPECTED ng/mg creat   Tramadol                       Not Detected UNEXPECTED   Salicylate                     Not Detected UNEXPECTED    Aspirin, as indicated in the declared medication list, is not    always detected even when used as directed. ==================================================================== Test                      Result    Flag   Units      Ref Range   Creatinine              23               mg/dL      >=20 ==================================================================== Declared Medications:  The flagging and interpretation on this report are based on the  following declared medications.  Unexpected results may arise from  inaccuracies in the declared medications.  **Note: The testing scope of this panel includes these medications:  Gabapentin  Hydrocodone (Norco)  Tramadol (Ultram)  **Note: The testing scope of this panel does not include small to  moderate amounts of these reported medications:  Acetaminophen (Norco)  Acetaminophen (Tylenol)  Aspirin (Aspirin  81)  **Note: The testing scope of this panel does not include following  reported medications:  Albuterol (Combivent)  Atorvastatin (Lipitor)  Calcium Carbonate (Calcium carbonate/Vitamin D)  Carvedilol (Coreg)  Cephalexin (Keflex)  Cetirizine (Zyrtec)  Chondroitin (Glucosamine-Chondroitin)  Ezetimibe (Vytorin)  Fluticasone (Flonase)  Formoterol (Dulera)  Furosemide (Lasix)  Glucosamine (Glucosamine-Chondroitin)  Hydralazine  Ipratropium (Combivent)  Iron  Leuprolide Acetate (Lupron)  Losartan (Cozaar)  Meloxicam (Mobic)  Metformin (Glucophage)  Mometasone (Dulera)  Montelukast (Singulair)  Multivitamin  Nifedipine  Omeprazole  Potassium  Pramipexole (Mirapex)  Simvastatin (Vytorin)  Sitagliptin (Januvia)  Supplement  Vitamin D (Calcium carbonate/Vitamin D) ==================================================================== For clinical consultation, please call [phone]. ====================================================================    Laboratory Chemistry Profile (12 mo)  Renal: No results found for requested labs within last 8760 hours.  Lab Results  Component Value Date   GFRAA 97 09/08/2016   GFRNONAA 84 09/08/2016   Hepatic:  No results found for requested labs within last 8760 hours. Lab Results  Component Value Date   AST 17 09/08/2016   ALT 15 09/08/2016   Other: No results found for requested labs within last 8760 hours. Note: Above Lab results reviewed.  Imaging  CT Cervical Spine Wo Contrast CLINICAL DATA:  Patient fell. 2 inch laceration to left anterior head. Patient denies loss of consciousness.  EXAM: CT HEAD WITHOUT CONTRAST  CT CERVICAL SPINE WITHOUT CONTRAST  TECHNIQUE: Multidetector CT imaging of the head and cervical spine was performed following the standard protocol without intravenous contrast. Multiplanar CT image reconstructions of the cervical spine were also generated.  COMPARISON:  None.  FINDINGS: CT  HEAD FINDINGS  Brain: No evidence of acute infarction, hemorrhage, hydrocephalus, extra-axial collection or mass lesion/mass effect.  There is ventricular and sulcal enlargement reflecting mild diffuse atrophy. Patchy areas of white matter hypoattenuation are noted consistent with mild chronic microvascular ischemic change  Vascular: No hyperdense vessel or unexpected calcification.  Skull: Normal. Negative for fracture or focal lesion.  Sinuses/Orbits: Globes and orbits are unremarkable. The visualized sinuses and mastoid air cells are clear.  Other: Left frontal scalp laceration with small bubbles of air in the soft tissues.  CT CERVICAL SPINE FINDINGS  Alignment: Normal.  Skull base and vertebrae: No acute fracture. No primary bone lesion or focal pathologic process.  Soft tissues and spinal canal: No prevertebral fluid or swelling. No visible canal hematoma.  Disc levels: Moderate to marked loss of disc height from C3-C4 through C7-T1. Mild spondylotic disc bulging with endplate spurring throughout these levels. There are bilateral facet degenerative changes, greatest on the left at C4-C5. No convincing disc herniation.  Upper chest: No mass or adenopathy. No acute findings. Lung apices are clear.  Other: None.  IMPRESSION: HEAD CT  1. No acute intracranial abnormalities.  No skull fracture. 2. Mild atrophy and chronic microvascular ischemic change. 3. Left frontal scalp laceration.  CERVICAL CT  1. No fracture or acute finding.  Electronically Signed   By: Lajean Manes M.D.   On: 01/19/2018 14:56 CT Head Wo Contrast CLINICAL DATA:  Patient fell. 2 inch laceration to left anterior head. Patient denies loss of consciousness.  EXAM: CT HEAD WITHOUT CONTRAST  CT CERVICAL SPINE WITHOUT CONTRAST  TECHNIQUE: Multidetector CT imaging of the head and cervical spine was performed following the standard protocol without intravenous contrast. Multiplanar CT  image reconstructions of the cervical spine were also generated.  COMPARISON:  None.  FINDINGS: CT HEAD FINDINGS  Brain: No evidence of acute infarction, hemorrhage, hydrocephalus, extra-axial collection or mass lesion/mass effect.  There is ventricular and sulcal enlargement reflecting mild diffuse atrophy. Patchy areas of white matter hypoattenuation are noted consistent with mild chronic microvascular ischemic change  Vascular: No hyperdense vessel or unexpected calcification.  Skull: Normal. Negative for fracture or focal lesion.  Sinuses/Orbits: Globes and orbits are unremarkable. The visualized sinuses and mastoid air cells are clear.  Other: Left frontal scalp laceration with small bubbles of air in the soft tissues.  CT CERVICAL SPINE FINDINGS  Alignment: Normal.  Skull base and vertebrae: No acute fracture. No primary bone lesion or focal pathologic process.  Soft tissues and spinal canal: No prevertebral fluid or swelling. No visible canal hematoma.  Disc levels: Moderate to marked loss of disc height from C3-C4 through C7-T1. Mild spondylotic disc bulging with endplate spurring throughout these levels. There are bilateral facet degenerative changes, greatest on the left at C4-C5. No convincing disc  herniation.  Upper chest: No mass or adenopathy. No acute findings. Lung apices are clear.  Other: None.  IMPRESSION: HEAD CT  1. No acute intracranial abnormalities.  No skull fracture. 2. Mild atrophy and chronic microvascular ischemic change. 3. Left frontal scalp laceration.  CERVICAL CT  1. No fracture or acute finding.  Electronically Signed   By: Lajean Manes M.D.   On: 01/19/2018 14:56   Assessment  The primary encounter diagnosis was Chronic pain syndrome. Diagnoses of Chronic low back pain (Primary Area of Pain) (Bilateral) (R>L), Chronic hip pain (Secondary Area of Pain) (Left), Chronic lower extremity pain (Tertiary source of pain)  (Bilateral) (L>R), Neurogenic pain, and Lumbar facet syndrome (Bilateral) (R>L) were also pertinent to this visit.  Plan of Care  Problem-specific:  No problem-specific Assessment & Plan notes found for this encounter.  I have changed Joshua Humphrey's traMADol and gabapentin. I am also having him maintain his ferrous fumarate, montelukast, sitaGLIPtin, pramipexole, omeprazole, Glucosamine-Chondroitin, atorvastatin, fluticasone, Levemir FlexTouch, acetaminophen, aspirin EC, carvedilol, cetirizine, Ipratropium-Albuterol, furosemide, hydrALAZINE, losartan, metFORMIN, mometasone-formoterol, NIFEdipine, Ocuvite Adult 50+, (FLAXSEED, LINSEED, PO), Calcium Carbonate-Vitamin D (CALTRATE 600+D PO), potassium chloride SA, B-D ULTRAFINE III SHORT PEN, magnesium oxide, benzonatate, torsemide, and albuterol.  Pharmacotherapy (Medications Ordered): Meds ordered this encounter  Medications  . traMADol (ULTRAM) 50 MG tablet    Sig: Take 1 tablet (50 mg total) by mouth every other day.    Dispense:  45 tablet    Refill:  1    Chronic Pain: STOP Act (Not applicable) Fill 1 day early if closed on refill date. Do not fill until: 02/09/2019. To last until: 08/08/2019. Avoid benzodiazepines within 8 hours of opioids  . gabapentin (NEURONTIN) 300 MG capsule    Sig: Take 3 capsules (900 mg total) by mouth 3 (three) times daily.    Dispense:  270 capsule    Refill:  5    Fill one day early if pharmacy is closed on scheduled refill date. May substitute for generic if available.   Orders:  Orders Placed This Encounter  Procedures  . L-FCT Blk (Schedule)    Standing Status:   Future    Standing Expiration Date:   03/12/2019    Scheduling Instructions:     Procedure: Lumbar facet block (AKA.: Lumbosacral medial branch nerve block)     Side: Bilateral     Level: L3-4, L4-5, & L5-S1 Facets (L2, L3, L4, L5, & S1 Medial Branch Nerves)     Sedation: Patient's choice.     Timeframe: ASAA    Order Specific Question:    Where will this procedure be performed?    Answer:   [facility]   Follow-up plan:   Return for Procedure (w/ sedation): (B) L-FCT BLK #4.     Interventional treatment options: Planned, scheduled, and/or pending: Palliative bilateral lumbar facet block #4 under fluoroscopic guidance and IV sedation   Under consideration: Diagnostic left femoral + obturator NB Possible left femoral + obturator nerve RFA Diagnostic left L2-3 LESI Diagnostic left L3-4 LESI Diagnosticleft L4-5 LESI Diagnosticleft L2, L3 and L4 TFESI  Diagnostic right L4 and L5 TFESI  Possible bilateral lumbar facet RFA Diagnostic bilateral genicular NB Possible bilateral genicular nerve RFA   Therapeutic/palliative (PRN): Palliative bilateral lumbar facet block#4   Palliativeleft IA hip joint injection #3 Palliative bilateral IA knee injection (steroids) #2  Palliative bilateral IA Hyalgan knee injection #S3N1     Recent Visits No visits were found meeting these conditions.  Showing  recent visits within past 90 days and meeting all other requirements   Today's Visits Date Type Provider Dept  02/09/19 Telemedicine Milinda Pointer, MD [facility]  Showing today's visits and meeting all other requirements   Future Appointments No visits were found meeting these conditions.  Showing future appointments within next 90 days and meeting all other requirements   I discussed the assessment and treatment plan with the patient. The patient was provided an opportunity to ask questions and all were answered. The patient agreed with the plan and demonstrated an understanding of the instructions.  Patient advised to call back or seek an in-person evaluation if the symptoms or condition worsens.  Total duration of non-face-to-face encounter: 15 minutes.  Note by: Gaspar Cola, MD Date: 02/09/2019; Time: 3:45 PM  Note: This dictation was prepared with Dragon dictation. Any  transcriptional errors that may result from this process are unintentional.  Disclaimer:  * Given the special circumstances of the COVID-19 pandemic, the federal government has announced that the Office for Civil Rights (OCR) will exercise its enforcement discretion and will not impose penalties on physicians using telehealth in the event of noncompliance with regulatory requirements under the [place] (HIPAA) in connection with the good faith provision of telehealth during the XX123456 national public health emergency. ([place])

## 2019-02-14 ENCOUNTER — Other Ambulatory Visit: Payer: Self-pay

## 2019-02-14 ENCOUNTER — Ambulatory Visit (HOSPITAL_BASED_OUTPATIENT_CLINIC_OR_DEPARTMENT_OTHER): Payer: Medicare Other | Admitting: Pain Medicine

## 2019-02-14 ENCOUNTER — Ambulatory Visit
Admission: RE | Admit: 2019-02-14 | Discharge: 2019-02-14 | Disposition: A | Discharge status: Home / Self Care | Payer: Medicare Other | Source: Ambulatory Visit | Attending: Pain Medicine | Admitting: Pain Medicine

## 2019-02-14 ENCOUNTER — Encounter: Payer: Self-pay | Admitting: Pain Medicine

## 2019-02-14 VITALS — BP 177/90 | HR 66 | Resp 16 | Ht 69.0 in | Wt 220.0 lb

## 2019-02-14 DIAGNOSIS — M5442 Lumbago with sciatica, left side: Secondary | ICD-10-CM

## 2019-02-14 DIAGNOSIS — M5441 Lumbago with sciatica, right side: Secondary | ICD-10-CM | POA: Diagnosis present

## 2019-02-14 DIAGNOSIS — M47816 Spondylosis without myelopathy or radiculopathy, lumbar region: Secondary | ICD-10-CM

## 2019-02-14 DIAGNOSIS — M5136 Other intervertebral disc degeneration, lumbar region: Secondary | ICD-10-CM | POA: Insufficient documentation

## 2019-02-14 DIAGNOSIS — M47817 Spondylosis without myelopathy or radiculopathy, lumbosacral region: Secondary | ICD-10-CM | POA: Diagnosis present

## 2019-02-14 DIAGNOSIS — G8929 Other chronic pain: Secondary | ICD-10-CM | POA: Insufficient documentation

## 2019-02-14 MED ORDER — LIDOCAINE HCL 2 % IJ SOLN
20.0000 mL | Freq: Once | INTRAMUSCULAR | Status: AC
  Administered 2019-02-14: 400 mg
  Filled 2019-02-14: qty 40

## 2019-02-14 MED ORDER — TRIAMCINOLONE ACETONIDE 40 MG/ML IJ SUSP
80.0000 mg | Freq: Once | INTRAMUSCULAR | Status: AC
  Administered 2019-02-14: 80 mg
  Filled 2019-02-14: qty 2

## 2019-02-14 MED ORDER — ROPIVACAINE HCL 2 MG/ML IJ SOLN
18.0000 mL | Freq: Once | INTRAMUSCULAR | Status: AC
  Administered 2019-02-14: 18 mL via PERINEURAL
  Filled 2019-02-14: qty 20

## 2019-02-14 MED ORDER — LACTATED RINGERS IV SOLN: 1000.0000 mL | Freq: Once | INTRAVENOUS | Status: DC

## 2019-02-14 MED ORDER — MIDAZOLAM HCL 5 MG/5ML IJ SOLN: 1.0000 mg | INTRAMUSCULAR | Status: DC | PRN

## 2019-02-14 MED ORDER — FENTANYL CITRATE (PF) 100 MCG/2ML IJ SOLN: 25.0000 ug | INTRAMUSCULAR | Status: DC | PRN

## 2019-02-14 NOTE — Progress Notes (Signed)
Patient's Name: Joshua Humphrey  MRN: [id number]  Referring Provider: Kirk Ruths, MD  DOB: 05/29/1929  PCP: Kirk Ruths, MD  DOS: 02/14/2019  Note by: Gaspar Cola, MD  Service setting: Ambulatory outpatient  Specialty: Interventional Pain Management  Patient type: Established  Location: [facility] (AMB) Pain Management Facility  Visit type: Interventional Procedure   Primary Reason for Visit: Interventional Pain Management Treatment. CC: Back Pain (lower)  Procedure:          Anesthesia, Analgesia, Anxiolysis:  Type: Lumbar Facet, Medial Branch Block(s) #4  Primary Purpose: Diagnostic Region: Posterolateral Lumbosacral Spine Level: L2, L3, L4, L5, & S1 Medial Branch Level(s). Injecting these levels blocks the L3-4, L4-5, and L5-S1 lumbar facet joints. Laterality: Bilateral  Type: Moderate (Conscious) Sedation combined with Local Anesthesia Indication(s): Analgesia and Anxiety Route: Intravenous (IV) IV Access: Secured Sedation: Meaningful verbal contact was maintained at all times during the procedure  Local Anesthetic: Lidocaine 1-2%  Position: Prone   Indications: 1. Lumbar facet syndrome (Bilateral) (R>L)   2. Spondylosis without myelopathy or radiculopathy, lumbosacral region   3. Osteoarthritis of lumbar spine   4. Lumbar spondylosis   5. DDD (degenerative disc disease), lumbar   6. Chronic low back pain (Primary Area of Pain) (Bilateral) (R>L)    Pain Score: Pre-procedure: 4 /10 Post-procedure: 4 /10   Pre-op Assessment:  Joshua Humphrey is a 85 y.o. (year old), male patient, seen today for interventional treatment. He  has a past surgical history that includes tumor removed (2000); Prostate surgery (2002); lipoma removal (2000); Tonsillectomy; Cataract extraction, bilateral; Toe Surgery; Olecranon bursectomy (Left, 01/21/2016); Elbow surgery; and Eye surgery. Joshua Humphrey has a current medication list which includes the following prescription(s): acetaminophen,  albuterol, aspirin ec, atorvastatin, b-d ultrafine iii short pen, benzonatate, calcium carbonate-vitamin d, carvedilol, cetirizine, ferrous fumarate, flaxseed (linseed), fluticasone, furosemide, gabapentin, glucosamine-chondroitin, hydralazine, ipratropium-albuterol, levemir flextouch, losartan, magnesium oxide, metformin, mometasone-formoterol, montelukast, ocuvite adult 50+, nifedipine, omeprazole, potassium chloride sa, pramipexole, sitagliptin, tramadol, and torsemide, and the following Facility-Administered Medications: fentanyl, lactated ringers, and midazolam. His primarily concern today is the Back Pain (lower)  Initial Vital Signs:  Pulse/HCG Rate: 70ECG Heart Rate: 68 Temp:   Resp: 16 BP: (!) 186/100(havent had BP med today) SpO2: 96 %  BMI: Estimated body mass index is 32.49 kg/m as calculated from the following:   Height as of this encounter: 5\' 9"  (1.753 m).   Weight as of this encounter: 220 lb (99.8 kg).  Risk Assessment: Allergies: Reviewed. He has No Known Allergies.  Allergy Precautions: None required Coagulopathies: Reviewed. None identified.  Blood-thinner therapy: None at this time Active Infection(s): Reviewed. None identified. Joshua Humphrey is afebrile  Site Confirmation: Joshua Humphrey was asked to confirm the procedure and laterality before marking the site Procedure checklist: Completed Consent: Before the procedure and under the influence of no sedative(s), amnesic(s), or anxiolytics, the patient was informed of the treatment options, risks and possible complications. To fulfill our ethical and legal obligations, as recommended by the American Medical Association's Code of Ethics, I have informed the patient of my clinical impression; the nature and purpose of the treatment or procedure; the risks, benefits, and possible complications of the intervention; the alternatives, including doing nothing; the risk(s) and benefit(s) of the alternative treatment(s) or procedure(s); and  the risk(s) and benefit(s) of doing nothing. The patient was provided information about the general risks and possible complications associated with the procedure. These may include, but are not limited to: failure to achieve  desired goals, infection, bleeding, organ or nerve damage, allergic reactions, paralysis, and death. In addition, the patient was informed of those risks and complications associated to Spine-related procedures, such as failure to decrease pain; infection (i.e.: Meningitis, epidural or intraspinal abscess); bleeding (i.e.: epidural hematoma, subarachnoid hemorrhage, or any other type of intraspinal or peri-dural bleeding); organ or nerve damage (i.e.: Any type of peripheral nerve, nerve root, or spinal cord injury) with subsequent damage to sensory, motor, and/or autonomic systems, resulting in permanent pain, numbness, and/or weakness of one or several areas of the body; allergic reactions; (i.e.: anaphylactic reaction); and/or death. Furthermore, the patient was informed of those risks and complications associated with the medications. These include, but are not limited to: allergic reactions (i.e.: anaphylactic or anaphylactoid reaction(s)); adrenal axis suppression; blood sugar elevation that in diabetics may result in ketoacidosis or comma; water retention that in patients with history of congestive heart failure may result in shortness of breath, pulmonary edema, and decompensation with resultant heart failure; weight gain; swelling or edema; medication-induced neural toxicity; particulate matter embolism and blood vessel occlusion with resultant organ, and/or nervous system infarction; and/or aseptic necrosis of one or more joints. Finally, the patient was informed that Medicine is not an exact science; therefore, there is also the possibility of unforeseen or unpredictable risks and/or possible complications that may result in a catastrophic outcome. The patient indicated having  understood very clearly. We have given the patient no guarantees and we have made no promises. Enough time was given to the patient to ask questions, all of which were answered to the patient's satisfaction. Joshua Humphrey has indicated that he wanted to continue with the procedure. Attestation: I, the ordering provider, attest that I have discussed with the patient the benefits, risks, side-effects, alternatives, likelihood of achieving goals, and potential problems during recovery for the procedure that I have provided informed consent. Date   Time: 02/14/2019  9:36 AM  Pre-Procedure Preparation:  Monitoring: As per clinic protocol. Respiration, ETCO2, SpO2, BP, heart rate and rhythm monitor placed and checked for adequate function Safety Precautions: Patient was assessed for positional comfort and pressure points before starting the procedure. Time-out: I initiated and conducted the "Time-out" before starting the procedure, as per protocol. The patient was asked to participate by confirming the accuracy of the "Time Out" information. Verification of the correct person, site, and procedure were performed and confirmed by me, the nursing staff, and the patient. "Time-out" conducted as per Joint Commission's Universal Protocol (UP.01.01.01). Time: 1013  Description of Procedure:          Laterality: Bilateral. The procedure was performed in identical fashion on both sides. Levels:  L2, L3, L4, L5, & S1 Medial Branch Level(s) Area Prepped: Posterior Lumbosacral Region Prepping solution: DuraPrep (Iodine Povacrylex [0.7% available iodine] and Isopropyl Alcohol, 74% w/w) Safety Precautions: Aspiration looking for blood return was conducted prior to all injections. At no point did we inject any substances, as a needle was being advanced. Before injecting, the patient was told to immediately notify me if he was experiencing any new onset of "ringing in the ears, or metallic taste in the mouth". No attempts were  made at seeking any paresthesias. Safe injection practices and needle disposal techniques used. Medications properly checked for expiration dates. SDV (single dose vial) medications used. After the completion of the procedure, all disposable equipment used was discarded in the proper designated medical waste containers. Local Anesthesia: Protocol guidelines were followed. The patient was positioned over the fluoroscopy table.  The area was prepped in the usual manner. The time-out was completed. The target area was identified using fluoroscopy. A 12-in long, straight, sterile hemostat was used with fluoroscopic guidance to locate the targets for each level blocked. Once located, the skin was marked with an approved surgical skin marker. Once all sites were marked, the skin (epidermis, dermis, and hypodermis), as well as deeper tissues (fat, connective tissue and muscle) were infiltrated with a small amount of a short-acting local anesthetic, loaded on a 10cc syringe with a 25G, 1.5-in  Needle. An appropriate amount of time was allowed for local anesthetics to take effect before proceeding to the next step. Local Anesthetic: Lidocaine 2.0% The unused portion of the local anesthetic was discarded in the proper designated containers. Technical explanation of process:  L2 Medial Branch Nerve Block (MBB): The target area for the L2 medial branch is at the junction of the postero-lateral aspect of the superior articular process and the superior, posterior, and medial edge of the transverse process of L3. Under fluoroscopic guidance, a Quincke needle was inserted until contact was made with os over the superior postero-lateral aspect of the pedicular shadow (target area). After negative aspiration for blood, 0.5 mL of the nerve block solution was injected without difficulty or complication. The needle was removed intact. L3 Medial Branch Nerve Block (MBB): The target area for the L3 medial branch is at the junction of  the postero-lateral aspect of the superior articular process and the superior, posterior, and medial edge of the transverse process of L4. Under fluoroscopic guidance, a Quincke needle was inserted until contact was made with os over the superior postero-lateral aspect of the pedicular shadow (target area). After negative aspiration for blood, 0.5 mL of the nerve block solution was injected without difficulty or complication. The needle was removed intact. L4 Medial Branch Nerve Block (MBB): The target area for the L4 medial branch is at the junction of the postero-lateral aspect of the superior articular process and the superior, posterior, and medial edge of the transverse process of L5. Under fluoroscopic guidance, a Quincke needle was inserted until contact was made with os over the superior postero-lateral aspect of the pedicular shadow (target area). After negative aspiration for blood, 0.5 mL of the nerve block solution was injected without difficulty or complication. The needle was removed intact. L5 Medial Branch Nerve Block (MBB): The target area for the L5 medial branch is at the junction of the postero-lateral aspect of the superior articular process and the superior, posterior, and medial edge of the sacral ala. Under fluoroscopic guidance, a Quincke needle was inserted until contact was made with os over the superior postero-lateral aspect of the pedicular shadow (target area). After negative aspiration for blood, 0.5 mL of the nerve block solution was injected without difficulty or complication. The needle was removed intact. S1 Medial Branch Nerve Block (MBB): The target area for the S1 medial branch is at the posterior and inferior 6 o'clock position of the L5-S1 facet joint. Under fluoroscopic guidance, the Quincke needle inserted for the L5 MBB was redirected until contact was made with os over the inferior and postero aspect of the sacrum, at the 6 o' clock position under the L5-S1 facet joint  (Target area). After negative aspiration for blood, 0.5 mL of the nerve block solution was injected without difficulty or complication. The needle was removed intact.  Nerve block solution: 0.2% PF-Ropivacaine + Triamcinolone (40 mg/mL) diluted to a final concentration of 4 mg of Triamcinolone/mL of  Ropivacaine The unused portion of the solution was discarded in the proper designated containers. Procedural Needles: 22-gauge, 3.5-inch, Quincke needles used for all levels.  Once the entire procedure was completed, the treated area was cleaned, making sure to leave some of the prepping solution back to take advantage of its long term bactericidal properties.   Illustration of the posterior view of the lumbar spine and the posterior neural structures. Laminae of L2 through S1 are labeled. DPRL5, dorsal primary ramus of L5; DPRS1, dorsal primary ramus of S1; DPR3, dorsal primary ramus of L3; FJ, facet (zygapophyseal) joint L3-L4; I, inferior articular process of L4; LB1, lateral branch of dorsal primary ramus of L1; IAB, inferior articular branches from L3 medial branch (supplies L4-L5 facet joint); IBP, intermediate branch plexus; MB3, medial branch of dorsal primary ramus of L3; NR3, third lumbar nerve root; S, superior articular process of L5; SAB, superior articular branches from L4 (supplies L4-5 facet joint also); TP3, transverse process of L3.  Vitals:   02/14/19 0937 02/14/19 1013 02/14/19 1018 02/14/19 1023  BP: (!) 184/93 (!) 161/88 (!) 161/88 (!) 177/90  Pulse: 70 66    Resp:  18 18 16   SpO2: 94% 95% 93% 95%  Weight:      Height:         Start Time: 1013 hrs. End Time: 1023 hrs.  Imaging Guidance (Spinal):          Type of Imaging Technique: Fluoroscopy Guidance (Spinal) Indication(s): Assistance in needle guidance and placement for procedures requiring needle placement in or near specific anatomical locations not easily accessible without such assistance. Exposure Time: Please see  nurses notes. Contrast: None used. Fluoroscopic Guidance: I was personally present during the use of fluoroscopy. "Tunnel Vision Technique" used to obtain the best possible view of the target area. Parallax error corrected before commencing the procedure. "Direction-depth-direction" technique used to introduce the needle under continuous pulsed fluoroscopy. Once target was reached, antero-posterior, oblique, and lateral fluoroscopic projection used confirm needle placement in all planes. Images permanently stored in EMR. Interpretation: No contrast injected. I personally interpreted the imaging intraoperatively. Adequate needle placement confirmed in multiple planes. Permanent images saved into the patient's record.  Antibiotic Prophylaxis:   Anti-infectives (From admission, onward)   None     Indication(s): None identified  Post-operative Assessment:  Post-procedure Vital Signs:  Pulse/HCG Rate: 6668 Temp:   Resp: 16 BP: (!) 177/90 SpO2: 95 %  EBL: None  Complications: No immediate post-treatment complications observed by team, or reported by patient.  Note: The patient tolerated the entire procedure well. A repeat set of vitals were taken after the procedure and the patient was kept under observation following institutional policy, for this type of procedure. Post-procedural neurological assessment was performed, showing return to baseline, prior to discharge. The patient was provided with post-procedure discharge instructions, including a section on how to identify potential problems. Should any problems arise concerning this procedure, the patient was given instructions to immediately contact us, at any time, without hesitation. In any case, we plan to contact the patient by telephone for a follow-up status report regarding this interventional procedure.  Comments:  No additional relevant information.  Plan of Care  Orders:  Orders Placed This Encounter  Procedures   L-FCT Blk  (Today)    Scheduling Instructions:     Procedure: Lumbar facet block (AKA.: Lumbosacral medial branch nerve block)     Side: Bilateral     Level: L3-4, L4-5, & L5-S1 Facets (L2, L3, L4,  L5, & S1 Medial Branch Nerves)     Sedation: Patient's choice.     Timeframe: Today    Order Specific Question:   Where will this procedure be performed?    Answer:   [facility]   Fluoro (C-Arm) (<60 min) (No Report)    Intraoperative interpretation by procedural physician at [facility].    Standing Status:   Standing    Number of Occurrences:   1    Order Specific Question:   Reason for exam:    Answer:   Assistance in needle guidance and placement for procedures requiring needle placement in or near specific anatomical locations not easily accessible without such assistance.   Consent: L-FCT BLK    Nursing Order: Transcribe to consent form and obtain patient signature. Note: Always confirm laterality of pain with Joshua Humphrey, before procedure. Procedure: Lumbar Facet Block  under fluoroscopic guidance Indication/Reason: Low Back Pain, with our without leg pain, due to Facet Joint Arthralgia (Joint Pain) known as Lumbar Facet Syndrome, secondary to Lumbar, and/or Lumbosacral Spondylosis (Arthritis of the Spine), without myelopathy or radiculopathy (Nerve Damage). Provider Attestation: I, Olcott Dossie Arbour, MD, (Pain Management Specialist), the physician/practitioner, attest that I have discussed with the patient the benefits, risks, side effects, alternatives, likelihood of achieving goals and potential problems during recovery for the procedure that I have provided informed consent.   Block Tray    Equipment required: Single use, disposable, "Block Tray"    Standing Status:   Standing    Number of Occurrences:   1    Order Specific Question:   Specify    Answer:   Block Tray   Chronic Opioid Analgesic:  Tramadol 50mg , 1 tab PO QD (50 mg/day) MME/day: 50mg /day.    Medications ordered for procedure: Meds ordered this encounter  Medications   lidocaine (XYLOCAINE) 2 % (with pres) injection 400 mg   lactated ringers infusion 1,000 mL   midazolam (VERSED) 5 MG/5ML injection 1-2 mg    Make sure Flumazenil is available in the pyxis when using this medication. If oversedation occurs, administer 0.2 mg IV over 15 sec. If after 45 sec no response, administer 0.2 mg again over 1 min; may repeat at 1 min intervals; not to exceed 4 doses (1 mg)   fentaNYL (SUBLIMAZE) injection 25-50 mcg    Make sure Narcan is available in the pyxis when using this medication. In the event of respiratory depression (RR< 8/min): Titrate NARCAN (naloxone) in increments of 0.1 to 0.2 mg IV at 2-3 minute intervals, until desired degree of reversal.   ropivacaine (PF) 2 mg/mL (0.2%) (NAROPIN) injection 18 mL   triamcinolone acetonide (KENALOG-40) injection 80 mg   Medications administered: We administered lidocaine, ropivacaine (PF) 2 mg/mL (0.2%), and triamcinolone acetonide.  See the medical record for exact dosing, route, and time of administration.  Follow-up plan:   Return in about 2 weeks (around 02/28/2019) for (VV), (PP).       Interventional treatment options: Planned, scheduled, and/or pending: Palliative bilateral lumbar facet block #4 under fluoroscopic guidance and IV sedation   Under consideration: Diagnostic left femoral + obturator NB Possible left femoral + obturator nerve RFA Diagnostic left L2-3 LESI Diagnostic left L3-4 LESI Diagnosticleft L4-5 LESI Diagnosticleft L2, L3 and L4 TFESI  Diagnostic right L4 and L5 TFESI  Possible bilateral lumbar facet RFA Diagnostic bilateral genicular NB Possible bilateral genicular nerve RFA   Therapeutic/palliative (PRN): Palliative bilateral lumbar facet block#4   Palliativeleft IA hip joint  injection #3 Palliative bilateral IA knee injection (steroids) #2  Palliative bilateral IA Hyalgan  knee injection #[id number]      Recent Visits Date Type Provider Dept  02/09/19 Telemedicine Milinda Pointer, MD [facility]  Showing recent visits within past 90 days and meeting all other requirements   Today's Visits Date Type Provider Dept  02/14/19 Procedure visit Milinda Pointer, MD [facility]  Showing today's visits and meeting all other requirements   Future Appointments Date Type Provider Dept  02/28/19 Appointment Milinda Pointer, MD [facility]  Showing future appointments within next 90 days and meeting all other requirements   Disposition: Discharge home  Discharge Date & Time: 02/14/2019; 1040 hrs.   Primary Care Physician: Kirk Ruths, MD Location: [facility] Note by: Gaspar Cola, MD Date: 02/14/2019; Time: 12:00 PM  Disclaimer:  Medicine is not an Chief Strategy Officer. The only guarantee in medicine is that nothing is guaranteed. It is important to note that the decision to proceed with this intervention was based on the information collected from the patient. The Data and conclusions were drawn from the patient's questionnaire, the interview, and the physical examination. Because the information was provided in large part by the patient, it cannot be guaranteed that it has not been purposely or unconsciously manipulated. Every effort has been made to obtain as much relevant data as possible for this evaluation. It is important to note that the conclusions that lead to this procedure are derived in large part from the available data. Always take into account that the treatment will also be dependent on availability of resources and existing treatment guidelines, considered by other Pain Management Practitioners as being common knowledge and practice, at the time of the intervention. For Medico-Legal purposes, it is also important to point out that variation in procedural techniques and pharmacological  choices are the acceptable norm. The indications, contraindications, technique, and results of the above procedure should only be interpreted and judged by a Board-Certified Interventional Pain Specialist with extensive familiarity and expertise in the same exact procedure and technique.

## 2019-02-14 NOTE — Patient Instructions (Addendum)
____________________________________________________________________________________________  Post-Procedure Discharge Instructions  Instructions:  Apply ice:   Purpose: This will minimize any swelling and discomfort after procedure.   When: Day of procedure, as soon as you get home.  How: Fill a plastic sandwich bag with crushed ice. Cover it with a small towel and apply to injection site.  How long: (15 min on, 15 min off) Apply for 15 minutes then remove x 15 minutes.  Repeat sequence on day of procedure, until you go to bed.  Apply heat:   Purpose: To treat any soreness and discomfort from the procedure.  When: Starting the next day after the procedure.  How: Apply heat to procedure site starting the day following the procedure.  How long: May continue to repeat daily, until discomfort goes away.  Food intake: Start with clear liquids (like water) and advance to regular food, as tolerated.   Physical activities: Keep activities to a minimum for the first 8 hours after the procedure. After that, then as tolerated.  Driving: If you have received any sedation, be responsible and do not drive. You are not allowed to drive for 24 hours after having sedation.  Blood thinner: (Applies only to those taking blood thinners) You may restart your blood thinner 6 hours after your procedure.  Insulin: (Applies only to Diabetic patients taking insulin) As soon as you can eat, you may resume your normal dosing schedule.  Infection prevention: Keep procedure site clean and dry. Shower daily and clean area with soap and water.  Post-procedure Pain Diary: Extremely important that this be done correctly and accurately. Recorded information will be used to determine the next step in treatment. For the purpose of accuracy, follow these rules:  Evaluate only the area treated. Do not report or include pain from an untreated area. For the purpose of this evaluation, ignore all other areas of pain,  except for the treated area.  After your procedure, avoid taking a long nap and attempting to complete the pain diary after you wake up. Instead, set your alarm clock to go off every hour, on the hour, for the initial 8 hours after the procedure. Document the duration of the numbing medicine, and the relief you are getting from it.  Do not go to sleep and attempt to complete it later. It will not be accurate. If you received sedation, it is likely that you were given a medication that may cause amnesia. Because of this, completing the diary at a later time may cause the information to be inaccurate. This information is needed to plan your care.  Follow-up appointment: Keep your post-procedure follow-up evaluation appointment after the procedure (usually 2 weeks for most procedures, 6 weeks for radiofrequencies). DO NOT FORGET to bring you pain diary with you.   Expect: (What should I expect to see with my procedure?)  From numbing medicine (AKA: Local Anesthetics): Numbness or decrease in pain. You may also experience some weakness, which if present, could last for the duration of the local anesthetic.  Onset: Full effect within 15 minutes of injected.  Duration: It will depend on the type of local anesthetic used. On the average, 1 to 8 hours.   From steroids (Applies only if steroids were used): Decrease in swelling or inflammation. Once inflammation is improved, relief of the pain will follow.  Onset of benefits: Depends on the amount of swelling present. The more swelling, the longer it will take for the benefits to be seen. In some cases, up to 10 days.    Duration: Steroids will stay in the system x 2 weeks. Duration of benefits will depend on multiple posibilities including persistent irritating factors.  Side-effects: If present, they may typically last 2 weeks (the duration of the steroids).  Frequent: Cramps (if they occur, drink Gatorade and take over-the-counter Magnesium 450-500 mg  once to twice a day); water retention with temporary weight gain; increases in blood sugar; decreased immune system response; increased appetite.  Occasional: Facial flushing (red, warm cheeks); mood swings; menstrual changes.  Uncommon: Long-term decrease or suppression of natural hormones; bone thinning. (These are more common with higher doses or more frequent use. This is why we prefer that our patients avoid having any injection therapies in other practices.)   Very Rare: Severe mood changes; psychosis; aseptic necrosis.  From procedure: Some discomfort is to be expected once the numbing medicine wears off. This should be minimal if ice and heat are applied as instructed.  Call if: (When should I call?)  You experience numbness and weakness that gets worse with time, as opposed to wearing off.  New onset bowel or bladder incontinence. (Applies only to procedures done in the spine)  Emergency Numbers:  Durning business hours (Monday - Thursday, 8:00 AM - 4:00 PM) (Friday, 9:00 AM - 12:00 Noon): [phone]  After hours: [phone]  NOTE: If you are having a problem and are unable connect with, or to talk to a provider, then go to your nearest urgent care or emergency department. If the problem is serious and urgent, please call 911. ____________________________________________________________________________________________   Pain Management Discharge Instructions  General Discharge Instructions :  If you need to reach your doctor call: Monday-Friday 8:00 am - 4:00 pm at [phone] or toll free [phone].  After clinic hours [phone] to have operator reach doctor.  Bring all of your medication bottles to all your appointments in the pain clinic.  To cancel or reschedule your appointment with Pain Management please remember to call 24 hours in advance to avoid a fee.  Refer to the educational materials which you have been given on: General Risks, I had my  Procedure. Discharge Instructions, Post Sedation.  Post Procedure Instructions:  The drugs you were given will stay in your system until tomorrow, so for the next 24 hours you should not drive, make any legal decisions or drink any alcoholic beverages.  You may eat anything you prefer, but it is better to start with liquids then soups and crackers, and gradually work up to solid foods.  Please notify your doctor immediately if you have any unusual bleeding, trouble breathing or pain that is not related to your normal pain.  Depending on the type of procedure that was done, some parts of your body may feel week and/or numb.  This usually clears up by tonight or the next day.  Walk with the use of an assistive device or accompanied by an adult for the 24 hours.  You may use ice on the affected area for the first 24 hours.  Put ice in a Ziploc bag and cover with a towel and place against area 15 minutes on 15 minutes off.  You may switch to heat after 24 hours.Facet Blocks Patient Information  Description: The facets are joints in the spine between the vertebrae.  Like any joints in the body, facets can become irritated and painful.  Arthritis can also effect the facets.  By injecting steroids and local anesthetic in and around these joints, we can temporarily block the nerve   supply to them.  Steroids act directly on irritated nerves and tissues to reduce selling and inflammation which often leads to decreased pain.  Facet blocks may be done anywhere along the spine from the neck to the low back depending upon the location of your pain.   After numbing the skin with local anesthetic (like Novocaine), a small needle is passed onto the facet joints under x-ray guidance.  You may experience a sensation of pressure while this is being done.  The entire block usually lasts about 15-25 minutes.   Conditions which may be treated by facet blocks:   Low back/buttock pain  Neck/shoulder pain  Certain  types of headaches  Preparation for the injection:  1. Do not eat any solid food or dairy products within 8 hours of your appointment. 2. You may drink clear liquid up to 3 hours before appointment.  Clear liquids include water, black coffee, juice or soda.  No milk or cream please. 3. You may take your regular medication, including pain medications, with a sip of water before your appointment.  Diabetics should hold regular insulin (if taken separately) and take 1/2 normal NPH dose the morning of the procedure.  Carry some sugar containing items with you to your appointment. 4. A driver must accompany you and be prepared to drive you home after your procedure. 5. Bring all your current medications with you. 6. An IV may be inserted and sedation may be given at the discretion of the physician. 7. A blood pressure cuff, EKG and other monitors will often be applied during the procedure.  Some patients may need to have extra oxygen administered for a short period. 8. You will be asked to provide medical information, including your allergies and medications, prior to the procedure.  We must know immediately if you are taking blood thinners (like Coumadin/Warfarin) or if you are allergic to IV iodine contrast (dye).  We must know if you could possible be pregnant.  Possible side-effects:   Bleeding from needle site  Infection (rare, may require surgery)  Nerve injury (rare)  Numbness & tingling (temporary)  Difficulty urinating (rare, temporary)  Spinal headache (a headache worse with upright posture)  Light-headedness (temporary)  Pain at injection site (serveral days)  Decreased blood pressure (rare, temporary)  Weakness in arm/leg (temporary)  Pressure sensation in back/neck (temporary)   Call if you experience:   Fever/chills associated with headache or increased back/neck pain  Headache worsened by an upright position  New onset, weakness or numbness of an extremity  below the injection site  Hives or difficulty breathing (go to the emergency room)  Inflammation or drainage at the injection site(s)  Severe back/neck pain greater than usual  New symptoms which are concerning to you  Please note:  Although the local anesthetic injected can often make your back or neck feel good for several hours after the injection, the pain will likely return. It takes 3-7 days for steroids to work.  You may not notice any pain relief for at least one week.  If effective, we will often do a series of 2-3 injections spaced 3-6 weeks apart to maximally decrease your pain.  After the initial series, you may be a candidate for a more permanent nerve block of the facets.  If you have any questions, please call [phone] [facility] 

## 2019-02-14 NOTE — Progress Notes (Signed)
Safety precautions to be maintained throughout the outpatient stay will include: orient to surroundings, keep bed in low position, maintain call bell within reach at all times, provide assistance with transfer out of bed and ambulation.  

## 2019-02-15 ENCOUNTER — Telehealth: Payer: Self-pay

## 2019-02-15 NOTE — Telephone Encounter (Signed)
Denies any needs at this time. Instructed to call if needed. 

## 2019-02-27 ENCOUNTER — Telehealth: Payer: Self-pay

## 2019-02-27 ENCOUNTER — Encounter: Payer: Self-pay | Admitting: Pain Medicine

## 2019-02-27 NOTE — Telephone Encounter (Signed)
Attempted to call patient and discuss virtual visit appointment questions.  Left message for patient to call us back.

## 2019-02-27 NOTE — Progress Notes (Signed)
Virtual Encounter - Pain Management PROVIDER NOTE: Information contained herein reflects review and annotations entered in association with encounter. Patient information is provided elsewhere in the medical record. Interpretation of information and data should be left to medically trained personnel. Document created using STT technology, any transcriptional errors that may result from process are unintentional.  Contact & Pharmacy Preferred: [phone] Home: [phone] (home) Mobile: [phone] (mobile) E-mail: [email or web address]  [place] [id number], [place] [address] Phone: [phone] Fax: [place], [place] [facility] [address] [place] [address] [place] [address] Phone: [phone] Fax: [phone]   Pre-screening  Mr. Matto offered "in-person" vs "virtual" encounter. He indicated preferring virtual for this encounter.   Reason COVID-19*  Social distancing based on CDC and AMA recommendations.   I contacted Thomos Lemons on 02/28/2019 via telephone.      I clearly identified myself as Gaspar Cola, MD. I verified that I was speaking with the correct person using two identifiers (Name: JOSHAUN NANNINI, and date of birth: 11/10/1929).  Consent I sought verbal advanced consent from Thomos Lemons for virtual visit interactions. I informed Mr. Killmer of possible security and privacy concerns, risks, and limitations associated with providing "not-in-person" medical evaluation and management services. I also informed Mr. Trick of the availability of "in-person" appointments. Finally, I informed him that there would be a charge for the virtual visit and that he could be  personally, fully or partially, financially responsible for it. Mr. Liscio expressed understanding and agreed to proceed.   Historic Elements   Mr. IKER HYLAND is a 85 y.o. year old, male patient evaluated today after his last encounter by our practice on 02/27/2019. Mr. Ohanlon  has a past medical history of Anemia, Asthma, Cancer ([place]), Cancer ([place]), Diabetes mellitus without complication ([place]), GERD (gastroesophageal reflux disease), Hypertension, PONV (postoperative nausea and vomiting), Respiratory infection (08/23/2017), Septic olecranon bursitis of left elbow (01/22/2016), and Sleep apnea. He also  has a past surgical history that includes tumor removed (2000); Prostate surgery (2002); lipoma removal (2000); Tonsillectomy; Cataract extraction, bilateral; Toe Surgery; Olecranon bursectomy (Left, 01/21/2016); Elbow surgery; and Eye surgery. Mr. Wendland has a current medication list which includes the following prescription(s): acetaminophen, albuterol, aspirin ec, atorvastatin, b-d ultrafine iii short pen, benzonatate, calcium carbonate-vitamin d, carvedilol, cetirizine, ferrous fumarate, flaxseed (linseed), fluticasone, furosemide, gabapentin, glucosamine-chondroitin, hydralazine, ipratropium-albuterol, levemir flextouch, losartan, magnesium oxide, metformin, mometasone-formoterol, montelukast, ocuvite adult 50+, nifedipine, omeprazole, potassium chloride sa, pramipexole, sitagliptin, tramadol, and torsemide. He  reports that he has never smoked. He has never used smokeless tobacco. He reports current alcohol use. He reports that he does not use drugs. Mr. Cord has No Known Allergies.   HPI  Today, he is being contacted for a post-procedure assessment.  The patient refers that he is doing great after the bilateral lumbar facet block with 100% relief of the pain in the lower back.  Today, since he had me on the phone he decided to ask me about his right knee pain.  He indicates that he has been seen by the [facility] orthopedic department (Dr. Rudene Christians and Rachelle Hora, PA-C).  According to the patient, they did a series of 3 injections using Orthovisc, the  last of which was done on 04/20/2018 and he refers that it was done by a student that was brought  in by Rachelle Hora, PA-C.  Apparently he had some difficulty getting into the knee and the patient refers that since, the knee has not felt the same.  In fact, he indicates that there is a small black dot where the injection was done.  He indicates that the pain is primarily in the area of the right knee, and the medial aspect.  I looked for x-rays or MRIs of the knee and the only thing that I could find was an x-ray done at a [place] facility on 11/19/2015 that indicated that the patient has DJD of the knee with 50% narrowing of the medial compartment.  With bone-on-bone with some erosion into the tibial plateau.  In any case, his condition has not gotten any better and in fact it seems to be getting worse.  Because of this today I will be ordering a follow-up x-ray of his right knee.  I will also bring him in for evaluation and possible intra-articular injection.  Post-Procedure Evaluation  Procedure: Diagnostic bilateral lumbar facet block #4 under fluoroscopic guidance and IV sedation Pre-procedure pain level:  4/10 Post-procedure: 4/10 No relief  Sedation: Sedation provided.  Dewayne Shorter, RN  02/28/2019  1:32 PM  Sign when Signing Visit Pain relief after procedure (treated area only): (Questions asked to patient) 1. Starting about 15 minutes after the procedure, and "while the area was still numb" (from the local anesthetics), were you having any of your usual pain "in that area" (the treated area)?  (NOTE: NOT including the discomfort from the needle sticks.) First 1 hour:100% better. First 4-6 hours:100 % better. 2. How long did the numbness from the local anesthetics last? (More than 6 hours?) Duration: Ongoing 3. How much better is your pain now, when compared to before the procedure? Current benefit:100 % better. 4. Can you move better now? Improvement in ROM (Range of Motion): Yes. 5. Can you  do more now? Improvement in function: Yes. 4. Did you have any problems with the procedure? Side-effects/Complications: No.  Current benefits: Defined as benefit that persist at this time.   Analgesia:  90-100% better Function: Mr. Nardiello reports improvement in function ROM: Mr. Stan reports improvement in ROM  Pharmacotherapy Assessment  Analgesic: Tramadol 50mg , 1 tab PO QD (50 mg/day) MME/day: 50mg /day.   Monitoring: Pharmacotherapy: No side-effects or adverse reactions reported. [place] PMP: PDMP reviewed during this encounter.       Compliance: No problems identified. Effectiveness: Clinically acceptable. Plan: Refer to "POC".  UDS:  Summary  Date Value Ref Range Status  09/08/2016 FINAL  Final    Comment:    ==================================================================== TOXASSURE COMP DRUG ANALYSIS,UR ==================================================================== Test                             Result       Flag       Units Drug Present and Declared for Prescription Verification   Gabapentin                     PRESENT      EXPECTED   Acetaminophen                  PRESENT      EXPECTED Drug Absent but Declared for Prescription Verification   Hydrocodone                    Not Detected UNEXPECTED ng/mg creat   Tramadol  Not Detected UNEXPECTED   Salicylate                     Not Detected UNEXPECTED    Aspirin, as indicated in the declared medication list, is not    always detected even when used as directed. ==================================================================== Test                      Result    Flag   Units      Ref Range   Creatinine              23               mg/dL      >=20 ==================================================================== Declared Medications:  The flagging and interpretation on this report are based on the  following declared medications.  Unexpected results may arise from  inaccuracies in  the declared medications.  **Note: The testing scope of this panel includes these medications:  Gabapentin  Hydrocodone (Norco)  Tramadol (Ultram)  **Note: The testing scope of this panel does not include small to  moderate amounts of these reported medications:  Acetaminophen (Norco)  Acetaminophen (Tylenol)  Aspirin (Aspirin 81)  **Note: The testing scope of this panel does not include following  reported medications:  Albuterol (Combivent)  Atorvastatin (Lipitor)  Calcium Carbonate (Calcium carbonate/Vitamin D)  Carvedilol (Coreg)  Cephalexin (Keflex)  Cetirizine (Zyrtec)  Chondroitin (Glucosamine-Chondroitin)  Ezetimibe (Vytorin)  Fluticasone (Flonase)  Formoterol (Dulera)  Furosemide (Lasix)  Glucosamine (Glucosamine-Chondroitin)  Hydralazine  Ipratropium (Combivent)  Iron  Leuprolide Acetate (Lupron)  Losartan (Cozaar)  Meloxicam (Mobic)  Metformin (Glucophage)  Mometasone (Dulera)  Montelukast (Singulair)  Multivitamin  Nifedipine  Omeprazole  Potassium  Pramipexole (Mirapex)  Simvastatin (Vytorin)  Sitagliptin (Januvia)  Supplement  Vitamin D (Calcium carbonate/Vitamin D) ==================================================================== For clinical consultation, please call [phone]. ====================================================================    Laboratory Chemistry Profile (12 mo)  Renal: No results found for requested labs within last 8760 hours.  Lab Results  Component Value Date   GFRAA 97 09/08/2016   GFRNONAA 84 09/08/2016   Hepatic: No results found for requested labs within last 8760 hours. Lab Results  Component Value Date   AST 17 09/08/2016   ALT 15 09/08/2016   Other: No results found for requested labs within last 8760 hours. Note: Above Lab results reviewed.  Imaging  Fluoro (C-Arm) (<60 min) (No Report) Fluoro was used, but no Radiologist interpretation will be provided.  Please refer to "NOTES" tab for  provider progress note.   Assessment  The primary encounter diagnosis was Lumbar facet syndrome (Bilateral) (R>L). Diagnoses of Spondylosis without myelopathy or radiculopathy, lumbosacral region, Osteoarthritis of lumbar spine, Lumbar spondylosis, DDD (degenerative disc disease), lumbar, Chronic low back pain (Primary Area of Pain) (Bilateral) (R>L), Chronic hip pain (Secondary Area of Pain) (Left), Chronic lower extremity pain (Tertiary source of pain) (Bilateral) (L>R), Chronic knee pain (Right), and Osteoarthritis of knee (Right) were also pertinent to this visit.  Plan of Care  Problem-specific:  No problem-specific Assessment & Plan notes found for this encounter.  I am having Kendrix Larocca. Owens Shark maintain his ferrous fumarate, montelukast, sitaGLIPtin, pramipexole, omeprazole, Glucosamine-Chondroitin, atorvastatin, fluticasone, Levemir FlexTouch, acetaminophen, aspirin EC, carvedilol, cetirizine, Ipratropium-Albuterol, furosemide, hydrALAZINE, losartan, mometasone-formoterol, NIFEdipine, Ocuvite Adult 50+, (FLAXSEED, LINSEED, PO), Calcium Carbonate-Vitamin D (CALTRATE 600+D PO), potassium chloride SA, B-D ULTRAFINE III SHORT PEN, magnesium oxide, benzonatate, torsemide, albuterol, traMADol, gabapentin, and metFORMIN.  Pharmacotherapy (Medications Ordered): No orders of the defined  types were placed in this encounter.  Orders:  Orders Placed This Encounter  Procedures  . Steroid Knee Blk (Schedule)    Local Anesthetic & Steroid injection.    Standing Status:   Future    Standing Expiration Date:   03/30/2019    Scheduling Instructions:     Side: Right-sided     Sedation: None     Timeframe: As soon as schedule allows    Order Specific Question:   Where will this procedure be performed?    Answer:   [facility]  . DG Knee (Right)    Standing Status:   Future    Standing Expiration Date:   02/28/2020    Order Specific Question:   Reason for Exam (SYMPTOM  OR DIAGNOSIS REQUIRED)     Answer:   Right knee pain/arthralgia    Order Specific Question:   Preferred imaging location?    Answer:   [facility]    Order Specific Question:   Call Results- Best Contact Number?    Answer:   [id number] (Pain Clinic facility) (Dr. Dossie Arbour)   Follow-up plan:   Return for Procedure (no sedation): (R) IA Knee inj (STEROID) #1.      Interventional treatment options: Planned, scheduled, and/or pending: Palliative bilateral lumbar facet block #4 under fluoroscopic guidance and IV sedation   Under consideration: Diagnostic left femoral + obturator NB Possible left femoral + obturator nerve RFA Diagnostic left L2-3 LESI Diagnostic left L3-4 LESI Diagnosticleft L4-5 LESI Diagnosticleft L2, L3 and L4 TFESI  Diagnostic right L4 and L5 TFESI  Possible bilateral lumbar facet RFA Diagnostic bilateral genicular NB Possible bilateral genicular nerve RFA   Therapeutic/palliative (PRN): Palliative bilateral lumbar facet block#4   Palliativeleft IA hip joint injection #3 Palliative bilateral IA knee injection (steroids) #2  Palliative bilateral IA Hyalgan knee injection #S3N1     Recent Visits Date Type Provider Dept  02/14/19 Procedure visit Milinda Pointer, MD [facility]  02/09/19 Telemedicine Milinda Pointer, MD [facility]  Showing recent visits within past 90 days and meeting all other requirements   Today's Visits Date Type Provider Dept  02/28/19 Telemedicine Milinda Pointer, MD [facility]  Showing today's visits and meeting all other requirements   Future Appointments No visits were found meeting these conditions.  Showing future appointments within next 90 days and meeting all other requirements   I discussed the assessment and treatment plan with the patient. The patient was provided an opportunity to ask questions and all were answered. The patient agreed with the plan and demonstrated an understanding  of the instructions.  Patient advised to call back or seek an in-person evaluation if the symptoms or condition worsens.  Total duration of non-face-to-face encounter: 18 minutes.  Note by: Gaspar Cola, MD Date: 02/28/2019; Time: 2:49 PM

## 2019-02-28 ENCOUNTER — Other Ambulatory Visit: Payer: Self-pay

## 2019-02-28 ENCOUNTER — Ambulatory Visit: Payer: Medicare Other | Attending: Pain Medicine | Admitting: Pain Medicine

## 2019-02-28 ENCOUNTER — Telehealth: Payer: Self-pay | Admitting: *Deleted

## 2019-02-28 DIAGNOSIS — M47817 Spondylosis without myelopathy or radiculopathy, lumbosacral region: Secondary | ICD-10-CM

## 2019-02-28 DIAGNOSIS — M47816 Spondylosis without myelopathy or radiculopathy, lumbar region: Secondary | ICD-10-CM | POA: Diagnosis not present

## 2019-02-28 DIAGNOSIS — M5442 Lumbago with sciatica, left side: Secondary | ICD-10-CM

## 2019-02-28 DIAGNOSIS — M25552 Pain in left hip: Secondary | ICD-10-CM

## 2019-02-28 DIAGNOSIS — M47896 Other spondylosis, lumbar region: Secondary | ICD-10-CM

## 2019-02-28 DIAGNOSIS — M25561 Pain in right knee: Secondary | ICD-10-CM

## 2019-02-28 DIAGNOSIS — M5136 Other intervertebral disc degeneration, lumbar region: Secondary | ICD-10-CM

## 2019-02-28 DIAGNOSIS — M79605 Pain in left leg: Secondary | ICD-10-CM

## 2019-02-28 DIAGNOSIS — M79604 Pain in right leg: Secondary | ICD-10-CM

## 2019-02-28 DIAGNOSIS — G8929 Other chronic pain: Secondary | ICD-10-CM

## 2019-02-28 DIAGNOSIS — M1711 Unilateral primary osteoarthritis, right knee: Secondary | ICD-10-CM

## 2019-02-28 DIAGNOSIS — M51369 Other intervertebral disc degeneration, lumbar region without mention of lumbar back pain or lower extremity pain: Secondary | ICD-10-CM

## 2019-02-28 NOTE — Progress Notes (Signed)
Pain relief after procedure (treated area only): (Questions asked to patient) 1. Starting about 15 minutes after the procedure, and "while the area was still numb" (from the local anesthetics), were you having any of your usual pain "in that area" (the treated area)?  (NOTE: NOT including the discomfort from the needle sticks.) First 1 hour:100% better. First 4-6 hours:100 % better. 2. How long did the numbness from the local anesthetics last? (More than 6 hours?) Duration: Ongoing 3. How much better is your pain now, when compared to before the procedure? Current benefit:100 % better. 4. Can you move better now? Improvement in ROM (Range of Motion): Yes. 5. Can you do more now? Improvement in function: Yes. 4. Did you have any problems with the procedure? Side-effects/Complications: No.

## 2019-02-28 NOTE — Patient Instructions (Signed)
____________________________________________________________________________________________  Preparing for your procedure (without sedation)  Procedure appointments are limited to planned procedures: . No Prescription Refills. . No disability issues will be discussed. . No medication changes will be discussed.  Instructions: . Oral Intake: Do not eat or drink anything for at least 3 hours prior to your procedure. . Transportation: Unless otherwise stated by your physician, you may drive yourself after the procedure. . Blood Pressure Medicine: Take your blood pressure medicine with a sip of water the morning of the procedure. . Blood thinners: Notify our staff if you are taking any blood thinners. Depending on which one you take, there will be specific instructions on how and when to stop it. . Diabetics on insulin: Notify the staff so that you can be scheduled 1st case in the morning. If your diabetes requires high dose insulin, take only  of your normal insulin dose the morning of the procedure and notify the staff that you have done so. . Preventing infections: Shower with an antibacterial soap the morning of your procedure.  . Build-up your immune system: Take 1000 mg of Vitamin C with every meal (3 times a day) the day prior to your procedure. . Antibiotics: Inform the staff if you have a condition or reason that requires you to take antibiotics before dental procedures. . Pregnancy: If you are pregnant, call and cancel the procedure. . Sickness: If you have a cold, fever, or any active infections, call and cancel the procedure. . Arrival: You must be in the facility at least 30 minutes prior to your scheduled procedure. . Children: Do not bring any children with you. . Dress appropriately: Bring dark clothing that you would not mind if they get stained. . Valuables: Do not bring any jewelry or valuables.  Reasons to call and reschedule or cancel your procedure: (Following these  recommendations will minimize the risk of a serious complication.) . Surgeries: Avoid having procedures within 2 weeks of any surgery. (Avoid for 2 weeks before or after any surgery). . Flu Shots: Avoid having procedures within 2 weeks of a flu shots or . (Avoid for 2 weeks before or after immunizations). . Barium: Avoid having a procedure within 7-10 days after having had a radiological study involving the use of radiological contrast. (Myelograms, Barium swallow or enema study). . Heart attacks: Avoid any elective procedures or surgeries for the initial 6 months after a "Myocardial Infarction" (Heart Attack). . Blood thinners: It is imperative that you stop these medications before procedures. Let us know if you if you take any blood thinner.  . Infection: Avoid procedures during or within two weeks of an infection (including chest colds or gastrointestinal problems). Symptoms associated with infections include: Localized redness, fever, chills, night sweats or profuse sweating, burning sensation when voiding, cough, congestion, stuffiness, runny nose, sore throat, diarrhea, nausea, vomiting, cold or Flu symptoms, recent or current infections. It is specially important if the infection is over the area that we intend to treat. . Heart and lung problems: Symptoms that may suggest an active cardiopulmonary problem include: cough, chest pain, breathing difficulties or shortness of breath, dizziness, ankle swelling, uncontrolled high or unusually low blood pressure, and/or palpitations. If you are experiencing any of these symptoms, cancel your procedure and contact your primary care physician for an evaluation.  Remember:  Regular Business hours are:  Monday to Thursday 8:00 AM to 4:00 PM  Provider's Schedule: Dmarcus Decicco, MD:  Procedure days: Tuesday and Thursday 7:30 AM to 4:00 PM  Bilal   Lateef, MD:  Procedure days: Monday and Wednesday 7:30 AM to 4:00  PM ____________________________________________________________________________________________    

## 2019-03-07 ENCOUNTER — Encounter: Payer: Self-pay | Admitting: Pain Medicine

## 2019-03-07 ENCOUNTER — Other Ambulatory Visit: Payer: Self-pay

## 2019-03-07 ENCOUNTER — Ambulatory Visit: Payer: Medicare Other | Attending: Pain Medicine | Admitting: Pain Medicine

## 2019-03-07 VITALS — BP 171/95 | HR 78 | Resp 16 | Ht 69.0 in | Wt 220.0 lb

## 2019-03-07 DIAGNOSIS — G8929 Other chronic pain: Secondary | ICD-10-CM | POA: Insufficient documentation

## 2019-03-07 DIAGNOSIS — M1711 Unilateral primary osteoarthritis, right knee: Secondary | ICD-10-CM | POA: Diagnosis present

## 2019-03-07 DIAGNOSIS — M25561 Pain in right knee: Secondary | ICD-10-CM | POA: Diagnosis present

## 2019-03-07 MED ORDER — ROPIVACAINE HCL 2 MG/ML IJ SOLN
4.0000 mL | Freq: Once | INTRAMUSCULAR | Status: AC
  Administered 2019-03-07: 4 mL via INTRA_ARTICULAR
  Filled 2019-03-07: qty 10

## 2019-03-07 MED ORDER — METHYLPREDNISOLONE ACETATE 80 MG/ML IJ SUSP
80.0000 mg | Freq: Once | INTRAMUSCULAR | Status: AC
  Administered 2019-03-07: 80 mg via INTRA_ARTICULAR
  Filled 2019-03-07: qty 1

## 2019-03-07 MED ORDER — LIDOCAINE HCL 2 % IJ SOLN
3.0000 mL | Freq: Once | INTRAMUSCULAR | Status: AC
  Administered 2019-03-07: 60 mg
  Filled 2019-03-07: qty 20

## 2019-03-07 NOTE — Patient Instructions (Signed)

## 2019-03-07 NOTE — Progress Notes (Signed)
PROVIDER NOTE: Information contained herein reflects review and annotations entered in association with encounter. Interpretation of such information and data should be left to medically-trained personnel. Information provided to patient can be located elsewhere in the medical record under "Patient Instructions". Document created using STT-dictation technology, any transcriptional errors that may result from process are unintentional.   Patient's Name: Joshua Humphrey  MRN: [id number]  Referring Provider: Kirk Ruths, MD  DOB: August 17, 1929  PCP: Kirk Ruths, MD  DOS: 03/07/2019  Note by: Gaspar Cola, MD  Service setting: Ambulatory outpatient  Specialty: Interventional Pain Management  Patient type: Established  Location: [facility] (AMB) Pain Management Facility  Visit type: Interventional Procedure   Primary Reason for Visit: Interventional Pain Management Treatment. CC: Knee Pain (right )  Procedure:          Anesthesia, Analgesia, Anxiolysis:  Type: Diagnostic Intra-Articular Local anesthetic and steroid Knee Injection          Region: Lateral infrapatellar Knee Region Level: Knee Joint Laterality: Right knee  Type: Local Anesthesia Indication(s): Analgesia         Local Anesthetic: Lidocaine 1-2% Route: Infiltration ([place]/IM) IV Access: Declined Sedation: Declined   Position: Sitting   Indications: 1. Chronic knee pain (Right)   2. Osteoarthritis of knee (Right)    Pain Score: Pre-procedure: 4 /10 Post-procedure: 0-No pain/10   Pre-op Assessment:  Joshua Humphrey is a 85 y.o. (year old), male patient, seen today for interventional treatment. He  has a past surgical history that includes tumor removed (2000); Prostate surgery (2002); lipoma removal (2000); Tonsillectomy; Cataract extraction, bilateral; Toe Surgery; Olecranon bursectomy (Left, 01/21/2016); Elbow surgery; and Eye surgery. Joshua Humphrey has a current medication list which includes the following prescription(s):  acetaminophen, albuterol, aspirin ec, atorvastatin, b-d ultrafine iii short pen, benzonatate, calcium carbonate-vitamin d, carvedilol, cetirizine, ferrous fumarate, flaxseed (linseed), fluticasone, furosemide, gabapentin, glucosamine-chondroitin, hydralazine, ipratropium-albuterol, levemir flextouch, losartan, magnesium oxide, metformin, mometasone-formoterol, montelukast, ocuvite adult 50+, nifedipine, omeprazole, potassium chloride sa, pramipexole, sitagliptin, tramadol, and torsemide. His primarily concern today is the Knee Pain (right )  Initial Vital Signs:  Pulse/HCG Rate: 78  Temp:   Resp: 16 BP: (!) 171/95 SpO2: 92 %  BMI: Estimated body mass index is 32.49 kg/m as calculated from the following:   Height as of this encounter: 5\' 9"  (1.753 m).   Weight as of this encounter: 220 lb (99.8 kg).  Risk Assessment: Allergies: Reviewed. He has No Known Allergies.  Allergy Precautions: None required Coagulopathies: Reviewed. None identified.  Blood-thinner therapy: None at this time Active Infection(s): Reviewed. None identified. Joshua Humphrey is afebrile  Site Confirmation: Joshua Humphrey was asked to confirm the procedure and laterality before marking the site Procedure checklist: Completed Consent: Before the procedure and under the influence of no sedative(s), amnesic(s), or anxiolytics, the patient was informed of the treatment options, risks and possible complications. To fulfill our ethical and legal obligations, as recommended by the American Medical Association's Code of Ethics, I have informed the patient of my clinical impression; the nature and purpose of the treatment or procedure; the risks, benefits, and possible complications of the intervention; the alternatives, including doing nothing; the risk(s) and benefit(s) of the alternative treatment(s) or procedure(s); and the risk(s) and benefit(s) of doing nothing. The patient was provided information about the general risks and possible  complications associated with the procedure. These may include, but are not limited to: failure to achieve desired goals, infection, bleeding, organ or nerve damage, allergic reactions, paralysis, and  death. In addition, the patient was informed of those risks and complications associated to the procedure, such as failure to decrease pain; infection; bleeding; organ or nerve damage with subsequent damage to sensory, motor, and/or autonomic systems, resulting in permanent pain, numbness, and/or weakness of one or several areas of the body; allergic reactions; (i.e.: anaphylactic reaction); and/or death. Furthermore, the patient was informed of those risks and complications associated with the medications. These include, but are not limited to: allergic reactions (i.e.: anaphylactic or anaphylactoid reaction(s)); adrenal axis suppression; blood sugar elevation that in diabetics may result in ketoacidosis or comma; water retention that in patients with history of congestive heart failure may result in shortness of breath, pulmonary edema, and decompensation with resultant heart failure; weight gain; swelling or edema; medication-induced neural toxicity; particulate matter embolism and blood vessel occlusion with resultant organ, and/or nervous system infarction; and/or aseptic necrosis of one or more joints. Finally, the patient was informed that Medicine is not an exact science; therefore, there is also the possibility of unforeseen or unpredictable risks and/or possible complications that may result in a catastrophic outcome. The patient indicated having understood very clearly. We have given the patient no guarantees and we have made no promises. Enough time was given to the patient to ask questions, all of which were answered to the patient's satisfaction. Joshua Humphrey has indicated that he wanted to continue with the procedure. Attestation: I, the ordering provider, attest that I have discussed with the patient  the benefits, risks, side-effects, alternatives, likelihood of achieving goals, and potential problems during recovery for the procedure that I have provided informed consent. Date  Time: 03/07/2019 11:01 AM  Pre-Procedure Preparation:  Monitoring: As per clinic protocol. Respiration, ETCO2, SpO2, BP, heart rate and rhythm monitor placed and checked for adequate function Safety Precautions: Patient was assessed for positional comfort and pressure points before starting the procedure. Time-out: I initiated and conducted the "Time-out" before starting the procedure, as per protocol. The patient was asked to participate by confirming the accuracy of the "Time Out" information. Verification of the correct person, site, and procedure were performed and confirmed by me, the nursing staff, and the patient. "Time-out" conducted as per Joint Commission's Universal Protocol (UP.01.01.01). Time: 1139  Description of Procedure:          Target Area: Knee Joint Approach: Just above the Lateral tibial plateau, lateral to the infrapatellar tendon. Area Prepped: Entire knee area, from the mid-thigh to the mid-shin. Prepping solution: DuraPrep (Iodine Povacrylex [0.7% available iodine] and Isopropyl Alcohol, 74% w/w) Safety Precautions: Aspiration looking for blood return was conducted prior to all injections. At no point did we inject any substances, as a needle was being advanced. No attempts were made at seeking any paresthesias. Safe injection practices and needle disposal techniques used. Medications properly checked for expiration dates. SDV (single dose vial) medications used. Description of the Procedure: Protocol guidelines were followed. The patient was placed in position over the fluoroscopy table. The target area was identified and the area prepped in the usual manner. Skin & deeper tissues infiltrated with local anesthetic. Appropriate amount of time allowed to pass for local anesthetics to take effect.  The procedure needles were then advanced to the target area. Proper needle placement secured. Negative aspiration confirmed. Solution injected in intermittent fashion, asking for systemic symptoms every 0.5cc of injectate. The needles were then removed and the area cleansed, making sure to leave some of the prepping solution back to take advantage of its long term  bactericidal properties. Vitals:   03/07/19 1059  BP: (!) 171/95  Pulse: 78  Resp: 16  SpO2: 92%  Weight: 220 lb (99.8 kg)  Height: 5\' 9"  (1.753 m)    Start Time: 1139 hrs. End Time: 1141 hrs. Materials:  Needle(s) Type: Regular needle Gauge: 25G Length: 1.5-in Medication(s): Please see orders for medications and dosing details.  Imaging Guidance:          Type of Imaging Technique: None used Indication(s): N/A Exposure Time: No patient exposure Contrast: None used. Fluoroscopic Guidance: N/A Ultrasound Guidance: N/A Interpretation: N/A  Antibiotic Prophylaxis:   Anti-infectives (From admission, onward)   None     Indication(s): None identified  Post-operative Assessment:  Post-procedure Vital Signs:  Pulse/HCG Rate: 78  Temp:   Resp: 16 BP: (!) 171/95 SpO2: 92 %  EBL: None  Complications: No immediate post-treatment complications observed by team, or reported by patient.  Note: The patient tolerated the entire procedure well. A repeat set of vitals were taken after the procedure and the patient was kept under observation following institutional policy, for this type of procedure. Post-procedural neurological assessment was performed, showing return to baseline, prior to discharge. The patient was provided with post-procedure discharge instructions, including a section on how to identify potential problems. Should any problems arise concerning this procedure, the patient was given instructions to immediately contact us, at any time, without hesitation. In any case, we plan to contact the patient by telephone  for a follow-up status report regarding this interventional procedure.  Comments:  No additional relevant information.  Plan of Care  Orders:  Orders Placed This Encounter  Procedures  . Steroid Knee Blk (Today)    Local Anesthetic & Steroid injection.    Scheduling Instructions:     Side(s): Right Knee     Sedation: None     Timeframe: Today    Order Specific Question:   Where will this procedure be performed?    Answer:   [facility]  . Consent: IA Knee inj. (Steroid) (R)    Provider Attestation: I, [place] Dossie Arbour, MD, (Pain Management Specialist), the physician/practitioner, attest that I have discussed with the patient the benefits, risks, side effects, alternatives, likelihood of achieving goals and potential problems during recovery for the procedure that I have provided informed consent.    Scheduling Instructions:     Procedure: Right-sided intra-articular knee arthrocentesis (aspiration and/or injection)     Indications: Chronic right-sided knee pain secondary to knee arthropathy/arthralgia     Note: Always confirm laterality of pain with Mr. Hanes, before procedure.     Transcribe to consent form and obtain patient signature.   Chronic Opioid Analgesic:  Tramadol 50mg , 1 tab PO QD (50 mg/day) MME/day: 50mg /day.   Medications ordered for procedure: Meds ordered this encounter  Medications  . methylPREDNISolone acetate (DEPO-MEDROL) injection 80 mg  . ropivacaine (PF) 2 mg/mL (0.2%) (NAROPIN) injection 4 mL  . lidocaine (XYLOCAINE) 2 % (with pres) injection 60 mg   Medications administered: We administered methylPREDNISolone acetate, ropivacaine (PF) 2 mg/mL (0.2%), and lidocaine.  See the medical record for exact dosing, route, and time of administration.  Follow-up plan:   Return in about 2 weeks (around 03/21/2019) for (VV), (PP).       Interventional treatment options: Planned, scheduled, and/or pending: Palliative bilateral lumbar facet block  #4 under fluoroscopic guidance and IV sedation   Under consideration: Diagnostic left femoral + obturator NB Possible left femoral + obturator nerve RFA Diagnostic left  L2-3 LESI Diagnostic left L3-4 LESI Diagnosticleft L4-5 LESI Diagnosticleft L2, L3 and L4 TFESI  Diagnostic right L4 and L5 TFESI  Possible bilateral lumbar facet RFA Diagnostic bilateral genicular NB Possible bilateral genicular nerve RFA   Therapeutic/palliative (PRN): Palliative bilateral lumbar facet block#4   Palliativeleft IA hip joint injection #3 Palliative bilateral IA knee injection (steroids) #2  Palliative bilateral IA Hyalgan knee injection #S3N1      Recent Visits Date Type Provider Dept  02/28/19 Telemedicine Milinda Pointer, [facility]  02/14/19 Procedure visit Milinda Pointer, MD [facility]  02/09/19 Telemedicine Milinda Pointer, MD [facility]  Showing recent visits within past 90 days and meeting all other requirements   Today's Visits Date Type Provider Dept  03/07/19 Procedure visit Milinda Pointer, MD [facility]  Showing today's visits and meeting all other requirements   Future Appointments Date Type Provider Dept  03/22/19 Appointment Milinda Pointer, MD [facility]  Showing future appointments within next 90 days and meeting all other requirements   Disposition: Discharge home  Discharge Date & Time: 03/07/2019; 1141 hrs.   Primary Care Physician: Kirk Ruths, MD Location: [facility] Note by: Gaspar Cola, MD Date: 03/07/2019; Time: 12:29 PM  Disclaimer:  Medicine is not an Chief Strategy Officer. The only guarantee in medicine is that nothing is guaranteed. It is important to note that the decision to proceed with this intervention was based on the information collected from the patient. The Data and conclusions were drawn from the patient's questionnaire,  the interview, and the physical examination. Because the information was provided in large part by the patient, it cannot be guaranteed that it has not been purposely or unconsciously manipulated. Every effort has been made to obtain as much relevant data as possible for this evaluation. It is important to note that the conclusions that lead to this procedure are derived in large part from the available data. Always take into account that the treatment will also be dependent on availability of resources and existing treatment guidelines, considered by other Pain Management Practitioners as being common knowledge and practice, at the time of the intervention. For Medico-Legal purposes, it is also important to point out that variation in procedural techniques and pharmacological choices are the acceptable norm. The indications, contraindications, technique, and results of the above procedure should only be interpreted and judged by a Board-Certified Interventional Pain Specialist with extensive familiarity and expertise in the same exact procedure and technique.

## 2019-03-08 ENCOUNTER — Telehealth: Payer: Self-pay

## 2019-03-08 NOTE — Telephone Encounter (Signed)
Post procedure phone call. Patient states he is doing well.  

## 2019-03-20 ENCOUNTER — Encounter: Payer: Self-pay | Admitting: Pain Medicine

## 2019-03-21 ENCOUNTER — Encounter: Payer: Self-pay | Admitting: Pain Medicine

## 2019-03-21 NOTE — Progress Notes (Signed)
Patient: Joshua Humphrey  Service Category: E/M  Provider: Gaspar Cola, MD  DOB: May 10, 1929  DOS: 03/22/2019  Location: Office  MRN: [id number]  Setting: Ambulatory outpatient  Referring Provider: Kirk Ruths, MD  Type: Established Patient  Specialty: Interventional Pain Management  PCP: Kirk Ruths, MD  Location: Remote location  Delivery: TeleHealth     Virtual Encounter - Pain Management PROVIDER NOTE: Information contained herein reflects review and annotations entered in association with encounter. Interpretation of such information and data should be left to medically-trained personnel. Information provided to patient can be located elsewhere in the medical record under "Patient Instructions". Document created using STT-dictation technology, any transcriptional errors that may result from process are unintentional.    Contact & Pharmacy Preferred: [phone] Home: [phone] (home) Mobile: [phone] (mobile) E-mail: [email or web address]  [name] [address], [place] [place] [address] [place] [address] Phone: [phone] Fax: [place], [place] [facility] [address] [place] [address] [place] [address] Phone: [phone] Fax: [phone]   Pre-screening  Joshua Humphrey offered "in-person" vs "virtual" encounter. He indicated preferring virtual for this encounter.   Reason COVID-19*  Social distancing based on CDC and AMA recommendations.   I contacted Joshua Humphrey on 03/22/2019 via telephone.      I clearly identified myself as Gaspar Cola, MD. I verified that I was speaking with the correct person using two identifiers (Name: Joshua Humphrey, and date of birth: [birth year]).  Consent I sought verbal advanced consent from Joshua Humphrey for virtual visit interactions. I informed Joshua Humphrey of possible security and privacy  concerns, risks, and limitations associated with providing "not-in-person" medical evaluation and management services. I also informed Joshua Humphrey of the availability of "in-person" appointments. Finally, I informed him that there would be a charge for the virtual visit and that he could be  personally, fully or partially, financially responsible for it. Joshua Humphrey expressed understanding and agreed to proceed.   Historic Elements   Joshua Humphrey is a 86 y.o. year old, male patient evaluated today after his last encounter by our practice on 03/08/2019. Joshua Humphrey  has a past medical history of Anemia, Asthma, Cancer ([place]), Cancer ([place]), Diabetes mellitus without complication ([place]), GERD (gastroesophageal reflux disease), Hypertension, PONV (postoperative nausea and vomiting), Respiratory infection (08/23/2017), Septic olecranon bursitis of left elbow (01/22/2016), and Sleep apnea. He also  has a past surgical history that includes tumor removed (2000); Prostate surgery (2002); lipoma removal (2000); Tonsillectomy; Cataract extraction, bilateral; Toe Surgery; Olecranon bursectomy (Left, 01/21/2016); Elbow surgery; and Eye surgery. Joshua Humphrey has a current medication list which includes the following prescription(s): acetaminophen, albuterol, aspirin ec, atorvastatin, b-d ultrafine iii short pen, benzonatate, calcium carbonate-vitamin d, carvedilol, cetirizine, ferrous fumarate, flaxseed (linseed), fluticasone, furosemide, gabapentin, glucosamine-chondroitin, hydralazine, ipratropium-albuterol, levemir flextouch, losartan, magnesium oxide, metformin, mometasone-formoterol, montelukast, ocuvite adult 50+, nifedipine, omeprazole, potassium chloride sa, pramipexole, sitagliptin, tramadol, and torsemide. He  reports that he has never smoked. He has never used smokeless tobacco. He reports current alcohol use. He reports that he does not use drugs. Joshua Humphrey has No Known Allergies.   HPI  Today, he is being contacted  for a post-procedure assessment.  The patient seems to have done well with the injection however, he did not get 100% relief of the pain and he would be interested in repeating  the intra-articular Hyalgan knee injection.  We will go ahead and schedule that for him as soon as possible.  He has requested to have both sides done.  Post-Procedure Evaluation  Procedure: Diagnostic right intra-articular steroid knee joint injection #1, no fluoroscopy or IV sedation Pre-procedure pain level:  4/10 Post-procedure: 0/10 (100% relief)  Sedation: None.  Joshua Humphrey, Joshua Humphrey  03/21/2019  1:30 PM  Sign when Signing Visit Pain relief after procedure (treated area only): (Questions asked to patient) 1. Starting about 15 minutes after the procedure, and "while the area was still numb" (from the local anesthetics), were you having any of your usual pain "in that area" (the treated area)?  (NOTE: NOT including the discomfort from the needle sticks.) First 1 hour:100% better. First 4-6 hours: 100 % better. 2. How long did the numbness from the local anesthetics last? (More than 6 hours?) Duration: 24 hours.  3. How much better is your pain now, when compared to before the procedure? Current benefit: 50 % better. 4. Can you move better now? Improvement in ROM (Range of Motion): Yes. 5. Can you do more now? Improvement in function: Yes. 4. Did you have any problems with the procedure? Side-effects/Complications: No.  Current benefits: Defined as benefit that persist at this time.   Analgesia:  50% improved.  Today we have gone over the results and since he did not get complete relief of the pain I have offered to repeat the Hyalgan knee injections.  He has had those done in the past, bilaterally.  Today he has indicated to me that he would be interested in repeating those, also bilaterally.  We will go ahead and set that up for him as soon as possible. Function: Joshua Humphrey reports improvement in function ROM:  Joshua Humphrey reports improvement in ROM  Pharmacotherapy Assessment  Analgesic: Tramadol 50mg , 1 tab PO QD (50 mg/day) MME/day: 50mg /day.   Monitoring: Pharmacotherapy: No side-effects or adverse reactions reported. [place] PMP: PDMP reviewed during this encounter.       Compliance: No problems identified. Effectiveness: Clinically acceptable. Plan: Refer to "POC".  UDS:  Summary  Date Value Ref Range Status  09/08/2016 FINAL  Final    Comment:    ==================================================================== TOXASSURE COMP DRUG ANALYSIS,UR ==================================================================== Test                             Result       Flag       Units Drug Present and Declared for Prescription Verification   Gabapentin                     PRESENT      EXPECTED   Acetaminophen                  PRESENT      EXPECTED Drug Absent but Declared for Prescription Verification   Hydrocodone                    Not Detected UNEXPECTED ng/mg creat   Tramadol                       Not Detected UNEXPECTED   Salicylate                     Not Detected UNEXPECTED    Aspirin, as indicated in the declared medication list, is not  always detected even when used as directed. ==================================================================== Test                      Result    Flag   Units      Ref Range   Creatinine              23               mg/dL      >=20 ==================================================================== Declared Medications:  The flagging and interpretation on this report are based on the  following declared medications.  Unexpected results may arise from  inaccuracies in the declared medications.  **Note: The testing scope of this panel includes these medications:  Gabapentin  Hydrocodone (Norco)  Tramadol (Ultram)  **Note: The testing scope of this panel does not include small to  moderate amounts of these reported medications:   Acetaminophen (Norco)  Acetaminophen (Tylenol)  Aspirin (Aspirin 81)  **Note: The testing scope of this panel does not include following  reported medications:  Albuterol (Combivent)  Atorvastatin (Lipitor)  Calcium Carbonate (Calcium carbonate/Vitamin D)  Carvedilol (Coreg)  Cephalexin (Keflex)  Cetirizine (Zyrtec)  Chondroitin (Glucosamine-Chondroitin)  Ezetimibe (Vytorin)  Fluticasone (Flonase)  Formoterol (Dulera)  Furosemide (Lasix)  Glucosamine (Glucosamine-Chondroitin)  Hydralazine  Ipratropium (Combivent)  Iron  Leuprolide Acetate (Lupron)  Losartan (Cozaar)  Meloxicam (Mobic)  Metformin (Glucophage)  Mometasone (Dulera)  Montelukast (Singulair)  Multivitamin  Nifedipine  Omeprazole  Potassium  Pramipexole (Mirapex)  Simvastatin (Vytorin)  Sitagliptin (Januvia)  Supplement  Vitamin D (Calcium carbonate/Vitamin D) ==================================================================== For clinical consultation, please call [phone]. ====================================================================    Laboratory Chemistry Profile (12 mo)  Renal: No results found for requested labs within last 8760 hours.  Lab Results  Component Value Date   GFRAA 97 09/08/2016   GFRNONAA 84 09/08/2016   Hepatic: No results found for requested labs within last 8760 hours. Lab Results  Component Value Date   AST 17 09/08/2016   ALT 15 09/08/2016   Other: No results found for requested labs within last 8760 hours. Note: Above Lab results reviewed.  Imaging  Fluoro (C-Arm) (<60 min) (No Report) Fluoro was used, but no Radiologist interpretation will be provided.  Please refer to "NOTES" tab for provider progress note.   Assessment  The primary encounter diagnosis was Chronic pain syndrome. Diagnoses of Chronic low back pain (Primary Area of Pain) (Bilateral) (R>L), Chronic hip pain (Secondary Area of Pain) (Left), Chronic lower extremity pain (Tertiary source of  pain) (Bilateral) (L>R), Chronic knee pain (Bilateral) (L>R), and Osteoarthritis of knee (Bilateral) were also pertinent to this visit.  Plan of Care  Problem-specific:  No problem-specific Assessment & Plan notes found for this encounter.  I am having Mylz Stivers. Owens Shark maintain his ferrous fumarate, montelukast, sitaGLIPtin, pramipexole, omeprazole, Glucosamine-Chondroitin, atorvastatin, fluticasone, Levemir FlexTouch, acetaminophen, aspirin EC, carvedilol, cetirizine, Ipratropium-Albuterol, furosemide, hydrALAZINE, losartan, mometasone-formoterol, NIFEdipine, Ocuvite Adult 50+, (FLAXSEED, LINSEED, PO), Calcium Carbonate-Vitamin D (CALTRATE 600+D PO), potassium chloride SA, B-D ULTRAFINE III SHORT PEN, magnesium oxide, benzonatate, torsemide, albuterol, traMADol, gabapentin, and metFORMIN.  Pharmacotherapy (Medications Ordered): No orders of the defined types were placed in this encounter.  Orders:  Orders Placed This Encounter  Procedures  . Hyalgan Knee Inj. (Schedule)    Hyalgan knee injection. Please order Hyalgan.    Standing Status:   Future    Standing Expiration Date:   04/22/2019    Scheduling Instructions:     Procedure: Intra-articular Hyalgan Knee injection #1  Side: Bilateral     Sedation: None     Timeframe: in two (2) weeks    Order Specific Question:   Where will this procedure be performed?    Answer:   [facility]   Follow-up plan:   Return in about 5 months (around 08/07/2019) for (VV), (MM), in addition, Procedure (no sedation): (B) Hyalgan Knee inj #1.      Interventional treatment options: Planned, scheduled, and/or pending: Palliative bilateral lumbar facet block #4 under fluoroscopic guidance and IV sedation   Under consideration: Diagnostic left femoral + obturator NB Possible left femoral + obturator nerve RFA Diagnostic left L2-3 LESI Diagnostic left L3-4 LESI Diagnosticleft L4-5 LESI Diagnosticleft L2, L3 and L4 TFESI  Diagnostic  right L4 and L5 TFESI  Possible bilateral lumbar facet RFA Diagnostic bilateral genicular NB Possible bilateral genicular nerve RFA   Therapeutic/palliative (PRN): Palliative bilateral lumbar facet block#4   Palliativeleft IA hip joint injection #3 Palliative bilateral IA knee injection (steroids) #2  Palliative bilateral IA Hyalgan knee injection #[id number]       Recent Visits Date Type Provider Dept  03/07/19 Procedure visit Milinda Pointer, MD [facility]  02/28/19 Telemedicine Milinda Pointer, MD [facility]  02/14/19 Procedure visit Milinda Pointer, MD [facility]  02/09/19 Telemedicine Milinda Pointer, MD [facility]  Showing recent visits within past 90 days and meeting all other requirements   Today's Visits Date Type Provider Dept  03/22/19 Telemedicine Milinda Pointer, MD [facility]  Showing today's visits and meeting all other requirements   Future Appointments No visits were found meeting these conditions.  Showing future appointments within next 90 days and meeting all other requirements   I discussed the assessment and treatment plan with the patient. The patient was provided an opportunity to ask questions and all were answered. The patient agreed with the plan and demonstrated an understanding of the instructions.  Patient advised to call back or seek an in-person evaluation if the symptoms or condition worsens.  Total duration of non-face-to-face encounter: 12 minutes.  Note by: Gaspar Cola, MD Date: 03/22/2019; Time: 1:00 PM

## 2019-03-21 NOTE — Progress Notes (Signed)
Pain relief after procedure (treated area only): (Questions asked to patient) 1. Starting about 15 minutes after the procedure, and "while the area was still numb" (from the local anesthetics), were you having any of your usual pain "in that area" (the treated area)?  (NOTE: NOT including the discomfort from the needle sticks.) First 1 hour:100% better. First 4-6 hours: 100 % better. 2. How long did the numbness from the local anesthetics last? (More than 6 hours?) Duration: 24 hours.  3. How much better is your pain now, when compared to before the procedure? Current benefit: 50 % better. 4. Can you move better now? Improvement in ROM (Range of Motion): Yes. 5. Can you do more now? Improvement in function: Yes. 4. Did you have any problems with the procedure? Side-effects/Complications: No.

## 2019-03-22 ENCOUNTER — Other Ambulatory Visit: Payer: Self-pay

## 2019-03-22 ENCOUNTER — Ambulatory Visit: Payer: Medicare HMO | Attending: Pain Medicine | Admitting: Pain Medicine

## 2019-03-22 DIAGNOSIS — M25562 Pain in left knee: Secondary | ICD-10-CM | POA: Diagnosis not present

## 2019-03-22 DIAGNOSIS — G8929 Other chronic pain: Secondary | ICD-10-CM

## 2019-03-22 DIAGNOSIS — M25561 Pain in right knee: Secondary | ICD-10-CM | POA: Diagnosis not present

## 2019-03-22 DIAGNOSIS — M79605 Pain in left leg: Secondary | ICD-10-CM

## 2019-03-22 DIAGNOSIS — G894 Chronic pain syndrome: Secondary | ICD-10-CM

## 2019-03-22 DIAGNOSIS — M79604 Pain in right leg: Secondary | ICD-10-CM

## 2019-03-22 DIAGNOSIS — M170 Bilateral primary osteoarthritis of knee: Secondary | ICD-10-CM

## 2019-03-22 DIAGNOSIS — M5442 Lumbago with sciatica, left side: Secondary | ICD-10-CM | POA: Diagnosis not present

## 2019-03-22 DIAGNOSIS — M25552 Pain in left hip: Secondary | ICD-10-CM

## 2019-03-22 DIAGNOSIS — M5441 Lumbago with sciatica, right side: Secondary | ICD-10-CM

## 2019-03-27 NOTE — Progress Notes (Signed)
Unable to contact patient regarding xray, no answer and mailbox is full.

## 2019-03-30 ENCOUNTER — Ambulatory Visit: Payer: Medicare HMO | Attending: Pain Medicine | Admitting: Pain Medicine

## 2019-03-30 ENCOUNTER — Other Ambulatory Visit: Payer: Self-pay

## 2019-03-30 ENCOUNTER — Encounter: Payer: Self-pay | Admitting: Pain Medicine

## 2019-03-30 VITALS — BP 185/90 | HR 71 | Temp 98.8°F | Resp 18 | Ht 69.0 in | Wt 220.0 lb

## 2019-03-30 DIAGNOSIS — M170 Bilateral primary osteoarthritis of knee: Secondary | ICD-10-CM | POA: Insufficient documentation

## 2019-03-30 DIAGNOSIS — M25562 Pain in left knee: Secondary | ICD-10-CM | POA: Diagnosis not present

## 2019-03-30 DIAGNOSIS — M25561 Pain in right knee: Secondary | ICD-10-CM | POA: Diagnosis not present

## 2019-03-30 DIAGNOSIS — G8929 Other chronic pain: Secondary | ICD-10-CM | POA: Diagnosis not present

## 2019-03-30 MED ORDER — LIDOCAINE HCL (PF) 1 % IJ SOLN
5.0000 mL | Freq: Once | INTRAMUSCULAR | Status: AC
  Administered 2019-03-30: 5 mL

## 2019-03-30 MED ORDER — SODIUM HYALURONATE (VISCOSUP) 20 MG/2ML IX SOSY: 2.0000 mL | PREFILLED_SYRINGE | Freq: Once | INTRA_ARTICULAR | Status: AC

## 2019-03-30 MED ORDER — ROPIVACAINE HCL 2 MG/ML IJ SOLN
5.0000 mL | Freq: Once | INTRAMUSCULAR | Status: AC
  Administered 2019-03-30: 11:00:00 10 mL via INTRA_ARTICULAR

## 2019-03-30 MED ORDER — LIDOCAINE HCL (PF) 1 % IJ SOLN
INTRAMUSCULAR | Status: AC
  Filled 2019-03-30: qty 5

## 2019-03-30 MED ORDER — ROPIVACAINE HCL 2 MG/ML IJ SOLN
INTRAMUSCULAR | Status: AC
  Filled 2019-03-30: qty 10

## 2019-03-30 NOTE — Progress Notes (Signed)
Safety precautions to be maintained throughout the outpatient stay will include: orient to surroundings, keep bed in low position, maintain call bell within reach at all times, provide assistance with transfer out of bed and ambulation.  

## 2019-03-30 NOTE — Patient Instructions (Signed)

## 2019-03-30 NOTE — Progress Notes (Signed)
PROVIDER NOTE: Information contained herein reflects review and annotations entered in association with encounter. Interpretation of such information and data should be left to medically-trained personnel. Information provided to patient can be located elsewhere in the medical record under "Patient Instructions". Document created using STT-dictation technology, any transcriptional errors that may result from process are unintentional.    Patient: Joshua Humphrey  Service Category: Procedure  Provider: Gaspar Cola, MD  DOB: Aug 27, 1929  DOS: 03/30/2019  Location: [facility]  MRN: [id number]  Setting: Ambulatory - outpatient  Referring Provider: Milinda Pointer, MD  Type: Established Patient  Specialty: Interventional Pain Management  PCP: Kirk Ruths, MD   Primary Reason for Visit: Interventional Pain Management Treatment. CC: Knee Pain (bilateral)  Procedure:          Anesthesia, Analgesia, Anxiolysis:  Type: Therapeutic Intra-Articular Hyalgan Knee Injection #S3N1  Region: Lateral infrapatellar Knee Region Level: Knee Joint Laterality: Bilateral  Type: Local Anesthesia Indication(s): Analgesia         Local Anesthetic: Lidocaine 1-2% Route: Infiltration ([place]/IM) IV Access: Declined Sedation: Declined   Position: Sitting   Indications: 1. Osteoarthritis of knee (Bilateral)   2. Chronic knee pain (Bilateral) (L>R)    Pain Score: Pre-procedure: 4 /10 Post-procedure: 4 /10   Pre-op Assessment:  Mr. Baldelli is a 86 y.o. (year old), male patient, seen today for interventional treatment. He  has a past surgical history that includes tumor removed (2000); Prostate surgery (2002); lipoma removal (2000); Tonsillectomy; Cataract extraction, bilateral; Toe Surgery; Olecranon bursectomy (Left, 01/21/2016); Elbow surgery; and Eye surgery. Mr. Sobel has a current medication list which includes the following prescription(s): acetaminophen, albuterol, aspirin ec,  atorvastatin, b-d ultrafine iii short pen, benzonatate, calcium carbonate-vitamin d, carvedilol, cetirizine, ferrous fumarate, flaxseed (linseed), fluticasone, furosemide, gabapentin, glucosamine-chondroitin, hydralazine, ipratropium-albuterol, levemir flextouch, losartan, magnesium oxide, metformin, mometasone-formoterol, montelukast, ocuvite adult 50+, nifedipine, omeprazole, potassium chloride sa, pramipexole, sitagliptin, tramadol, and torsemide. His primarily concern today is the Knee Pain (bilateral)  Initial Vital Signs:  Pulse/HCG Rate: 71  Temp: 98.8 F (37.1 C) Resp: 18 BP: (!) 185/90 SpO2: 91 %  BMI: Estimated body mass index is 32.49 kg/m as calculated from the following:   Height as of this encounter: 5\' 9"  (1.753 m).   Weight as of this encounter: 220 lb (99.8 kg).  Risk Assessment: Allergies: Reviewed. He has No Known Allergies.  Allergy Precautions: None required Coagulopathies: Reviewed. None identified.  Blood-thinner therapy: None at this time Active Infection(s): Reviewed. None identified. Mr. Bracci is afebrile  Site Confirmation: Mr. Suver was asked to confirm the procedure and laterality before marking the site Procedure checklist: Completed Consent: Before the procedure and under the influence of no sedative(s), amnesic(s), or anxiolytics, the patient was informed of the treatment options, risks and possible complications. To fulfill our ethical and legal obligations, as recommended by the American Medical Association's Code of Ethics, I have informed the patient of my clinical impression; the nature and purpose of the treatment or procedure; the risks, benefits, and possible complications of the intervention; the alternatives, including doing nothing; the risk(s) and benefit(s) of the alternative treatment(s) or procedure(s); and the risk(s) and benefit(s) of doing nothing. The patient was provided information about the general risks and possible complications  associated with the procedure. These may include, but are not limited to: failure to achieve desired goals, infection, bleeding, organ or nerve damage, allergic reactions, paralysis, and death. In addition, the patient was informed of those risks and complications associated to  the procedure, such as failure to decrease pain; infection; bleeding; organ or nerve damage with subsequent damage to sensory, motor, and/or autonomic systems, resulting in permanent pain, numbness, and/or weakness of one or several areas of the body; allergic reactions; (i.e.: anaphylactic reaction); and/or death. Furthermore, the patient was informed of those risks and complications associated with the medications. These include, but are not limited to: allergic reactions (i.e.: anaphylactic or anaphylactoid reaction(s)); adrenal axis suppression; blood sugar elevation that in diabetics may result in ketoacidosis or comma; water retention that in patients with history of congestive heart failure may result in shortness of breath, pulmonary edema, and decompensation with resultant heart failure; weight gain; swelling or edema; medication-induced neural toxicity; particulate matter embolism and blood vessel occlusion with resultant organ, and/or nervous system infarction; and/or aseptic necrosis of one or more joints. Finally, the patient was informed that Medicine is not an exact science; therefore, there is also the possibility of unforeseen or unpredictable risks and/or possible complications that may result in a catastrophic outcome. The patient indicated having understood very clearly. We have given the patient no guarantees and we have made no promises. Enough time was given to the patient to ask questions, all of which were answered to the patient's satisfaction. Mr. Gribbins has indicated that he wanted to continue with the procedure. Attestation: I, the ordering provider, attest that I have discussed with the patient the benefits,  risks, side-effects, alternatives, likelihood of achieving goals, and potential problems during recovery for the procedure that I have provided informed consent. Date  Time: 03/30/2019 10:23 AM  Pre-Procedure Preparation:  Monitoring: As per clinic protocol. Respiration, ETCO2, SpO2, BP, heart rate and rhythm monitor placed and checked for adequate function Safety Precautions: Patient was assessed for positional comfort and pressure points before starting the procedure. Time-out: I initiated and conducted the "Time-out" before starting the procedure, as per protocol. The patient was asked to participate by confirming the accuracy of the "Time Out" information. Verification of the correct person, site, and procedure were performed and confirmed by me, the nursing staff, and the patient. "Time-out" conducted as per Joint Commission's Universal Protocol (UP.01.01.01). Time: 1104  Description of Procedure:          Target Area: Knee Joint Approach: Just above the Lateral tibial plateau, lateral to the infrapatellar tendon. Area Prepped: Entire knee area, from the mid-thigh to the mid-shin. Prepping solution: DuraPrep (Iodine Povacrylex [0.7% available iodine] and Isopropyl Alcohol, 74% w/w) Safety Precautions: Aspiration looking for blood return was conducted prior to all injections. At no point did we inject any substances, as a needle was being advanced. No attempts were made at seeking any paresthesias. Safe injection practices and needle disposal techniques used. Medications properly checked for expiration dates. SDV (single dose vial) medications used. Description of the Procedure: Protocol guidelines were followed. The patient was placed in position over the fluoroscopy table. The target area was identified and the area prepped in the usual manner. Skin & deeper tissues infiltrated with local anesthetic. Appropriate amount of time allowed to pass for local anesthetics to take effect. The procedure  needles were then advanced to the target area. Proper needle placement secured. Negative aspiration confirmed. Solution injected in intermittent fashion, asking for systemic symptoms every 0.5cc of injectate. The needles were then removed and the area cleansed, making sure to leave some of the prepping solution back to take advantage of its long term bactericidal properties. Vitals:   03/30/19 1022  BP: (!) 185/90  Pulse: 71  Resp: 18  Temp: 98.8 F (37.1 C)  TempSrc: Temporal  SpO2: 91%  Weight: 220 lb (99.8 kg)  Height: 5\' 9"  (1.753 m)    Start Time: 1104 hrs. End Time: 1107 hrs. Materials:  Needle(s) Type: Regular needle Gauge: 25G Length: 1.5-in Medication(s): Please see orders for medications and dosing details.  Imaging Guidance:          Type of Imaging Technique: None used Indication(s): N/A Exposure Time: No patient exposure Contrast: None used. Fluoroscopic Guidance: N/A Ultrasound Guidance: N/A Interpretation: N/A  Antibiotic Prophylaxis:   Anti-infectives (From admission, onward)   None     Indication(s): None identified  Post-operative Assessment:  Post-procedure Vital Signs:  Pulse/HCG Rate: 71  Temp: 98.8 F (37.1 C) Resp: 18 BP: (!) 185/90 SpO2: 91 %  EBL: None  Complications: No immediate post-treatment complications observed by team, or reported by patient.  Note: The patient tolerated the entire procedure well. A repeat set of vitals were taken after the procedure and the patient was kept under observation following institutional policy, for this type of procedure. Post-procedural neurological assessment was performed, showing return to baseline, prior to discharge. The patient was provided with post-procedure discharge instructions, including a section on how to identify potential problems. Should any problems arise concerning this procedure, the patient was given instructions to immediately contact us, at any time, without hesitation. In any  case, we plan to contact the patient by telephone for a follow-up status report regarding this interventional procedure.  Comments:  No additional relevant information.  Plan of Care  Orders:  Orders Placed This Encounter  Procedures  . KNEE INJECTION    Hyalgan knee injection to be done by MD.    Scheduling Instructions:     Procedure: Intra-articular Hyalgan Knee injection #1     Side(s): Bilateral Knee     Sedation: None     Timeframe: Today    Order Specific Question:   Where will this procedure be performed?    Answer:   [facility]  . KNEE INJECTION    Hyalgan knee injection. Please order Hyalgan.    Standing Status:   Future    Standing Expiration Date:   04/30/2019    Scheduling Instructions:     Procedure: Intra-articular Hyalgan Knee injection #2     Side: Bilateral     Sedation: None     Timeframe: in two (2) weeks    Order Specific Question:   Where will this procedure be performed?    Answer:   [facility]  . Informed Consent Details: Physician/Practitioner Attestation; Transcribe to consent form and obtain patient signature    Provider Attestation: I, [place] Dossie Arbour, MD, (Pain Management Specialist), the physician/practitioner, attest that I have discussed with the patient the benefits, risks, side effects, alternatives, likelihood of achieving goals and potential problems during recovery for the procedure that I have provided informed consent.    Scheduling Instructions:     Procedure: Therapeutic, bilateral, intra-articular Hyalgan knee injection     Indications: Chronic bilateral knee pain secondary to primary osteoarthritis of the knee     Note: Always confirm laterality of pain with Mr. Benda, before procedure.     Transcribe to consent form and obtain patient signature.  . Provide equipment / supplies at bedside    Equipment required: Single use, disposable, "Block Tray"    Standing Status:   Standing    Number of Occurrences:   1     Order Specific  Question:   Specify    Answer:   Block Tray   Chronic Opioid Analgesic:  Tramadol 50mg , 1 tab PO QD (50 mg/day) MME/day: 50mg /day.   Medications ordered for procedure: Meds ordered this encounter  Medications  . lidocaine (PF) (XYLOCAINE) 1 % injection 5 mL  . ropivacaine (PF) 2 mg/mL (0.2%) (NAROPIN) injection 5 mL  . Sodium Hyaluronate SOSY 2 mL  . Sodium Hyaluronate SOSY 2 mL   Medications administered: We administered lidocaine (PF), ropivacaine (PF) 2 mg/mL (0.2%), Sodium Hyaluronate, and Sodium Hyaluronate.  See the medical record for exact dosing, route, and time of administration.  Follow-up plan:   Return in about 2 weeks (around 04/13/2019) for Procedure (no sedation): (B) Hyalgan #2.       Interventional treatment options: Planned, scheduled, and/or pending:    Under consideration: Diagnostic left femoral + obturator NB Possible left femoral + obturator nerve RFA Diagnostic left L2-3 LESI Diagnostic left L3-4 LESI Diagnosticleft L4-5 LESI Diagnosticleft L2, L3 and L4 TFESI  Diagnostic right L4 and L5 TFESI  Possible bilateral lumbar facet RFA Diagnostic bilateral genicular NB Possible bilateral genicular nerve RFA   Therapeutic/palliative (PRN): Palliative bilateral lumbar facet block#4   Palliativeleft IA hip joint injection #3 Palliative bilateral IA knee injection (steroids) #2  Palliative bilateral IA Hyalgan knee injection #S3N2     Recent Visits Date Type Provider Dept  03/22/19 Telemedicine Milinda Pointer, MD [facility]  03/07/19 Procedure visit Milinda Pointer, MD [facility]  02/28/19 Telemedicine Milinda Pointer, MD [facility]  02/14/19 Procedure visit Milinda Pointer, MD [facility]  02/09/19 Telemedicine Milinda Pointer, MD [facility]  Showing recent visits within past 90 days and meeting all other requirements   Today's Visits Date Type  Provider Dept  03/30/19 Procedure visit Milinda Pointer, MD [facility]  Showing today's visits and meeting all other requirements   Future Appointments No visits were found meeting these conditions.  Showing future appointments within next 90 days and meeting all other requirements   Disposition: Discharge home  Discharge (Date  Time): 03/30/2019; 0110 hrs.   Primary Care Physician: Kirk Ruths, MD Location: [facility] Note by: Gaspar Cola, MD Date: 03/30/2019; Time: 11:30 AM  Disclaimer:  Medicine is not an Chief Strategy Officer. The only guarantee in medicine is that nothing is guaranteed. It is important to note that the decision to proceed with this intervention was based on the information collected from the patient. The Data and conclusions were drawn from the patient's questionnaire, the interview, and the physical examination. Because the information was provided in large part by the patient, it cannot be guaranteed that it has not been purposely or unconsciously manipulated. Every effort has been made to obtain as much relevant data as possible for this evaluation. It is important to note that the conclusions that lead to this procedure are derived in large part from the available data. Always take into account that the treatment will also be dependent on availability of resources and existing treatment guidelines, considered by other Pain Management Practitioners as being common knowledge and practice, at the time of the intervention. For Medico-Legal purposes, it is also important to point out that variation in procedural techniques and pharmacological choices are the acceptable norm. The indications, contraindications, technique, and results of the above procedure should only be interpreted and judged by a Board-Certified Interventional Pain Specialist with extensive familiarity and expertise in the same exact procedure and technique.

## 2019-03-31 ENCOUNTER — Telehealth: Payer: Self-pay

## 2019-03-31 NOTE — Telephone Encounter (Signed)
Post procedure phone call.  LM 

## 2019-04-13 ENCOUNTER — Ambulatory Visit: Payer: Medicare HMO | Attending: Pain Medicine | Admitting: Pain Medicine

## 2019-04-13 ENCOUNTER — Encounter: Payer: Self-pay | Admitting: Pain Medicine

## 2019-04-13 ENCOUNTER — Other Ambulatory Visit: Payer: Self-pay

## 2019-04-13 VITALS — BP 124/71 | HR 68 | Temp 98.2°F | Resp 18 | Ht 69.0 in | Wt 220.0 lb

## 2019-04-13 DIAGNOSIS — M25561 Pain in right knee: Secondary | ICD-10-CM | POA: Insufficient documentation

## 2019-04-13 DIAGNOSIS — M170 Bilateral primary osteoarthritis of knee: Secondary | ICD-10-CM | POA: Diagnosis not present

## 2019-04-13 DIAGNOSIS — G8929 Other chronic pain: Secondary | ICD-10-CM | POA: Diagnosis not present

## 2019-04-13 DIAGNOSIS — M25562 Pain in left knee: Secondary | ICD-10-CM

## 2019-04-13 MED ORDER — SODIUM HYALURONATE (VISCOSUP) 20 MG/2ML IX SOSY: 2.0000 mL | PREFILLED_SYRINGE | Freq: Once | INTRA_ARTICULAR | Status: AC

## 2019-04-13 MED ORDER — LIDOCAINE HCL (PF) 1 % IJ SOLN
5.0000 mL | Freq: Once | INTRAMUSCULAR | Status: AC
  Administered 2019-04-13: 13:00:00 5 mL

## 2019-04-13 MED ORDER — SODIUM HYALURONATE (VISCOSUP) 20 MG/2ML IX SOSY
2.0000 mL | PREFILLED_SYRINGE | Freq: Once | INTRA_ARTICULAR | Status: AC
  Administered 2019-04-13: 2 mL via INTRA_ARTICULAR

## 2019-04-13 MED ORDER — LIDOCAINE HCL (PF) 1 % IJ SOLN
INTRAMUSCULAR | Status: AC
  Filled 2019-04-13: qty 5

## 2019-04-13 MED ORDER — ROPIVACAINE HCL 2 MG/ML IJ SOLN
INTRAMUSCULAR | Status: AC
  Filled 2019-04-13: qty 10

## 2019-04-13 MED ORDER — ROPIVACAINE HCL 2 MG/ML IJ SOLN
5.0000 mL | Freq: Once | INTRAMUSCULAR | Status: AC
  Administered 2019-04-13: 13:00:00 5 mL via INTRA_ARTICULAR

## 2019-04-13 NOTE — Progress Notes (Signed)
PROVIDER NOTE: Information contained herein reflects review and annotations entered in association with encounter. Interpretation of such information and data should be left to medically-trained personnel. Information provided to patient can be located elsewhere in the medical record under "Patient Instructions". Document created using STT-dictation technology, any transcriptional errors that may result from process are unintentional.    Patient: Joshua Humphrey  Service Category: Procedure  Provider: Gaspar Cola, MD  DOB: Mar 01, 1930  DOS: 04/13/2019  Location: [facility]  MRN: [id number]  Setting: Ambulatory - outpatient  Referring Provider: Milinda Pointer, MD  Type: Established Patient  Specialty: Interventional Pain Management  PCP: Joshua Ruths, MD   Primary Reason for Visit: Interventional Pain Management Treatment. CC: Knee Pain (bilateral, right worse)  Procedure:          Anesthesia, Analgesia, Anxiolysis:  Type: Therapeutic Intra-Articular Hyalgan Knee Injection #S3N2  Region: Lateral infrapatellar Knee Region Level: Knee Joint Laterality: Bilateral  Type: Local Anesthesia Indication(s): Analgesia         Local Anesthetic: Lidocaine 1-2% Route: Infiltration ([place]/IM) IV Access: Declined Sedation: Declined   Position: Sitting   Indications: 1. Chronic knee pain (Bilateral) (L>R)   2. Osteoarthritis of knee (Bilateral)    Pain Score: Pre-procedure: 5 (right knee 5, left knee 1)/10 Post-procedure: 0-No pain/10   Post-Procedure Evaluation  Procedure (03/30/2019): Therapeutic bilateral intra-articular Hyalgan knee injection #1, no fluoroscopy or IV sedation Pre-procedure pain level:  4/10 Post-procedure: 4/10 No initial benefit, possibly due to rapid discharge after no sedation procedure, without enough time to allow full onset of block.  Sedation: None.  Joshua Shorter, RN  04/13/2019 12:43 PM  Sign when Signing Visit Pain relief after procedure  (treated area only): (Questions asked to patient) 1. Starting about 15 minutes after the procedure, and "while the area was still numb" (from the local anesthetics), were you having any of your usual pain "in that area" (the treated area)?  (NOTE: NOT including the discomfort from the needle sticks.) First 1 hour: 100% better. First 4-6 hours:100 % better. 2. How long did the numbness from the local anesthetics last? (More than 6 hours?) Duration: 6 hours.  3. How much better is your pain now, when compared to before the procedure? Current benefit: 75 % better.  Right side lasting 1 week, 95% left lasting longer 4. Can you move better now? Improvement in ROM (Range of Motion): Yes. 5. Can you do more now? Improvement in function: Yes. 4. Did you have any problems with the procedure? Side-effects/Complications: No.  Joshua Shorter, RN  04/13/2019 12:23 PM  Signed Safety precautions to be maintained throughout the outpatient stay will include: orient to surroundings, keep bed in low position, maintain call bell within reach at all times, provide assistance with transfer out of bed and ambulation.  Thomes, Corpe, RN  04/13/2019 12:17 PM  Sign when Signing Visit Safety precautions to be maintained throughout the outpatient stay will include: orient to surroundings, keep bed in low position, maintain call bell within reach at all times, provide assistance with transfer out of bed and ambulation.   Current benefits: Defined as benefit that persist at this time.   Analgesia:  >75% relief.  Near complete relief of the pain on the left knee.  On the right knee he is still having some pain but it is improving. Function: Joshua Humphrey reports improvement in function ROM: Joshua Humphrey reports improvement in ROM  Pre-op Assessment:  Joshua Humphrey is a 86  y.o. (year old), male patient, seen today for interventional treatment. He  has a past surgical history that includes tumor removed (2000); Prostate surgery (2002);  lipoma removal (2000); Tonsillectomy; Cataract extraction, bilateral; Toe Surgery; Olecranon bursectomy (Left, 01/21/2016); Elbow surgery; and Eye surgery. Joshua Humphrey has a current medication list which includes the following prescription(s): acetaminophen, albuterol, aspirin ec, atorvastatin, b-d ultrafine iii short pen, benzonatate, calcium carbonate-vitamin d, carvedilol, cetirizine, ferrous fumarate, flaxseed (linseed), fluticasone, furosemide, gabapentin, glucosamine-chondroitin, hydralazine, ipratropium-albuterol, levemir flextouch, losartan, magnesium oxide, metformin, mometasone-formoterol, montelukast, ocuvite adult 50+, nifedipine, omeprazole, potassium chloride sa, pramipexole, sitagliptin, tramadol, and torsemide. His primarily concern today is the Knee Pain (bilateral, right worse)  Initial Vital Signs:  Pulse/HCG Rate: 70  Temp: 98.2 F (36.8 C) Resp: 20 BP: 139/75 SpO2: 90 %  BMI: Estimated body mass index is 32.49 kg/m as calculated from the following:   Height as of this encounter: 5\' 9"  (1.753 m).   Weight as of this encounter: 220 lb (99.8 kg).  Risk Assessment: Allergies: Reviewed. He has No Known Allergies.  Allergy Precautions: None required Coagulopathies: Reviewed. None identified.  Blood-thinner therapy: None at this time Active Infection(s): Reviewed. None identified. Joshua Humphrey is afebrile  Site Confirmation: Joshua Humphrey was asked to confirm the procedure and laterality before marking the site Procedure checklist: Completed Consent: Before the procedure and under the influence of no sedative(s), amnesic(s), or anxiolytics, the patient was informed of the treatment options, risks and possible complications. To fulfill our ethical and legal obligations, as recommended by the American Medical Association's Code of Ethics, I have informed the patient of my clinical impression; the nature and purpose of the treatment or procedure; the risks, benefits, and possible  complications of the intervention; the alternatives, including doing nothing; the risk(s) and benefit(s) of the alternative treatment(s) or procedure(s); and the risk(s) and benefit(s) of doing nothing. The patient was provided information about the general risks and possible complications associated with the procedure. These may include, but are not limited to: failure to achieve desired goals, infection, bleeding, organ or nerve damage, allergic reactions, paralysis, and death. In addition, the patient was informed of those risks and complications associated to the procedure, such as failure to decrease pain; infection; bleeding; organ or nerve damage with subsequent damage to sensory, motor, and/or autonomic systems, resulting in permanent pain, numbness, and/or weakness of one or several areas of the body; allergic reactions; (i.e.: anaphylactic reaction); and/or death. Furthermore, the patient was informed of those risks and complications associated with the medications. These include, but are not limited to: allergic reactions (i.e.: anaphylactic or anaphylactoid reaction(s)); adrenal axis suppression; blood sugar elevation that in diabetics may result in ketoacidosis or comma; water retention that in patients with history of congestive heart failure may result in shortness of breath, pulmonary edema, and decompensation with resultant heart failure; weight gain; swelling or edema; medication-induced neural toxicity; particulate matter embolism and blood vessel occlusion with resultant organ, and/or nervous system infarction; and/or aseptic necrosis of one or more joints. Finally, the patient was informed that Medicine is not an exact science; therefore, there is also the possibility of unforeseen or unpredictable risks and/or possible complications that may result in a catastrophic outcome. The patient indicated having understood very clearly. We have given the patient no guarantees and we have made no  promises. Enough time was given to the patient to ask questions, all of which were answered to the patient's satisfaction. Mr. Morenz has indicated that he wanted to continue with the  procedure. Attestation: I, the ordering provider, attest that I have discussed with the patient the benefits, risks, side-effects, alternatives, likelihood of achieving goals, and potential problems during recovery for the procedure that I have provided informed consent. Date  Time: 04/13/2019 12:18 PM  Pre-Procedure Preparation:  Monitoring: As per clinic protocol. Respiration, ETCO2, SpO2, BP, heart rate and rhythm monitor placed and checked for adequate function Safety Precautions: Patient was assessed for positional comfort and pressure points before starting the procedure. Time-out: I initiated and conducted the "Time-out" before starting the procedure, as per protocol. The patient was asked to participate by confirming the accuracy of the "Time Out" information. Verification of the correct person, site, and procedure were performed and confirmed by me, the nursing staff, and the patient. "Time-out" conducted as per Joint Commission's Universal Protocol (UP.01.01.01). Time: 1244  Description of Procedure:          Target Area: Knee Joint Approach: Just above the Lateral tibial plateau, lateral to the infrapatellar tendon. Area Prepped: Entire knee area, from the mid-thigh to the mid-shin. Prepping solution: DuraPrep (Iodine Povacrylex [0.7% available iodine] and Isopropyl Alcohol, 74% w/w) Safety Precautions: Aspiration looking for blood return was conducted prior to all injections. At no point did we inject any substances, as a needle was being advanced. No attempts were made at seeking any paresthesias. Safe injection practices and needle disposal techniques used. Medications properly checked for expiration dates. SDV (single dose vial) medications used. Description of the Procedure: Protocol guidelines were  followed. The patient was placed in position over the fluoroscopy table. The target area was identified and the area prepped in the usual manner. Skin & deeper tissues infiltrated with local anesthetic. Appropriate amount of time allowed to pass for local anesthetics to take effect. The procedure needles were then advanced to the target area. Proper needle placement secured. Negative aspiration confirmed. Solution injected in intermittent fashion, asking for systemic symptoms every 0.5cc of injectate. The needles were then removed and the area cleansed, making sure to leave some of the prepping solution back to take advantage of its long term bactericidal properties. Vitals:   04/13/19 1218 04/13/19 1247  BP: 139/75 124/71  Pulse: 70 68  Resp: 20 18  Temp: 98.2 F (36.8 C)   SpO2: 90% 90%  Weight: 220 lb (99.8 kg)   Height: 5\' 9"  (1.753 m)     Start Time: 1244 hrs. End Time: 1247 hrs. Materials:  Needle(s) Type: Regular needle Gauge: 25G Length: 1.5-in Medication(s): Please see orders for medications and dosing details.  Imaging Guidance:          Type of Imaging Technique: None used Indication(s): N/A Exposure Time: No patient exposure Contrast: None used. Fluoroscopic Guidance: N/A Ultrasound Guidance: N/A Interpretation: N/A  Antibiotic Prophylaxis:   Anti-infectives (From admission, onward)   None     Indication(s): None identified  Post-operative Assessment:  Post-procedure Vital Signs:  Pulse/HCG Rate: 68  Temp: 98.2 F (36.8 C) Resp: 18 BP: 124/71 SpO2: 90 %  EBL: None  Complications: No immediate post-treatment complications observed by team, or reported by patient.  Note: The patient tolerated the entire procedure well. A repeat set of vitals were taken after the procedure and the patient was kept under observation following institutional policy, for this type of procedure. Post-procedural neurological assessment was performed, showing return to baseline,  prior to discharge. The patient was provided with post-procedure discharge instructions, including a section on how to identify potential problems. Should any problems arise concerning this  procedure, the patient was given instructions to immediately contact us, at any time, without hesitation. In any case, we plan to contact the patient by telephone for a follow-up status report regarding this interventional procedure.  Comments:  No additional relevant information.  Plan of Care  Orders:  Orders Placed This Encounter  Procedures  . KNEE INJECTION    Hyalgan knee injection to be done by MD.    Scheduling Instructions:     Procedure: Intra-articular Hyalgan Knee injection #2     Side(s): Bilateral Knee     Sedation: None     Timeframe: Today    Order Specific Question:   Where will this procedure be performed?    Answer:   [facility]  . KNEE INJECTION    Hyalgan knee injection. Please order Hyalgan.    Standing Status:   Future    Standing Expiration Date:   05/11/2019    Scheduling Instructions:     Procedure: Intra-articular Hyalgan Knee injection #3     Side: Bilateral     Sedation: None     Timeframe: in two (2) weeks    Order Specific Question:   Where will this procedure be performed?    Answer:   [facility]  . Informed Consent Details: Physician/Practitioner Attestation; Transcribe to consent form and obtain patient signature    Provider Attestation: I, Marion Dossie Arbour, MD, (Pain Management Specialist), the physician/practitioner, attest that I have discussed with the patient the benefits, risks, side effects, alternatives, likelihood of achieving goals and potential problems during recovery for the procedure that I have provided informed consent.    Scheduling Instructions:     Procedure: Therapeutic, bilateral, intra-articular Hyalgan knee injection     Indications: Chronic bilateral knee pain secondary to primary osteoarthritis of the knee     Note:  Always confirm laterality of pain with Mr. Stmarie, before procedure.     Transcribe to consent form and obtain patient signature.  . Provide equipment / supplies at bedside    Equipment required: Single use, disposable, "Block Tray"    Standing Status:   Standing    Number of Occurrences:   1    Order Specific Question:   Specify    Answer:   Block Tray   Chronic Opioid Analgesic:  Tramadol 50mg , 1 tab PO QD (50 mg/day) MME/day: 50mg /day.   Medications ordered for procedure: Meds ordered this encounter  Medications  . lidocaine (PF) (XYLOCAINE) 1 % injection 5 mL  . ropivacaine (PF) 2 mg/mL (0.2%) (NAROPIN) injection 5 mL  . Sodium Hyaluronate SOSY 2 mL  . Sodium Hyaluronate SOSY 2 mL   Medications administered: We administered lidocaine (PF), ropivacaine (PF) 2 mg/mL (0.2%), Sodium Hyaluronate, and Sodium Hyaluronate.  See the medical record for exact dosing, route, and time of administration.  Follow-up plan:   Return in about 2 weeks (around 04/27/2019) for Procedure (no sedation): (B) Hyalgan #3.       Interventional treatment options: Planned, scheduled, and/or pending:    Under consideration: Diagnostic left femoral + obturator NB Possible left femoral + obturator nerve RFA Diagnostic left L2-3 LESI Diagnostic left L3-4 LESI Diagnosticleft L4-5 LESI Diagnosticleft L2, L3 and L4 TFESI  Diagnostic right L4 and L5 TFESI  Possible bilateral lumbar facet RFA Diagnostic bilateral genicular NB Possible bilateral genicular nerve RFA   Therapeutic/palliative (PRN): Palliative bilateral lumbar facet block#4   Palliativeleft IA hip joint injection #3 Palliative bilateral IA knee injection (steroids) #2  Palliative bilateral  IA Hyalgan knee injection #S3N3     Recent Visits Date Type Provider Dept  03/30/19 Procedure visit Joshua Pointer, MD [facility]  03/22/19 Telemedicine Joshua Pointer, MD [facility]  03/07/19 Procedure  visit Joshua Pointer, MD [facility]  02/28/19 Telemedicine Joshua Humphrey, [facility]  02/14/19 Procedure visit Joshua Pointer, MD [facility]  02/09/19 Telemedicine Joshua Pointer, MD [facility]  Showing recent visits within past 90 days and meeting all other requirements   Today's Visits Date Type Provider Dept  04/13/19 Procedure visit Joshua Pointer, MD [facility]  Showing today's visits and meeting all other requirements   Future Appointments Date Type Provider Dept  04/27/19 Appointment Joshua Pointer, MD [facility]  Showing future appointments within next 90 days and meeting all other requirements   Disposition: Discharge home  Discharge (Date  Time): 04/13/2019; 1248 hrs.   Primary Care Physician: Joshua Ruths, MD Location: [facility] Note by: Joshua Cola, MD Date: 04/13/2019; Time: 1:05 PM  Disclaimer:  Medicine is not an Chief Strategy Officer. The only guarantee in medicine is that nothing is guaranteed. It is important to note that the decision to proceed with this intervention was based on the information collected from the patient. The Data and conclusions were drawn from the patient's questionnaire, the interview, and the physical examination. Because the information was provided in large part by the patient, it cannot be guaranteed that it has not been purposely or unconsciously manipulated. Every effort has been made to obtain as much relevant data as possible for this evaluation. It is important to note that the conclusions that lead to this procedure are derived in large part from the available data. Always take into account that the treatment will also be dependent on availability of resources and existing treatment guidelines, considered by other Pain Management Practitioners as being common knowledge and practice, at the time of the intervention.  For Medico-Legal purposes, it is also important to point out that variation in procedural techniques and pharmacological choices are the acceptable norm. The indications, contraindications, technique, and results of the above procedure should only be interpreted and judged by a Board-Certified Interventional Pain Specialist with extensive familiarity and expertise in the same exact procedure and technique.

## 2019-04-13 NOTE — Progress Notes (Signed)
Safety precautions to be maintained throughout the outpatient stay will include: orient to surroundings, keep bed in low position, maintain call bell within reach at all times, provide assistance with transfer out of bed and ambulation.  

## 2019-04-13 NOTE — Progress Notes (Signed)
Pain relief after procedure (treated area only): (Questions asked to patient) 1. Starting about 15 minutes after the procedure, and "while the area was still numb" (from the local anesthetics), were you having any of your usual pain "in that area" (the treated area)?  (NOTE: NOT including the discomfort from the needle sticks.) First 1 hour: 100% better. First 4-6 hours:100 % better. 3. How much better is your pain now, when compared to before the procedure? Current benefit: 75 % better.  Right side lasting 1 week, 95% left lasting longer 4. Can you move better now? Improvement in ROM (Range of Motion): Yes. 5. Can you do more now? Improvement in function: Yes. 4. Did you have any problems with the procedure? Side-effects/Complications: No.

## 2019-04-13 NOTE — Patient Instructions (Signed)

## 2019-04-14 ENCOUNTER — Telehealth: Payer: Self-pay | Admitting: *Deleted

## 2019-04-14 NOTE — Telephone Encounter (Signed)
No problems post procedure. 

## 2019-04-27 ENCOUNTER — Ambulatory Visit: Payer: Medicare HMO | Admitting: Pain Medicine

## 2019-05-02 ENCOUNTER — Other Ambulatory Visit: Payer: Self-pay

## 2019-05-02 ENCOUNTER — Encounter: Payer: Self-pay | Admitting: Pain Medicine

## 2019-05-02 ENCOUNTER — Ambulatory Visit: Payer: Medicare HMO | Attending: Pain Medicine | Admitting: Pain Medicine

## 2019-05-02 VITALS — BP 141/76 | HR 62 | Temp 98.1°F | Resp 18 | Ht 69.0 in | Wt 220.0 lb

## 2019-05-02 DIAGNOSIS — M25561 Pain in right knee: Secondary | ICD-10-CM | POA: Diagnosis not present

## 2019-05-02 DIAGNOSIS — G8929 Other chronic pain: Secondary | ICD-10-CM | POA: Insufficient documentation

## 2019-05-02 DIAGNOSIS — M170 Bilateral primary osteoarthritis of knee: Secondary | ICD-10-CM | POA: Diagnosis not present

## 2019-05-02 DIAGNOSIS — M25562 Pain in left knee: Secondary | ICD-10-CM | POA: Insufficient documentation

## 2019-05-02 MED ORDER — LIDOCAINE HCL (PF) 1 % IJ SOLN
5.0000 mL | Freq: Once | INTRAMUSCULAR | Status: AC
  Administered 2019-05-02: 5 mL

## 2019-05-02 MED ORDER — ROPIVACAINE HCL 2 MG/ML IJ SOLN
5.0000 mL | Freq: Once | INTRAMUSCULAR | Status: AC
  Administered 2019-05-02: 5 mL via INTRA_ARTICULAR

## 2019-05-02 MED ORDER — ROPIVACAINE HCL 2 MG/ML IJ SOLN
INTRAMUSCULAR | Status: AC
  Filled 2019-05-02: qty 10

## 2019-05-02 MED ORDER — LIDOCAINE HCL (PF) 1 % IJ SOLN
INTRAMUSCULAR | Status: AC
  Filled 2019-05-02: qty 5

## 2019-05-02 MED ORDER — SODIUM HYALURONATE (VISCOSUP) 20 MG/2ML IX SOSY
2.0000 mL | PREFILLED_SYRINGE | Freq: Once | INTRA_ARTICULAR | Status: AC
  Administered 2019-05-02: 2 mL via INTRA_ARTICULAR

## 2019-05-02 NOTE — Patient Instructions (Signed)
____________________________________________________________________________________________  Post-Procedure Discharge Instructions  Instructions:  Apply ice:   Purpose: This will minimize any swelling and discomfort after procedure.   When: Day of procedure, as soon as you get home.  How: Fill a plastic sandwich bag with crushed ice. Cover it with a small towel and apply to injection site.  How long: (15 min on, 15 min off) Apply for 15 minutes then remove x 15 minutes.  Repeat sequence on day of procedure, until you go to bed.  Apply heat:   Purpose: To treat any soreness and discomfort from the procedure.  When: Starting the next day after the procedure.  How: Apply heat to procedure site starting the day following the procedure.  How long: May continue to repeat daily, until discomfort goes away.  Food intake: Start with clear liquids (like water) and advance to regular food, as tolerated.   Physical activities: Keep activities to a minimum for the first 8 hours after the procedure. After that, then as tolerated.  Driving: If you have received any sedation, be responsible and do not drive. You are not allowed to drive for 24 hours after having sedation.  Blood thinner: (Applies only to those taking blood thinners) You may restart your blood thinner 6 hours after your procedure.  Insulin: (Applies only to Diabetic patients taking insulin) As soon as you can eat, you may resume your normal dosing schedule.  Infection prevention: Keep procedure site clean and dry. Shower daily and clean area with soap and water.  Post-procedure Pain Diary: Extremely important that this be done correctly and accurately. Recorded information will be used to determine the next step in treatment. For the purpose of accuracy, follow these rules:  Evaluate only the area treated. Do not report or include pain from an untreated area. For the purpose of this evaluation, ignore all other areas of pain,  except for the treated area.  After your procedure, avoid taking a long nap and attempting to complete the pain diary after you wake up. Instead, set your alarm clock to go off every hour, on the hour, for the initial 8 hours after the procedure. Document the duration of the numbing medicine, and the relief you are getting from it.  Do not go to sleep and attempt to complete it later. It will not be accurate. If you received sedation, it is likely that you were given a medication that may cause amnesia. Because of this, completing the diary at a later time may cause the information to be inaccurate. This information is needed to plan your care.  Follow-up appointment: Keep your post-procedure follow-up evaluation appointment after the procedure (usually 2 weeks for most procedures, 6 weeks for radiofrequencies). DO NOT FORGET to bring you pain diary with you.   Expect: (What should I expect to see with my procedure?)  From numbing medicine (AKA: Local Anesthetics): Numbness or decrease in pain. You may also experience some weakness, which if present, could last for the duration of the local anesthetic.  Onset: Full effect within 15 minutes of injected.  Duration: It will depend on the type of local anesthetic used. On the average, 1 to 8 hours.   From steroids (Applies only if steroids were used): Decrease in swelling or inflammation. Once inflammation is improved, relief of the pain will follow.  Onset of benefits: Depends on the amount of swelling present. The more swelling, the longer it will take for the benefits to be seen. In some cases, up to 10 days.    Duration: Steroids will stay in the system x 2 weeks. Duration of benefits will depend on multiple posibilities including persistent irritating factors.  Side-effects: If present, they may typically last 2 weeks (the duration of the steroids).  Frequent: Cramps (if they occur, drink Gatorade and take over-the-counter Magnesium 450-500 mg  once to twice a day); water retention with temporary weight gain; increases in blood sugar; decreased immune system response; increased appetite.  Occasional: Facial flushing (red, warm cheeks); mood swings; menstrual changes.  Uncommon: Long-term decrease or suppression of natural hormones; bone thinning. (These are more common with higher doses or more frequent use. This is why we prefer that our patients avoid having any injection therapies in other practices.)   Very Rare: Severe mood changes; psychosis; aseptic necrosis.  From procedure: Some discomfort is to be expected once the numbing medicine wears off. This should be minimal if ice and heat are applied as instructed.  Call if: (When should I call?)  You experience numbness and weakness that gets worse with time, as opposed to wearing off.  New onset bowel or bladder incontinence. (Applies only to procedures done in the spine)  Emergency Numbers:  Durning business hours (Monday - Thursday, 8:00 AM - 4:00 PM) (Friday, 9:00 AM - 12:00 Noon): [phone]  After hours: [phone]  NOTE: If you are having a problem and are unable connect with, or to talk to a provider, then go to your nearest urgent care or emergency department. If the problem is serious and urgent, please call 911. ____________________________________________________________________________________________   ____________________________________________________________________________________________  Preparing for your procedure (without sedation)  Procedure appointments are limited to planned procedures: . No Prescription Refills. . No disability issues will be discussed. . No medication changes will be discussed.  Instructions: . Oral Intake: Do not eat or drink anything for at least 3 hours prior to your procedure. . Transportation: Unless otherwise stated by your physician, you may drive yourself after the procedure. . Blood Pressure Medicine:  Take your blood pressure medicine with a sip of water the morning of the procedure. . Blood thinners: Notify our staff if you are taking any blood thinners. Depending on which one you take, there will be specific instructions on how and when to stop it. . Diabetics on insulin: Notify the staff so that you can be scheduled 1st case in the morning. If your diabetes requires high dose insulin, take only  of your normal insulin dose the morning of the procedure and notify the staff that you have done so. . Preventing infections: Shower with an antibacterial soap the morning of your procedure.  . Build-up your immune system: Take 1000 mg of Vitamin C with every meal (3 times a day) the day prior to your procedure. . Antibiotics: Inform the staff if you have a condition or reason that requires you to take antibiotics before dental procedures. . Pregnancy: If you are pregnant, call and cancel the procedure. . Sickness: If you have a cold, fever, or any active infections, call and cancel the procedure. . Arrival: You must be in the facility at least 30 minutes prior to your scheduled procedure. . Children: Do not bring any children with you. . Dress appropriately: Bring dark clothing that you would not mind if they get stained. . Valuables: Do not bring any jewelry or valuables.  Reasons to call and reschedule or cancel your procedure: (Following these recommendations will minimize the risk of a serious complication.) . Surgeries: Avoid having procedures within 2 weeks   of any surgery. (Avoid for 2 weeks before or after any surgery). . Flu Shots: Avoid having procedures within 2 weeks of a flu shots or . (Avoid for 2 weeks before or after immunizations). . Barium: Avoid having a procedure within 7-10 days after having had a radiological study involving the use of radiological contrast. (Myelograms, Barium swallow or enema study). . Heart attacks: Avoid any elective procedures or surgeries for the initial 6  months after a "Myocardial Infarction" (Heart Attack). . Blood thinners: It is imperative that you stop these medications before procedures. Let us know if you if you take any blood thinner.  . Infection: Avoid procedures during or within two weeks of an infection (including chest colds or gastrointestinal problems). Symptoms associated with infections include: Localized redness, fever, chills, night sweats or profuse sweating, burning sensation when voiding, cough, congestion, stuffiness, runny nose, sore throat, diarrhea, nausea, vomiting, cold or Flu symptoms, recent or current infections. It is specially important if the infection is over the area that we intend to treat. . Heart and lung problems: Symptoms that may suggest an active cardiopulmonary problem include: cough, chest pain, breathing difficulties or shortness of breath, dizziness, ankle swelling, uncontrolled high or unusually low blood pressure, and/or palpitations. If you are experiencing any of these symptoms, cancel your procedure and contact your primary care physician for an evaluation.  Remember:  Regular Business hours are:  Monday to Thursday 8:00 AM to 4:00 PM  Provider's Schedule: Kiaja Shorty, MD:  Procedure days: Tuesday and Thursday 7:30 AM to 4:00 PM  Bilal Lateef, MD:  Procedure days: Monday and Wednesday 7:30 AM to 4:00 PM ____________________________________________________________________________________________     

## 2019-05-02 NOTE — Progress Notes (Signed)
PROVIDER NOTE: Information contained herein reflects review and annotations entered in association with encounter. Interpretation of such information and data should be left to medically-trained personnel. Information provided to patient can be located elsewhere in the medical record under "Patient Instructions". Document created using STT-dictation technology, any transcriptional errors that may result from process are unintentional.    Patient: Joshua Humphrey  Service Category: Procedure  Provider: Gaspar Cola, MD  DOB: 1929-05-21  DOS: 05/02/2019  Location: [facility]  MRN: [id number]  Setting: Ambulatory - outpatient  Referring Provider: Kirk Ruths, MD  Type: Established Patient  Specialty: Interventional Pain Management  PCP: Kirk Ruths, MD   Primary Reason for Visit: Interventional Pain Management Treatment. CC: Knee Pain (bilateral- right worse5/10)  Procedure:          Anesthesia, Analgesia, Anxiolysis:  Type: Therapeutic Intra-Articular Hyalgan Knee Injection #3  Region: Lateral infrapatellar Knee Region Level: Knee Joint Laterality: Bilateral  Type: Local Anesthesia Indication(s): Analgesia         Local Anesthetic: Lidocaine 1-2% Route: Infiltration ([place]/IM) IV Access: Declined Sedation: Declined   Position: Sitting   Indications: 1. Osteoarthritis of knee (Bilateral)   2. Chronic knee pain (Bilateral) (L>R)    Pain Score: Pre-procedure: 5 /10 Post-procedure: 0-No pain/10   Pre-op Assessment:  Joshua Humphrey is a 86 y.o. (year old), male patient, seen today for interventional treatment. He  has a past surgical history that includes tumor removed (2000); Prostate surgery (2002); lipoma removal (2000); Tonsillectomy; Cataract extraction, bilateral; Toe Surgery; Olecranon bursectomy (Left, 01/21/2016); Elbow surgery; and Eye surgery. Joshua Humphrey has a current medication list which includes the following prescription(s): acetaminophen,  albuterol, aspirin ec, atorvastatin, b-d ultrafine iii short pen, benzonatate, calcium carbonate-vitamin d, carvedilol, cetirizine, ferrous fumarate, flaxseed (linseed), fluticasone, furosemide, gabapentin, glucosamine-chondroitin, hydralazine, ipratropium-albuterol, levemir flextouch, losartan, magnesium oxide, metformin, mometasone-formoterol, montelukast, ocuvite adult 50+, nifedipine, omeprazole, potassium chloride sa, pramipexole, sitagliptin, tramadol, and torsemide. His primarily concern today is the Knee Pain (bilateral- right worse5/10)  Initial Vital Signs:  Pulse/HCG Rate: 63  Temp: 98.1 F (36.7 C) Resp: 16 BP: 140/77 SpO2: 90 %  BMI: Estimated body mass index is 32.49 kg/m as calculated from the following:   Height as of this encounter: 5\' 9"  (1.753 m).   Weight as of this encounter: 220 lb (99.8 kg).  Risk Assessment: Allergies: Reviewed. He has No Known Allergies.  Allergy Precautions: None required Coagulopathies: Reviewed. None identified.  Blood-thinner therapy: None at this time Active Infection(s): Reviewed. None identified. Joshua Humphrey is afebrile  Site Confirmation: Joshua Humphrey was asked to confirm the procedure and laterality before marking the site Procedure checklist: Completed Consent: Before the procedure and under the influence of no sedative(s), amnesic(s), or anxiolytics, the patient was informed of the treatment options, risks and possible complications. To fulfill our ethical and legal obligations, as recommended by the American Medical Association's Code of Ethics, I have informed the patient of my clinical impression; the nature and purpose of the treatment or procedure; the risks, benefits, and possible complications of the intervention; the alternatives, including doing nothing; the risk(s) and benefit(s) of the alternative treatment(s) or procedure(s); and the risk(s) and benefit(s) of doing nothing. The patient was provided information about the general  risks and possible complications associated with the procedure. These may include, but are not limited to: failure to achieve desired goals, infection, bleeding, organ or nerve damage, allergic reactions, paralysis, and death. In addition, the patient was informed of those risks  and complications associated to the procedure, such as failure to decrease pain; infection; bleeding; organ or nerve damage with subsequent damage to sensory, motor, and/or autonomic systems, resulting in permanent pain, numbness, and/or weakness of one or several areas of the body; allergic reactions; (i.e.: anaphylactic reaction); and/or death. Furthermore, the patient was informed of those risks and complications associated with the medications. These include, but are not limited to: allergic reactions (i.e.: anaphylactic or anaphylactoid reaction(s)); adrenal axis suppression; blood sugar elevation that in diabetics may result in ketoacidosis or comma; water retention that in patients with history of congestive heart failure may result in shortness of breath, pulmonary edema, and decompensation with resultant heart failure; weight gain; swelling or edema; medication-induced neural toxicity; particulate matter embolism and blood vessel occlusion with resultant organ, and/or nervous system infarction; and/or aseptic necrosis of one or more joints. Finally, the patient was informed that Medicine is not an exact science; therefore, there is also the possibility of unforeseen or unpredictable risks and/or possible complications that may result in a catastrophic outcome. The patient indicated having understood very clearly. We have given the patient no guarantees and we have made no promises. Enough time was given to the patient to ask questions, all of which were answered to the patient's satisfaction. Joshua Humphrey has indicated that he wanted to continue with the procedure. Attestation: I, the ordering provider, attest that I have discussed  with the patient the benefits, risks, side-effects, alternatives, likelihood of achieving goals, and potential problems during recovery for the procedure that I have provided informed consent. Date  Time: 05/02/2019  9:59 AM  Pre-Procedure Preparation:  Monitoring: As per clinic protocol. Respiration, ETCO2, SpO2, BP, heart rate and rhythm monitor placed and checked for adequate function Safety Precautions: Patient was assessed for positional comfort and pressure points before starting the procedure. Time-out: I initiated and conducted the "Time-out" before starting the procedure, as per protocol. The patient was asked to participate by confirming the accuracy of the "Time Out" information. Verification of the correct person, site, and procedure were performed and confirmed by me, the nursing staff, and the patient. "Time-out" conducted as per Joint Commission's Universal Protocol (UP.01.01.01). Time: 1020  Description of Procedure:          Target Area: Knee Joint Approach: Just above the Lateral tibial plateau, lateral to the infrapatellar tendon. Area Prepped: Entire knee area, from the mid-thigh to the mid-shin. Prepping solution: DuraPrep (Iodine Povacrylex [0.7% available iodine] and Isopropyl Alcohol, 74% w/w) Safety Precautions: Aspiration looking for blood return was conducted prior to all injections. At no point did we inject any substances, as a needle was being advanced. No attempts were made at seeking any paresthesias. Safe injection practices and needle disposal techniques used. Medications properly checked for expiration dates. SDV (single dose vial) medications used. Description of the Procedure: Protocol guidelines were followed. The patient was placed in position over the fluoroscopy table. The target area was identified and the area prepped in the usual manner. Skin & deeper tissues infiltrated with local anesthetic. Appropriate amount of time allowed to pass for local anesthetics  to take effect. The procedure needles were then advanced to the target area. Proper needle placement secured. Negative aspiration confirmed. Solution injected in intermittent fashion, asking for systemic symptoms every 0.5cc of injectate. The needles were then removed and the area cleansed, making sure to leave some of the prepping solution back to take advantage of its long term bactericidal properties. Vitals:   05/02/19 0958 05/02/19 1029  BP: 140/77 (!) 141/76  Pulse: 63 62  Resp: 16 18  Temp: 98.1 F (36.7 C)   SpO2: 90%   Weight: 220 lb (99.8 kg)   Height: 5\' 9"  (1.753 m)     Start Time: 1020 hrs. End Time: 1026 hrs. Materials:  Needle(s) Type: Regular needle Gauge: 25G Length: 1.5-in Medication(s): Please see orders for medications and dosing details.  Imaging Guidance:          Type of Imaging Technique: None used Indication(s): N/A Exposure Time: No patient exposure Contrast: None used. Fluoroscopic Guidance: N/A Ultrasound Guidance: N/A Interpretation: N/A  Antibiotic Prophylaxis:   Anti-infectives (From admission, onward)   None     Indication(s): None identified  Post-operative Assessment:  Post-procedure Vital Signs:  Pulse/HCG Rate: 62  Temp: 98.1 F (36.7 C) Resp: 18 BP: (!) 141/76 SpO2: 90 %  EBL: None  Complications: No immediate post-treatment complications observed by team, or reported by patient.  Note: The patient tolerated the entire procedure well. A repeat set of vitals were taken after the procedure and the patient was kept under observation following institutional policy, for this type of procedure. Post-procedural neurological assessment was performed, showing return to baseline, prior to discharge. The patient was provided with post-procedure discharge instructions, including a section on how to identify potential problems. Should any problems arise concerning this procedure, the patient was given instructions to immediately contact us, at  any time, without hesitation. In any case, we plan to contact the patient by telephone for a follow-up status report regarding this interventional procedure.  Comments:  No additional relevant information.  Plan of Care  Orders:  Orders Placed This Encounter  Procedures  . KNEE INJECTION    Hyalgan knee injection to be done by MD.    Scheduling Instructions:     Procedure: Intra-articular Hyalgan Knee injection #3     Side(s): Bilateral Knee     Sedation: None     Timeframe: Today    Order Specific Question:   Where will this procedure be performed?    Answer:   [facility]  . KNEE INJECTION    Hyalgan knee injection. Please order Hyalgan.    Standing Status:   Future    Standing Expiration Date:   05/30/2019    Scheduling Instructions:     Procedure: Intra-articular Hyalgan Knee injection #4     Side: Bilateral     Sedation: None     Timeframe: in two (2) weeks    Order Specific Question:   Where will this procedure be performed?    Answer:   [facility]  . Informed Consent Details: Physician/Practitioner Attestation; Transcribe to consent form and obtain patient signature    Provider Attestation: I, Ramey Dossie Arbour, MD, (Pain Management Specialist), the physician/practitioner, attest that I have discussed with the patient the benefits, risks, side effects, alternatives, likelihood of achieving goals and potential problems during recovery for the procedure that I have provided informed consent.    Scheduling Instructions:     Procedure: Therapeutic, bilateral, intra-articular Hyalgan knee injection     Indications: Chronic bilateral knee pain secondary to primary osteoarthritis of the knee     Note: Always confirm laterality of pain with Mr. Morone, before procedure.     Transcribe to consent form and obtain patient signature.  . Provide equipment / supplies at bedside    Equipment required: Single use, disposable, "Block Tray"    Standing Status:   Standing  Number of Occurrences:   1    Order Specific Question:   Specify    Answer:   Block Tray   Chronic Opioid Analgesic:  Tramadol 50mg , 1 tab PO QD (50 mg/day) MME/day: 50mg /day.   Medications ordered for procedure: Meds ordered this encounter  Medications  . lidocaine (PF) (XYLOCAINE) 1 % injection 5 mL  . ropivacaine (PF) 2 mg/mL (0.2%) (NAROPIN) injection 5 mL  . Sodium Hyaluronate SOSY 2 mL  . Sodium Hyaluronate SOSY 2 mL   Medications administered: We administered lidocaine (PF), ropivacaine (PF) 2 mg/mL (0.2%), Sodium Hyaluronate, and Sodium Hyaluronate.  See the medical record for exact dosing, route, and time of administration.  Follow-up plan:   Return in about 2 weeks (around 05/16/2019) for Procedure (no sedation): (B) Hyalgan #4.       Interventional treatment options: Planned, scheduled, and/or pending:    Under consideration: Diagnostic left femoral + obturator NB Possible left femoral + obturator nerve RFA Diagnostic left L2-3 LESI Diagnostic left L3-4 LESI Diagnosticleft L4-5 LESI Diagnosticleft L2, L3 and L4 TFESI  Diagnostic right L4 and L5 TFESI  Possible bilateral lumbar facet RFA Diagnostic bilateral genicular NB Possible bilateral genicular nerve RFA   Therapeutic/palliative (PRN): Palliative bilateral lumbar facet block#4   Palliativeleft IA hip joint injection #3 Palliative bilateral IA knee injection (steroids) #2  Palliative bilateral IA Hyalgan knee injection #[id number]     Recent Visits Date Type Provider Dept  04/13/19 Procedure visit Milinda Pointer, MD [facility]  03/30/19 Procedure visit Milinda Pointer, MD [facility]  03/22/19 Telemedicine Milinda Pointer, MD [facility]  03/07/19 Procedure visit Milinda Pointer, MD [facility]  02/28/19 Telemedicine Milinda Pointer, MD [facility]  02/14/19 Procedure visit Milinda Pointer, MD [facility]   02/09/19 Telemedicine Milinda Pointer, MD [facility]  Showing recent visits within past 90 days and meeting all other requirements   Today's Visits Date Type Provider Dept  05/02/19 Procedure visit Milinda Pointer, MD [facility]  Showing today's visits and meeting all other requirements   Future Appointments No visits were found meeting these conditions.  Showing future appointments within next 90 days and meeting all other requirements   Disposition: Discharge home  Discharge (Date  Time): 05/02/2019;  1055 hrs.   Primary Care Physician: Kirk Ruths, MD Location: [facility] Note by: Gaspar Cola, MD Date: 05/02/2019; Time: 11:09 AM  Disclaimer:  Medicine is not an Chief Strategy Officer. The only guarantee in medicine is that nothing is guaranteed. It is important to note that the decision to proceed with this intervention was based on the information collected from the patient. The Data and conclusions were drawn from the patient's questionnaire, the interview, and the physical examination. Because the information was provided in large part by the patient, it cannot be guaranteed that it has not been purposely or unconsciously manipulated. Every effort has been made to obtain as much relevant data as possible for this evaluation. It is important to note that the conclusions that lead to this procedure are derived in large part from the available data. Always take into account that the treatment will also be dependent on availability of resources and existing treatment guidelines, considered by other Pain Management Practitioners as being common knowledge and practice, at the time of the intervention. For Medico-Legal purposes, it is also important to point out that variation in procedural techniques and pharmacological choices are the acceptable norm. The indications,  contraindications, technique, and results of the above  procedure should only be interpreted and judged by a Board-Certified Interventional Pain Specialist with extensive familiarity and expertise in the same exact procedure and technique.

## 2019-05-03 DIAGNOSIS — E1142 Type 2 diabetes mellitus with diabetic polyneuropathy: Secondary | ICD-10-CM | POA: Diagnosis not present

## 2019-05-03 DIAGNOSIS — B351 Tinea unguium: Secondary | ICD-10-CM | POA: Diagnosis not present

## 2019-05-15 ENCOUNTER — Other Ambulatory Visit: Payer: Self-pay | Admitting: *Deleted

## 2019-05-15 DIAGNOSIS — C61 Malignant neoplasm of prostate: Secondary | ICD-10-CM

## 2019-05-16 ENCOUNTER — Other Ambulatory Visit: Payer: Medicare HMO

## 2019-05-16 ENCOUNTER — Other Ambulatory Visit: Payer: Self-pay

## 2019-05-16 DIAGNOSIS — C61 Malignant neoplasm of prostate: Secondary | ICD-10-CM

## 2019-05-17 LAB — PSA: Prostate Specific Ag, Serum: <0.1 ng/mL (ref 0.0–4.0)

## 2019-05-18 ENCOUNTER — Other Ambulatory Visit: Payer: Self-pay

## 2019-05-18 ENCOUNTER — Ambulatory Visit: Payer: Medicare HMO | Attending: Pain Medicine | Admitting: Pain Medicine

## 2019-05-18 ENCOUNTER — Encounter: Payer: Self-pay | Admitting: Pain Medicine

## 2019-05-18 VITALS — BP 169/83 | HR 67 | Temp 98.4°F | Resp 16 | Ht 69.0 in | Wt 212.0 lb

## 2019-05-18 DIAGNOSIS — M170 Bilateral primary osteoarthritis of knee: Secondary | ICD-10-CM | POA: Diagnosis not present

## 2019-05-18 DIAGNOSIS — G8929 Other chronic pain: Secondary | ICD-10-CM | POA: Insufficient documentation

## 2019-05-18 DIAGNOSIS — M25562 Pain in left knee: Secondary | ICD-10-CM | POA: Diagnosis not present

## 2019-05-18 DIAGNOSIS — M25561 Pain in right knee: Secondary | ICD-10-CM | POA: Insufficient documentation

## 2019-05-18 MED ORDER — SODIUM HYALURONATE (VISCOSUP) 20 MG/2ML IX SOSY
2.0000 mL | PREFILLED_SYRINGE | Freq: Once | INTRA_ARTICULAR | Status: AC
  Administered 2019-05-18: 2 mL via INTRA_ARTICULAR

## 2019-05-18 MED ORDER — LIDOCAINE HCL (PF) 1 % IJ SOLN
5.0000 mL | Freq: Once | INTRAMUSCULAR | Status: AC
  Administered 2019-05-18: 11:00:00 5 mL
  Filled 2019-05-18: qty 5

## 2019-05-18 MED ORDER — ROPIVACAINE HCL 2 MG/ML IJ SOLN
5.0000 mL | Freq: Once | INTRAMUSCULAR | Status: AC
  Administered 2019-05-18: 5 mL via INTRA_ARTICULAR
  Filled 2019-05-18: qty 10

## 2019-05-18 NOTE — Patient Instructions (Addendum)
____________________________________________________________________________________________  Post-Procedure Discharge Instructions  Instructions:  Apply ice:   Purpose: This will minimize any swelling and discomfort after procedure.   When: Day of procedure, as soon as you get home.  How: Fill a plastic sandwich bag with crushed ice. Cover it with a small towel and apply to injection site.  How long: (15 min on, 15 min off) Apply for 15 minutes then remove x 15 minutes.  Repeat sequence on day of procedure, until you go to bed.  Apply heat:   Purpose: To treat any soreness and discomfort from the procedure.  When: Starting the next day after the procedure.  How: Apply heat to procedure site starting the day following the procedure.  How long: May continue to repeat daily, until discomfort goes away.  Food intake: Start with clear liquids (like water) and advance to regular food, as tolerated.   Physical activities: Keep activities to a minimum for the first 8 hours after the procedure. After that, then as tolerated.  Driving: If you have received any sedation, be responsible and do not drive. You are not allowed to drive for 24 hours after having sedation.  Blood thinner: (Applies only to those taking blood thinners) You may restart your blood thinner 6 hours after your procedure.  Insulin: (Applies only to Diabetic patients taking insulin) As soon as you can eat, you may resume your normal dosing schedule.  Infection prevention: Keep procedure site clean and dry. Shower daily and clean area with soap and water.  Post-procedure Pain Diary: Extremely important that this be done correctly and accurately. Recorded information will be used to determine the next step in treatment. For the purpose of accuracy, follow these rules:  Evaluate only the area treated. Do not report or include pain from an untreated area. For the purpose of this evaluation, ignore all other areas of pain,  except for the treated area.  After your procedure, avoid taking a long nap and attempting to complete the pain diary after you wake up. Instead, set your alarm clock to go off every hour, on the hour, for the initial 8 hours after the procedure. Document the duration of the numbing medicine, and the relief you are getting from it.  Do not go to sleep and attempt to complete it later. It will not be accurate. If you received sedation, it is likely that you were given a medication that may cause amnesia. Because of this, completing the diary at a later time may cause the information to be inaccurate. This information is needed to plan your care.  Follow-up appointment: Keep your post-procedure follow-up evaluation appointment after the procedure (usually 2 weeks for most procedures, 6 weeks for radiofrequencies). DO NOT FORGET to bring you pain diary with you.   Expect: (What should I expect to see with my procedure?)  From numbing medicine (AKA: Local Anesthetics): Numbness or decrease in pain. You may also experience some weakness, which if present, could last for the duration of the local anesthetic.  Onset: Full effect within 15 minutes of injected.  Duration: It will depend on the type of local anesthetic used. On the average, 1 to 8 hours.   From steroids (Applies only if steroids were used): Decrease in swelling or inflammation. Once inflammation is improved, relief of the pain will follow.  Onset of benefits: Depends on the amount of swelling present. The more swelling, the longer it will take for the benefits to be seen. In some cases, up to 10 days.    Duration: Steroids will stay in the system x 2 weeks. Duration of benefits will depend on multiple posibilities including persistent irritating factors.  Side-effects: If present, they may typically last 2 weeks (the duration of the steroids).  Frequent: Cramps (if they occur, drink Gatorade and take over-the-counter Magnesium 450-500 mg  once to twice a day); water retention with temporary weight gain; increases in blood sugar; decreased immune system response; increased appetite.  Occasional: Facial flushing (red, warm cheeks); mood swings; menstrual changes.  Uncommon: Long-term decrease or suppression of natural hormones; bone thinning. (These are more common with higher doses or more frequent use. This is why we prefer that our patients avoid having any injection therapies in other practices.)   Very Rare: Severe mood changes; psychosis; aseptic necrosis.  From procedure: Some discomfort is to be expected once the numbing medicine wears off. This should be minimal if ice and heat are applied as instructed.  Call if: (When should I call?)  You experience numbness and weakness that gets worse with time, as opposed to wearing off.  New onset bowel or bladder incontinence. (Applies only to procedures done in the spine)  Emergency Numbers:  Durning business hours (Monday - Thursday, 8:00 AM - 4:00 PM) (Friday, 9:00 AM - 12:00 Noon): [phone]  After hours: [phone]  NOTE: If you are having a problem and are unable connect with, or to talk to a provider, then go to your nearest urgent care or emergency department. If the problem is serious and urgent, please call 911. ____________________________________________________________________________________________   ____________________________________________________________________________________________  Preparing for your procedure (without sedation)  Procedure appointments are limited to planned procedures: . No Prescription Refills. . No disability issues will be discussed. . No medication changes will be discussed.  Instructions: . Oral Intake: Do not eat or drink anything for at least 3 hours prior to your procedure. . Transportation: Unless otherwise stated by your physician, you may drive yourself after the procedure. . Blood Pressure Medicine:  Take your blood pressure medicine with a sip of water the morning of the procedure. . Blood thinners: Notify our staff if you are taking any blood thinners. Depending on which one you take, there will be specific instructions on how and when to stop it. . Diabetics on insulin: Notify the staff so that you can be scheduled 1st case in the morning. If your diabetes requires high dose insulin, take only  of your normal insulin dose the morning of the procedure and notify the staff that you have done so. . Preventing infections: Shower with an antibacterial soap the morning of your procedure.  . Build-up your immune system: Take 1000 mg of Vitamin C with every meal (3 times a day) the day prior to your procedure. . Antibiotics: Inform the staff if you have a condition or reason that requires you to take antibiotics before dental procedures. . Pregnancy: If you are pregnant, call and cancel the procedure. . Sickness: If you have a cold, fever, or any active infections, call and cancel the procedure. . Arrival: You must be in the facility at least 30 minutes prior to your scheduled procedure. . Children: Do not bring any children with you. . Dress appropriately: Bring dark clothing that you would not mind if they get stained. . Valuables: Do not bring any jewelry or valuables.  Reasons to call and reschedule or cancel your procedure: (Following these recommendations will minimize the risk of a serious complication.) . Surgeries: Avoid having procedures within 2 weeks   of any surgery. (Avoid for 2 weeks before or after any surgery). . Flu Shots: Avoid having procedures within 2 weeks of a flu shots or . (Avoid for 2 weeks before or after immunizations). . Barium: Avoid having a procedure within 7-10 days after having had a radiological study involving the use of radiological contrast. (Myelograms, Barium swallow or enema study). . Heart attacks: Avoid any elective procedures or surgeries for the initial 6  months after a "Myocardial Infarction" (Heart Attack). . Blood thinners: It is imperative that you stop these medications before procedures. Let us know if you if you take any blood thinner.  . Infection: Avoid procedures during or within two weeks of an infection (including chest colds or gastrointestinal problems). Symptoms associated with infections include: Localized redness, fever, chills, night sweats or profuse sweating, burning sensation when voiding, cough, congestion, stuffiness, runny nose, sore throat, diarrhea, nausea, vomiting, cold or Flu symptoms, recent or current infections. It is specially important if the infection is over the area that we intend to treat. Marland Kitchen Heart and lung problems: Symptoms that may suggest an active cardiopulmonary problem include: cough, chest pain, breathing difficulties or shortness of breath, dizziness, ankle swelling, uncontrolled high or unusually low blood pressure, and/or palpitations. If you are experiencing any of these symptoms, cancel your procedure and contact your primary care physician for an evaluation.  Remember:  Regular Business hours are:  Monday to Thursday 8:00 AM to 4:00 PM  Provider's Schedule: Milinda Pointer, MD:  Procedure days: Tuesday and Thursday 7:30 AM to 4:00 PM  Gillis Santa, MD:  Procedure days: Monday and Wednesday 7:30 AM to 4:00 PM ____________________________________________________________________________________________   Pain Management Discharge Instructions  General Discharge Instructions :  If you need to reach your doctor call: Monday-Friday 8:00 am - 4:00 pm at [phone] or toll free [phone].  After clinic hours [phone] to have operator reach doctor.  Bring all of your medication bottles to all your appointments in the pain clinic.  To cancel or reschedule your appointment with Pain Management please remember to call 24 hours in advance to avoid a fee.  Refer to the educational materials  which you have been given on: General Risks, I had my Procedure. Discharge Instructions, Post Sedation.  Post Procedure Instructions:  The drugs you were given will stay in your system until tomorrow, so for the next 24 hours you should not drive, make any legal decisions or drink any alcoholic beverages.  You may eat anything you prefer, but it is better to start with liquids then soups and crackers, and gradually work up to solid foods.  Please notify your doctor immediately if you have any unusual bleeding, trouble breathing or pain that is not related to your normal pain.  Depending on the type of procedure that was done, some parts of your body may feel week and/or numb.  This usually clears up by tonight or the next day.  Walk with the use of an assistive device or accompanied by an adult for the 24 hours.  You may use ice on the affected area for the first 24 hours.  Put ice in a Ziploc bag and cover with a towel and place against area 15 minutes on 15 minutes off.  You may switch to heat after 24 hours.

## 2019-05-18 NOTE — Progress Notes (Signed)
PROVIDER NOTE: Information contained herein reflects review and annotations entered in association with encounter. Interpretation of such information and data should be left to medically-trained personnel. Information provided to patient can be located elsewhere in the medical record under "Patient Instructions". Document created using STT-dictation technology, any transcriptional errors that may result from process are unintentional.    Patient: Joshua Humphrey  Service Category: Procedure  Provider: Gaspar Cola, MD  DOB: 10-Jan-1930  DOS: 05/18/2019  Location: [facility]  MRN: [id number]  Setting: Ambulatory - outpatient  Referring Provider: Milinda Pointer, MD  Type: Established Patient  Specialty: Interventional Pain Management  PCP: Kirk Ruths, MD   Primary Reason for Visit: Interventional Pain Management Treatment. CC: Knee Pain (bilateral)  Procedure:          Anesthesia, Analgesia, Anxiolysis:  Type: Therapeutic Intra-Articular Hyalgan Knee Injection #4  Region: Lateral infrapatellar Knee Region Level: Knee Joint Laterality: Bilateral  Type: Local Anesthesia Indication(s): Analgesia         Local Anesthetic: Lidocaine 1-2% Route: Infiltration ([place]/IM) IV Access: Declined Sedation: Declined   Position: Sitting   Indications: 1. Osteoarthritis of knee (Bilateral)   2. Chronic knee pain (Bilateral) (L>R)    Pain Score: Pre-procedure: 4 /10 Post-procedure: 4 /10   Pre-op Assessment:  Joshua Humphrey is a 86 y.o. (year old), male patient, seen today for interventional treatment. He  has a past surgical history that includes tumor removed (2000); Prostate surgery (2002); lipoma removal (2000); Tonsillectomy; Cataract extraction, bilateral; Toe Surgery; Olecranon bursectomy (Left, 01/21/2016); Elbow surgery; and Eye surgery. Joshua Humphrey has a current medication list which includes the following prescription(s): acetaminophen, albuterol, aspirin ec,  atorvastatin, b-d ultrafine iii short pen, benzonatate, calcium carbonate-vitamin d, carvedilol, cetirizine, ferrous fumarate, flaxseed (linseed), fluticasone, furosemide, gabapentin, glucosamine-chondroitin, hydralazine, ipratropium-albuterol, levemir flextouch, losartan, magnesium oxide, metformin, mometasone-formoterol, montelukast, ocuvite adult 50+, nifedipine, omeprazole, potassium chloride sa, pramipexole, sitagliptin, tramadol, and torsemide. His primarily concern today is the Knee Pain (bilateral)  Initial Vital Signs:  Pulse/HCG Rate: 63  Temp: 98.4 F (36.9 C) Resp: 18 BP: (!) 147/68 SpO2: 91 %  BMI: Estimated body mass index is 31.31 kg/m as calculated from the following:   Height as of this encounter: 5\' 9"  (1.753 m).   Weight as of this encounter: 212 lb (96.2 kg).  Risk Assessment: Allergies: Reviewed. He has No Known Allergies.  Allergy Precautions: None required Coagulopathies: Reviewed. None identified.  Blood-thinner therapy: None at this time Active Infection(s): Reviewed. None identified. Joshua Humphrey is afebrile  Site Confirmation: Joshua Humphrey was asked to confirm the procedure and laterality before marking the site Procedure checklist: Completed Consent: Before the procedure and under the influence of no sedative(s), amnesic(s), or anxiolytics, the patient was informed of the treatment options, risks and possible complications. To fulfill our ethical and legal obligations, as recommended by the American Medical Association's Code of Ethics, I have informed the patient of my clinical impression; the nature and purpose of the treatment or procedure; the risks, benefits, and possible complications of the intervention; the alternatives, including doing nothing; the risk(s) and benefit(s) of the alternative treatment(s) or procedure(s); and the risk(s) and benefit(s) of doing nothing. The patient was provided information about the general risks and possible complications  associated with the procedure. These may include, but are not limited to: failure to achieve desired goals, infection, bleeding, organ or nerve damage, allergic reactions, paralysis, and death. In addition, the patient was informed of those risks and complications associated to  the procedure, such as failure to decrease pain; infection; bleeding; organ or nerve damage with subsequent damage to sensory, motor, and/or autonomic systems, resulting in permanent pain, numbness, and/or weakness of one or several areas of the body; allergic reactions; (i.e.: anaphylactic reaction); and/or death. Furthermore, the patient was informed of those risks and complications associated with the medications. These include, but are not limited to: allergic reactions (i.e.: anaphylactic or anaphylactoid reaction(s)); adrenal axis suppression; blood sugar elevation that in diabetics may result in ketoacidosis or comma; water retention that in patients with history of congestive heart failure may result in shortness of breath, pulmonary edema, and decompensation with resultant heart failure; weight gain; swelling or edema; medication-induced neural toxicity; particulate matter embolism and blood vessel occlusion with resultant organ, and/or nervous system infarction; and/or aseptic necrosis of one or more joints. Finally, the patient was informed that Medicine is not an exact science; therefore, there is also the possibility of unforeseen or unpredictable risks and/or possible complications that may result in a catastrophic outcome. The patient indicated having understood very clearly. We have given the patient no guarantees and we have made no promises. Enough time was given to the patient to ask questions, all of which were answered to the patient's satisfaction. Joshua Humphrey has indicated that he wanted to continue with the procedure. Attestation: I, the ordering provider, attest that I have discussed with the patient the benefits,  risks, side-effects, alternatives, likelihood of achieving goals, and potential problems during recovery for the procedure that I have provided informed consent. Date  Time: 05/18/2019 10:22 AM  Pre-Procedure Preparation:  Monitoring: As per clinic protocol. Respiration, ETCO2, SpO2, BP, heart rate and rhythm monitor placed and checked for adequate function Safety Precautions: Patient was assessed for positional comfort and pressure points before starting the procedure. Time-out: I initiated and conducted the "Time-out" before starting the procedure, as per protocol. The patient was asked to participate by confirming the accuracy of the "Time Out" information. Verification of the correct person, site, and procedure were performed and confirmed by me, the nursing staff, and the patient. "Time-out" conducted as per Joint Commission's Universal Protocol (UP.01.01.01). Time: 1047  Description of Procedure:          Target Area: Knee Joint Approach: Just above the Lateral tibial plateau, lateral to the infrapatellar tendon. Area Prepped: Entire knee area, from the mid-thigh to the mid-shin. Prepping solution: DuraPrep (Iodine Povacrylex [0.7% available iodine] and Isopropyl Alcohol, 74% w/w) Safety Precautions: Aspiration looking for blood return was conducted prior to all injections. At no point did we inject any substances, as a needle was being advanced. No attempts were made at seeking any paresthesias. Safe injection practices and needle disposal techniques used. Medications properly checked for expiration dates. SDV (single dose vial) medications used. Description of the Procedure: Protocol guidelines were followed. The patient was placed in position over the fluoroscopy table. The target area was identified and the area prepped in the usual manner. Skin & deeper tissues infiltrated with local anesthetic. Appropriate amount of time allowed to pass for local anesthetics to take effect. The procedure  needles were then advanced to the target area. Proper needle placement secured. Negative aspiration confirmed. Solution injected in intermittent fashion, asking for systemic symptoms every 0.5cc of injectate. The needles were then removed and the area cleansed, making sure to leave some of the prepping solution back to take advantage of its long term bactericidal properties. Vitals:   05/18/19 1021 05/18/19 1022 05/18/19 1052  BP:  Marland Kitchen)  147/68 (!) 169/83  Pulse:  63 67  Resp:  18 16  Temp:  98.4 F (36.9 C)   TempSrc:  Oral   SpO2:  91% 93%  Weight: 212 lb (96.2 kg)    Height: 5\' 9"  (1.753 m)      Start Time: 1047 hrs. End Time: 1050 hrs. Materials:  Needle(s) Type: Regular needle Gauge: 25G Length: 1.5-in Medication(s): Please see orders for medications and dosing details.  Imaging Guidance:          Type of Imaging Technique: None used Indication(s): N/A Exposure Time: No patient exposure Contrast: None used. Fluoroscopic Guidance: N/A Ultrasound Guidance: N/A Interpretation: N/A  Antibiotic Prophylaxis:   Anti-infectives (From admission, onward)   None     Indication(s): None identified  Post-operative Assessment:  Post-procedure Vital Signs:  Pulse/HCG Rate: 67  Temp: 98.4 F (36.9 C) Resp: 16 BP: (!) 169/83 SpO2: 93 %  EBL: None  Complications: No immediate post-treatment complications observed by team, or reported by patient.  Note: The patient tolerated the entire procedure well. A repeat set of vitals were taken after the procedure and the patient was kept under observation following institutional policy, for this type of procedure. Post-procedural neurological assessment was performed, showing return to baseline, prior to discharge. The patient was provided with post-procedure discharge instructions, including a section on how to identify potential problems. Should any problems arise concerning this procedure, the patient was given instructions to  immediately contact us, at any time, without hesitation. In any case, we plan to contact the patient by telephone for a follow-up status report regarding this interventional procedure.  Comments:  No additional relevant information.  Plan of Care  Orders:  Orders Placed This Encounter  Procedures  . KNEE INJECTION    Hyalgan knee injection to be done by MD.    Scheduling Instructions:     Procedure: Intra-articular Hyalgan Knee injection #4     Side(s): Bilateral Knee     Sedation: None     Timeframe: Today    Order Specific Question:   Where will this procedure be performed?    Answer:   [facility]  . KNEE INJECTION    Hyalgan knee injection. Please order Hyalgan.    Standing Status:   Future    Standing Expiration Date:   06/18/2019    Scheduling Instructions:     Procedure: Intra-articular Hyalgan Knee injection #5     Side: Bilateral     Sedation: None     Timeframe: in two (2) weeks    Order Specific Question:   Where will this procedure be performed?    Answer:   [facility]  . Informed Consent Details: Physician/Practitioner Attestation; Transcribe to consent form and obtain patient signature    Provider Attestation: I, Rector Dossie Arbour, MD, (Pain Management Specialist), the physician/practitioner, attest that I have discussed with the patient the benefits, risks, side effects, alternatives, likelihood of achieving goals and potential problems during recovery for the procedure that I have provided informed consent.    Scheduling Instructions:     Procedure: Therapeutic, bilateral, intra-articular Hyalgan knee injection     Indications: Chronic bilateral knee pain secondary to primary osteoarthritis of the knee     Note: Always confirm laterality of pain with Mr. Sermeno, before procedure.     Transcribe to consent form and obtain patient signature.  . Provide equipment / supplies at bedside    Equipment required: Single use, disposable, "Block Tray"  Standing Status:   Standing    Number of Occurrences:   1    Order Specific Question:   Specify    Answer:   Block Tray   Chronic Opioid Analgesic:  Tramadol 50mg , 1 tab PO QD (50 mg/day) MME/day: 50mg /day.   Medications ordered for procedure: Meds ordered this encounter  Medications  . lidocaine (PF) (XYLOCAINE) 1 % injection 5 mL  . ropivacaine (PF) 2 mg/mL (0.2%) (NAROPIN) injection 5 mL  . Sodium Hyaluronate SOSY 2 mL  . Sodium Hyaluronate SOSY 2 mL   Medications administered: We administered lidocaine (PF), ropivacaine (PF) 2 mg/mL (0.2%), Sodium Hyaluronate, and Sodium Hyaluronate.  See the medical record for exact dosing, route, and time of administration.  Follow-up plan:   Return in about 2 weeks (around 06/01/2019) for Procedure (no sedation): (B) Hyalgan #5.       Interventional treatment options: Planned, scheduled, and/or pending:    Under consideration: Diagnostic left femoral + obturator NB Possible left femoral + obturator nerve RFA Diagnostic left L2-3 LESI Diagnostic left L3-4 LESI Diagnosticleft L4-5 LESI Diagnosticleft L2, L3 and L4 TFESI  Diagnostic right L4 and L5 TFESI  Possible bilateral lumbar facet RFA Diagnostic bilateral genicular NB Possible bilateral genicular nerve RFA   Therapeutic/palliative (PRN): Palliative bilateral lumbar facet block#4   Palliativeleft IA hip joint injection #3 Palliative bilateral IA knee injection (steroids) #2  Palliative bilateral IA Hyalgan knee injection #S3N3      Recent Visits Date Type Provider Dept  05/02/19 Procedure visit Milinda Pointer, MD [facility]  04/13/19 Procedure visit Milinda Pointer, MD [facility]  03/30/19 Procedure visit Milinda Pointer, MD [facility]  03/22/19 Telemedicine Milinda Pointer, MD [facility]  03/07/19 Procedure visit Milinda Pointer, MD [facility]  02/28/19 Telemedicine Milinda Pointer, MD [facility]  Showing recent visits within past 90 days and meeting all other requirements   Today's Visits Date Type Provider Dept  05/18/19 Procedure visit Milinda Pointer, MD [facility]  Showing today's visits and meeting all other requirements   Future Appointments Date Type Provider Dept  08/02/19 Appointment Milinda Pointer, MD [facility]  Showing future appointments within next 90 days and meeting all other requirements   Disposition: Discharge home  Discharge (Date  Time): 05/18/2019; 1100 hrs.   Primary Care Physician: Kirk Ruths, MD Location: [facility] Note by: Gaspar Cola, MD Date: 05/18/2019; Time: 11:09 AM  Disclaimer:  Medicine is not an Chief Strategy Officer. The only guarantee in medicine is that nothing is guaranteed. It is important to note that the decision to proceed with this intervention was based on the information collected from the patient. The Data and conclusions were drawn from the patient's questionnaire, the interview, and the physical examination. Because the information was provided in large part by the patient, it cannot be guaranteed that it has not been purposely or unconsciously manipulated. Every effort has been made to obtain as much relevant data as possible for this evaluation. It is important to note that the conclusions that lead to this procedure are derived in large part from the available data. Always take into account that the treatment will also be dependent on availability of resources and existing treatment guidelines, considered by other Pain Management Practitioners as being common knowledge and practice, at the time of the intervention. For Medico-Legal purposes, it is also important to point out that variation in procedural techniques and pharmacological choices are  the acceptable norm. The indications, contraindications, technique, and results of the  above procedure should only be interpreted and judged by a Board-Certified Interventional Pain Specialist with extensive familiarity and expertise in the same exact procedure and technique.

## 2019-05-19 ENCOUNTER — Encounter: Payer: Self-pay | Admitting: Urology

## 2019-05-19 ENCOUNTER — Ambulatory Visit: Payer: Medicare HMO | Admitting: Urology

## 2019-05-19 ENCOUNTER — Telehealth: Payer: Self-pay

## 2019-05-19 VITALS — BP 109/58 | HR 62 | Ht 69.0 in | Wt 212.0 lb

## 2019-05-19 DIAGNOSIS — Z8546 Personal history of malignant neoplasm of prostate: Secondary | ICD-10-CM | POA: Diagnosis not present

## 2019-05-19 NOTE — Telephone Encounter (Signed)
Post procedure phone call.  LM 

## 2019-05-19 NOTE — Progress Notes (Signed)
05/19/2019 1:09 PM   Joshua Humphrey 06/21/1929 [id number]  Referring provider: Kirk Ruths, MD [facility] [place],  [place] [address]  Chief Complaint  Patient presents with  . Prostate Cancer    Urologic history: 1.  Prostate cancer  -Status post radical prostatectomy 2001 for prostate cancer in [place].  -Apparently on long-term ADT although no metastatic disease. -Lupron was discontinued 2017; PSA has remained undetectable  HPI: 86 y.o. male presents for annual follow-up.    -No significant changes since last years visit -Mild urinary frequency, occasional urge incontinence, not bothersome -No dysuria, gross hematuria -No flank, abdominal or pelvic pain -PSA 05/16/2019 <0.1     PMH: Past Medical History:  Diagnosis Date  . Anemia   . Asthma   . Cancer [facility])    prostate  . Cancer ([place])    skin   . Diabetes mellitus without complication ([place])   . GERD (gastroesophageal reflux disease)   . Hypertension   . PONV (postoperative nausea and vomiting)   . Respiratory infection 08/23/2017  . Septic olecranon bursitis of left elbow 01/22/2016  . Sleep apnea     Surgical History: Past Surgical History:  Procedure Laterality Date  . CATARACT EXTRACTION, BILATERAL    . ELBOW SURGERY    . EYE SURGERY    . lipoma removal  2000   back   . OLECRANON BURSECTOMY Left 01/21/2016   Procedure: OLECRANON BURSA;  Surgeon: Corky Mull, MD;  Location: [facility];  Service: Orthopedics;  Laterality: Left;  . PROSTATE SURGERY  2002  . TOE SURGERY    . TONSILLECTOMY    . tumor removed  2000    Home Medications:  Allergies as of 05/19/2019   No Known Allergies     Medication List       Accurate as of May 19, 2019  1:09 PM. If you have any questions, ask your nurse or doctor.        acetaminophen 650 MG CR tablet Commonly known as: TYLENOL Take 650-1,300 mg by mouth every 8 (eight) hours as needed (for pain.).     albuterol 108 (90 Base) MCG/ACT inhaler Commonly known as: VENTOLIN HFA 2 puffs q.i.d. p.r.n. short of breath, wheezing, or cough   aspirin EC 81 MG tablet Take 81 mg by mouth daily.   atorvastatin 40 MG tablet Commonly known as: LIPITOR Take 40 mg by mouth daily.   B-D ULTRAFINE III SHORT PEN 31G X 8 MM Misc Generic drug: Insulin Pen Needle USE BID UTD   benzonatate 200 MG capsule Commonly known as: TESSALON Take 200 mg by mouth as needed.   CALTRATE 600+D PO Take 2 tablets by mouth daily.   carvedilol 25 MG tablet Commonly known as: COREG TAKE 1 TABLET (25 MG) TWICE DAILY   cetirizine 10 MG tablet Commonly known as: ZYRTEC Take 10 mg by mouth every evening.   Combivent Respimat 20-100 MCG/ACT Aers respimat Generic drug: Ipratropium-Albuterol INHALE 2 PUFF FOUR TIMES DAILY AS NEEDED FOR SHORTNESS OF BREATH AND/OR WHEEZING.   ferrous fumarate 325 (106 Fe) MG Tabs tablet Commonly known as: HEMOCYTE - 106 mg FE Take 1 tablet by mouth 2 (two) times daily.   FLAXSEED (LINSEED) PO Take by mouth daily.   fluticasone 50 MCG/ACT nasal spray Commonly known as: FLONASE Place 1-2 sprays into both nostrils daily as needed (for allergies.).   furosemide 40 MG tablet Commonly known as: LASIX TAKE 1 TABLET BY  MOUTH TWICE DAILY   gabapentin 300 MG capsule Commonly known as: Neurontin Take 3 capsules (900 mg total) by mouth 3 (three) times daily.   Glucosamine-Chondroitin Caps Take 2 capsules by mouth daily.   hydrALAZINE 50 MG tablet Commonly known as: APRESOLINE Take 50 mg by mouth 3 (three) times daily.   Levemir FlexTouch 100 UNIT/ML FlexPen Generic drug: insulin detemir Inject 15 Units into the skin every evening.   losartan 100 MG tablet Commonly known as: COZAAR Take 100 mg by mouth daily.   magnesium oxide 400 MG tablet Commonly known as: MAG-OX Take 400 mg by mouth daily.   metFORMIN 500 MG tablet Commonly known as: GLUCOPHAGE Take 500 mg by mouth 2  (two) times daily with a meal.   mometasone-formoterol 200-5 MCG/ACT Aero Commonly known as: DULERA Inhale 2 puffs into the lungs 2 (two) times daily.   montelukast 10 MG tablet Commonly known as: SINGULAIR Take 10 mg by mouth at bedtime.   NIFEdipine 90 MG 24 hr tablet Commonly known as: PROCARDIA XL/NIFEDICAL-XL Take 90 mg by mouth daily.   Ocuvite Adult 50+ Caps Take 1 capsule by mouth daily.   omeprazole 20 MG capsule Commonly known as: PRILOSEC Take 20 mg by mouth daily.   potassium chloride SA 20 MEQ tablet Commonly known as: KLOR-CON TAKE 1 TABLET(20 MEQ) BY MOUTH TWICE DAILY   pramipexole 0.25 MG tablet Commonly known as: MIRAPEX Take 0.25 mg by mouth at bedtime as needed (for restless leg syndrome).   sitaGLIPtin 100 MG tablet Commonly known as: JANUVIA Take 100 mg by mouth daily.   torsemide 20 MG tablet Commonly known as: DEMADEX Take by mouth.   traMADol 50 MG tablet Commonly known as: ULTRAM Take 1 tablet (50 mg total) by mouth every other day.       Allergies: No Known Allergies  Family History: Family History  Problem Relation Age of Onset  . Cancer Mother   . Alcohol abuse Father   . Cancer Brother     Social History:  reports that he has never smoked. He has never used smokeless tobacco. He reports current alcohol use. He reports that he does not use drugs.   Physical Exam: BP (!) 109/58   Pulse 62   Ht 5\' 9"  (1.753 m)   Wt 212 lb (96.2 kg)   BMI 31.31 kg/m   Constitutional:  Alert and oriented, No acute distress. HEENT: [place] AT, moist mucus membranes.  Trachea midline, no masses. Cardiovascular: No clubbing, cyanosis, or edema. Respiratory: Normal respiratory effort, no increased work of breathing.   Assessment & Plan:    - History prostate cancer PSA remains undetectable.  Continue annual follow-up.   Abbie Sons, [place] [address] [place], [place] [address] [phone]

## 2019-05-24 DIAGNOSIS — R270 Ataxia, unspecified: Secondary | ICD-10-CM | POA: Diagnosis not present

## 2019-05-24 DIAGNOSIS — R531 Weakness: Secondary | ICD-10-CM | POA: Diagnosis not present

## 2019-05-24 DIAGNOSIS — M25569 Pain in unspecified knee: Secondary | ICD-10-CM | POA: Diagnosis not present

## 2019-05-25 DIAGNOSIS — I10 Essential (primary) hypertension: Secondary | ICD-10-CM | POA: Diagnosis not present

## 2019-05-25 DIAGNOSIS — N181 Chronic kidney disease, stage 1: Secondary | ICD-10-CM | POA: Diagnosis not present

## 2019-05-25 DIAGNOSIS — Z794 Long term (current) use of insulin: Secondary | ICD-10-CM | POA: Diagnosis not present

## 2019-05-25 DIAGNOSIS — I350 Nonrheumatic aortic (valve) stenosis: Secondary | ICD-10-CM | POA: Diagnosis not present

## 2019-05-25 DIAGNOSIS — I5032 Chronic diastolic (congestive) heart failure: Secondary | ICD-10-CM | POA: Diagnosis not present

## 2019-05-25 DIAGNOSIS — E1122 Type 2 diabetes mellitus with diabetic chronic kidney disease: Secondary | ICD-10-CM | POA: Diagnosis not present

## 2019-05-31 DIAGNOSIS — E1122 Type 2 diabetes mellitus with diabetic chronic kidney disease: Secondary | ICD-10-CM | POA: Diagnosis not present

## 2019-05-31 DIAGNOSIS — I130 Hypertensive heart and chronic kidney disease with heart failure and stage 1 through stage 4 chronic kidney disease, or unspecified chronic kidney disease: Secondary | ICD-10-CM | POA: Diagnosis not present

## 2019-05-31 DIAGNOSIS — Z794 Long term (current) use of insulin: Secondary | ICD-10-CM | POA: Diagnosis not present

## 2019-05-31 DIAGNOSIS — N181 Chronic kidney disease, stage 1: Secondary | ICD-10-CM | POA: Diagnosis not present

## 2019-05-31 DIAGNOSIS — E785 Hyperlipidemia, unspecified: Secondary | ICD-10-CM | POA: Diagnosis not present

## 2019-05-31 DIAGNOSIS — I5032 Chronic diastolic (congestive) heart failure: Secondary | ICD-10-CM | POA: Diagnosis not present

## 2019-05-31 DIAGNOSIS — S81802A Unspecified open wound, left lower leg, initial encounter: Secondary | ICD-10-CM | POA: Diagnosis not present

## 2019-05-31 DIAGNOSIS — G4733 Obstructive sleep apnea (adult) (pediatric): Secondary | ICD-10-CM | POA: Diagnosis not present

## 2019-06-01 ENCOUNTER — Ambulatory Visit: Payer: Medicare HMO | Admitting: Pain Medicine

## 2019-06-01 DIAGNOSIS — N181 Chronic kidney disease, stage 1: Secondary | ICD-10-CM | POA: Diagnosis not present

## 2019-06-01 DIAGNOSIS — H35363 Drusen (degenerative) of macula, bilateral: Secondary | ICD-10-CM | POA: Diagnosis not present

## 2019-06-01 DIAGNOSIS — E1122 Type 2 diabetes mellitus with diabetic chronic kidney disease: Secondary | ICD-10-CM | POA: Diagnosis not present

## 2019-06-01 DIAGNOSIS — H04123 Dry eye syndrome of bilateral lacrimal glands: Secondary | ICD-10-CM | POA: Diagnosis not present

## 2019-06-01 DIAGNOSIS — Z794 Long term (current) use of insulin: Secondary | ICD-10-CM | POA: Diagnosis not present

## 2019-06-08 DIAGNOSIS — N181 Chronic kidney disease, stage 1: Secondary | ICD-10-CM | POA: Diagnosis not present

## 2019-06-08 DIAGNOSIS — I5032 Chronic diastolic (congestive) heart failure: Secondary | ICD-10-CM | POA: Diagnosis not present

## 2019-06-08 DIAGNOSIS — I10 Essential (primary) hypertension: Secondary | ICD-10-CM | POA: Diagnosis not present

## 2019-06-08 DIAGNOSIS — G4733 Obstructive sleep apnea (adult) (pediatric): Secondary | ICD-10-CM | POA: Diagnosis not present

## 2019-06-08 DIAGNOSIS — Z794 Long term (current) use of insulin: Secondary | ICD-10-CM | POA: Diagnosis not present

## 2019-06-08 DIAGNOSIS — E782 Mixed hyperlipidemia: Secondary | ICD-10-CM | POA: Diagnosis not present

## 2019-06-08 DIAGNOSIS — E1122 Type 2 diabetes mellitus with diabetic chronic kidney disease: Secondary | ICD-10-CM | POA: Diagnosis not present

## 2019-06-27 DIAGNOSIS — G4733 Obstructive sleep apnea (adult) (pediatric): Secondary | ICD-10-CM | POA: Diagnosis not present

## 2019-07-31 NOTE — Progress Notes (Signed)
Patient: Joshua Humphrey  Service Category: E/M  Provider: Gaspar Cola, MD  DOB: 16-Aug-1929  DOS: 08/02/2019  Location: Office  MRN: [id number]  Setting: Ambulatory outpatient  Referring Provider: Kirk Ruths, MD  Type: Established Patient  Specialty: Interventional Pain Management  PCP: Kirk Ruths, MD  Location: Remote location  Delivery: TeleHealth     Virtual Encounter - Pain Management PROVIDER NOTE: Information contained herein reflects review and annotations entered in association with encounter. Interpretation of such information and data should be left to medically-trained personnel. Information provided to patient can be located elsewhere in the medical record under "Patient Instructions". Document created using STT-dictation technology, any transcriptional errors that may result from process are unintentional.    Contact & Pharmacy Preferred: [phone] Home: [phone] (home) Mobile: [phone] (mobile) E-mail: [email or web address]  [facility] [id number], [place] [address] Phone: [phone] Fax: [place], [place] [address] Phone: [phone] Fax: [phone]   Pre-screening  Joshua Humphrey offered "in-person" vs "virtual" encounter. He indicated preferring virtual for this encounter.   Reason COVID-19*  Social distancing based on CDC and AMA recommendations.   I contacted Joshua Humphrey on 08/02/2019 via telephone.      I clearly identified myself as Gaspar Cola, MD. I verified that I was speaking with the correct person using two identifiers (Name: Joshua Humphrey, and date of birth: Apr 09, 1929).  Consent I sought verbal advanced consent from Joshua Humphrey for virtual visit interactions. I informed Joshua Humphrey of possible security and privacy concerns,  risks, and limitations associated with providing "not-in-person" medical evaluation and management services. I also informed Joshua Humphrey of the availability of "in-person" appointments. Finally, I informed him that there would be a charge for the virtual visit and that he could be  personally, fully or partially, financially responsible for it. Joshua Humphrey expressed understanding and agreed to proceed.   Historic Elements   Joshua Humphrey is a 86 y.o. year old, male patient evaluated today after his last contact with our practice on 05/19/2019. Joshua Humphrey  has a past medical history of Anemia, Asthma, Cancer ([place]), Cancer ([place]), Diabetes mellitus without complication ([place]), GERD (gastroesophageal reflux disease), Hypertension, PONV (postoperative nausea and vomiting), Respiratory infection (08/23/2017), Septic olecranon bursitis of left elbow (01/22/2016), and Sleep apnea. He also  has a past surgical history that includes tumor removed (2000); Prostate surgery (2002); lipoma removal (2000); Tonsillectomy; Cataract extraction, bilateral; Toe Surgery; Olecranon bursectomy (Left, 01/21/2016); Elbow surgery; and Eye surgery. Joshua Humphrey has a current medication list which includes the following prescription(s): acetaminophen, albuterol, aspirin ec, atorvastatin, b-d ultrafine iii short pen, benzonatate, calcium carbonate-vitamin d, carvedilol, cetirizine, ferrous fumarate, flaxseed (linseed), fluticasone, [START ON 08/08/2019] gabapentin, glucosamine-chondroitin, hydralazine, ipratropium-albuterol, levemir flextouch, losartan, magnesium oxide, metformin, montelukast, ocuvite adult 50+, nifedipine, omeprazole, potassium chloride sa, pramipexole, sitagliptin, torsemide, and tramadol. He  reports that he has never smoked. He has never used smokeless tobacco. He reports current alcohol use. He reports that he does not use drugs. Joshua Humphrey has No Known Allergies.   HPI  Today, he is being contacted for medication  management. The patient indicates doing well with the current medication regimen. No adverse reactions or side effects reported to the medications.  The patient indicates that he is still taking  the tramadol, but not on a regular basis.  He indicates that with his current regimen he is doing well and although he is having a little bit more knee pain, he thinks that he does not need anything else at this point.  I did remind him that they Hyalgan knee injections can be repeated again and they are supposed to provide him with benefit that may last anywhere from 6 months to 8 months.  I told him that should he need to have those repeated, he just needed to give [place] a call.  He understood and accepted.  I also reminded the patient that we needed to update his urine drug screening test and that his next refill would be a face-to-face visit.  Pharmacotherapy Assessment  Analgesic: Tramadol 64m, 1 tab PO QD (50 mg/day) MME/day: 578mday.   Monitoring: [place] PMP: PDMP reviewed during this encounter.       Pharmacotherapy: No side-effects or adverse reactions reported. Compliance: No problems identified. Effectiveness: Clinically acceptable. Plan: Refer to "POC".  UDS:  Summary  Date Value Ref Range Status  09/08/2016 FINAL  Final    Comment:    ==================================================================== TOXASSURE COMP DRUG ANALYSIS,UR ==================================================================== Test                             Result       Flag       Units Drug Present and Declared for Prescription Verification   Gabapentin                     PRESENT      EXPECTED   Acetaminophen                  PRESENT      EXPECTED Drug Absent but Declared for Prescription Verification   Hydrocodone                    Not Detected UNEXPECTED ng/mg creat   Tramadol                       Not Detected UNEXPECTED   Salicylate                     Not Detected UNEXPECTED    Aspirin, as indicated in  the declared medication list, is not    always detected even when used as directed. ==================================================================== Test                      Result    Flag   Units      Ref Range   Creatinine              23               mg/dL      >=20 ==================================================================== Declared Medications:  The flagging and interpretation on this report are based on the  following declared medications.  Unexpected results may arise from  inaccuracies in the declared medications.  **Note: The testing scope of this panel includes these medications:  Gabapentin  Hydrocodone (Norco)  Tramadol (Ultram)  **Note: The testing scope of this panel does not include small to  moderate amounts of these reported medications:  Acetaminophen (Norco)  Acetaminophen (Tylenol)  Aspirin (Aspirin 81)  **Note: The testing scope of this panel does not include following  reported medications:  Albuterol (Combivent)  Atorvastatin (  Lipitor)  Calcium Carbonate (Calcium carbonate/Vitamin D)  Carvedilol (Coreg)  Cephalexin (Keflex)  Cetirizine (Zyrtec)  Chondroitin (Glucosamine-Chondroitin)  Ezetimibe (Vytorin)  Fluticasone (Flonase)  Formoterol (Dulera)  Furosemide (Lasix)  Glucosamine (Glucosamine-Chondroitin)  Hydralazine  Ipratropium (Combivent)  Iron  Leuprolide Acetate (Lupron)  Losartan (Cozaar)  Meloxicam (Mobic)  Metformin (Glucophage)  Mometasone (Dulera)  Montelukast (Singulair)  Multivitamin  Nifedipine  Omeprazole  Potassium  Pramipexole (Mirapex)  Simvastatin (Vytorin)  Sitagliptin (Januvia)  Supplement  Vitamin D (Calcium carbonate/Vitamin D) ==================================================================== For clinical consultation, please call [phone]. ====================================================================    Laboratory Chemistry Profile   Renal Lab Results  Component Value Date   BUN  20 09/08/2016   CREATININE 0.75 (L) 09/08/2016   BCR 27 (H) 09/08/2016   GFRAA 97 09/08/2016   GFRNONAA 84 09/08/2016     Hepatic Lab Results  Component Value Date   AST 17 09/08/2016   ALT 15 09/08/2016   ALBUMIN 4.2 09/08/2016   ALKPHOS 64 09/08/2016     Electrolytes Lab Results  Component Value Date   NA 144 09/08/2016   K 3.8 09/08/2016   CL 105 09/08/2016   CALCIUM 9.2 09/08/2016   MG 1.8 09/08/2016     Bone Lab Results  Component Value Date   25OHVITD1 41 09/08/2016   25OHVITD2 <1.0 09/08/2016   25OHVITD3 41 09/08/2016     Inflammation (CRP: Acute Phase) (ESR: Chronic Phase) Lab Results  Component Value Date   CRP 0.7 09/08/2016   ESRSEDRATE 16 09/08/2016       Note: Above Lab results reviewed.  Imaging  Fluoro (C-Arm) (<60 min) (No Report) Fluoro was used, but no Radiologist interpretation will be provided.  Please refer to "NOTES" tab for provider progress note.  Assessment  The primary encounter diagnosis was Chronic pain syndrome. Diagnoses of Chronic low back pain (Primary Area of Pain) (Bilateral) (R>L), Chronic hip pain (Secondary Area of Pain) (Left), Chronic lower extremity pain (Tertiary source of pain) (Bilateral) (L>R), Pharmacologic therapy, and Neurogenic pain were also pertinent to this visit.  Plan of Care  Problem-specific:  No problem-specific Assessment & Plan notes found for this encounter.  Mr. SARA SELVIDGE has a current medication list which includes the following long-term medication(s): albuterol, atorvastatin, ferrous fumarate, fluticasone, [START ON 08/08/2019] gabapentin, montelukast, omeprazole, pramipexole, sitagliptin, torsemide, and tramadol.  Pharmacotherapy (Medications Ordered): Meds ordered this encounter  Medications  . gabapentin (NEURONTIN) 300 MG capsule    Sig: Take 3 capsules (900 mg total) by mouth 3 (three) times daily.    Dispense:  270 capsule    Refill:  5    Fill one day early if pharmacy is closed on  scheduled refill date. May substitute for generic if available.  . traMADol (ULTRAM) 50 MG tablet    Sig: Take 1 tablet (50 mg total) by mouth every other day.    Dispense:  45 tablet    Refill:  1    Chronic Pain: STOP Act (Not applicable) Fill 1 day early if closed on refill date. Do not fill until: 08/02/2019. To last until: 01/29/2020. Avoid benzodiazepines within 8 hours of opioids   Orders:  Orders Placed This Encounter  Procedures  . ToxASSURE Select 13 (MW), Urine    Volume: 30 ml(s). Minimum 3 ml of urine is needed. Document temperature of fresh sample. Indications: Long term (current) use of opiate analgesic (N36.144)    Order Specific Question:   Release to patient    Answer:   Immediate   Follow-up  plan:   Return in about 26 weeks (around 01/31/2020) for (F2F), (MM).      Interventional treatment options: Planned, scheduled, and/or pending:    Under consideration: Diagnostic left femoral + obturator NB Possible left femoral + obturator nerve RFA Diagnostic left L2-3 LESI Diagnostic left L3-4 LESI Diagnosticleft L4-5 LESI Diagnosticleft L2, L3 and L4 TFESI  Diagnostic right L4 and L5 TFESI  Possible bilateral lumbar facet RFA Diagnostic bilateral genicular NB Possible bilateral genicular nerve RFA   Therapeutic/palliative (PRN): Palliative bilateral lumbar facet block#4   Palliativeleft IA hip joint injection #3 Palliative bilateral IA knee injection (steroids) #2  Palliative bilateral IA Hyalgan knee injection #S3N3     Recent Visits Date Type Provider Dept  05/18/19 Procedure visit Milinda Pointer, MD [facility]  Showing recent visits within past 90 days and meeting all other requirements   Today's Visits Date Type Provider Dept  08/02/19 Telemedicine Milinda Pointer, MD [facility]  Showing today's visits and meeting all other requirements   Future Appointments No visits were found meeting these conditions.   Showing future appointments within next 90 days and meeting all other requirements   I discussed the assessment and treatment plan with the patient. The patient was provided an opportunity to ask questions and all were answered. The patient agreed with the plan and demonstrated an understanding of the instructions.  Patient advised to call back or seek an in-person evaluation if the symptoms or condition worsens.  Duration of encounter: 12 minutes.  Note by: Gaspar Cola, MD Date: 08/02/2019; Time: 12:00 PM

## 2019-08-02 ENCOUNTER — Other Ambulatory Visit: Payer: Self-pay

## 2019-08-02 ENCOUNTER — Ambulatory Visit: Payer: Medicare HMO | Attending: Pain Medicine | Admitting: Pain Medicine

## 2019-08-02 DIAGNOSIS — Z79899 Other long term (current) drug therapy: Secondary | ICD-10-CM | POA: Insufficient documentation

## 2019-08-02 DIAGNOSIS — M5442 Lumbago with sciatica, left side: Secondary | ICD-10-CM | POA: Diagnosis not present

## 2019-08-02 DIAGNOSIS — M5441 Lumbago with sciatica, right side: Secondary | ICD-10-CM | POA: Diagnosis not present

## 2019-08-02 DIAGNOSIS — G8929 Other chronic pain: Secondary | ICD-10-CM

## 2019-08-02 DIAGNOSIS — M25552 Pain in left hip: Secondary | ICD-10-CM

## 2019-08-02 DIAGNOSIS — M792 Neuralgia and neuritis, unspecified: Secondary | ICD-10-CM | POA: Diagnosis not present

## 2019-08-02 DIAGNOSIS — G894 Chronic pain syndrome: Secondary | ICD-10-CM

## 2019-08-02 DIAGNOSIS — M79605 Pain in left leg: Secondary | ICD-10-CM | POA: Diagnosis not present

## 2019-08-02 DIAGNOSIS — M79604 Pain in right leg: Secondary | ICD-10-CM | POA: Diagnosis not present

## 2019-08-02 DIAGNOSIS — B351 Tinea unguium: Secondary | ICD-10-CM | POA: Diagnosis not present

## 2019-08-02 DIAGNOSIS — E1142 Type 2 diabetes mellitus with diabetic polyneuropathy: Secondary | ICD-10-CM | POA: Diagnosis not present

## 2019-08-02 MED ORDER — GABAPENTIN 300 MG PO CAPS: 900.0000 mg | ORAL_CAPSULE | Freq: Three times a day (TID) | ORAL | 5 refills | Status: AC

## 2019-08-02 MED ORDER — TRAMADOL HCL 50 MG PO TABS: 50.0000 mg | ORAL_TABLET | ORAL | 1 refills | Status: AC

## 2019-08-19 DIAGNOSIS — R464 Slowness and poor responsiveness: Secondary | ICD-10-CM | POA: Diagnosis not present

## 2019-08-19 DIAGNOSIS — I469 Cardiac arrest, cause unspecified: Secondary | ICD-10-CM | POA: Diagnosis not present

## 2019-08-19 DIAGNOSIS — J9601 Acute respiratory failure with hypoxia: Secondary | ICD-10-CM | POA: Diagnosis not present

## 2019-08-23 ENCOUNTER — Inpatient Hospital Stay
Admission: EM | Admit: 2019-08-23 | Discharge: 2019-09-07 | DRG: 291 | Disposition: E | Payer: Medicare HMO | Attending: Internal Medicine | Admitting: Internal Medicine

## 2019-08-23 ENCOUNTER — Other Ambulatory Visit: Payer: Self-pay

## 2019-08-23 ENCOUNTER — Emergency Department: Payer: Medicare HMO

## 2019-08-23 DIAGNOSIS — Z20822 Contact with and (suspected) exposure to covid-19: Secondary | ICD-10-CM | POA: Diagnosis not present

## 2019-08-23 DIAGNOSIS — Z7982 Long term (current) use of aspirin: Secondary | ICD-10-CM

## 2019-08-23 DIAGNOSIS — I5031 Acute diastolic (congestive) heart failure: Secondary | ICD-10-CM | POA: Diagnosis not present

## 2019-08-23 DIAGNOSIS — E1165 Type 2 diabetes mellitus with hyperglycemia: Secondary | ICD-10-CM | POA: Diagnosis present

## 2019-08-23 DIAGNOSIS — R531 Weakness: Secondary | ICD-10-CM | POA: Diagnosis not present

## 2019-08-23 DIAGNOSIS — I469 Cardiac arrest, cause unspecified: Secondary | ICD-10-CM | POA: Diagnosis not present

## 2019-08-23 DIAGNOSIS — I440 Atrioventricular block, first degree: Secondary | ICD-10-CM | POA: Diagnosis present

## 2019-08-23 DIAGNOSIS — R918 Other nonspecific abnormal finding of lung field: Secondary | ICD-10-CM | POA: Diagnosis not present

## 2019-08-23 DIAGNOSIS — D649 Anemia, unspecified: Secondary | ICD-10-CM | POA: Diagnosis not present

## 2019-08-23 DIAGNOSIS — R0602 Shortness of breath: Secondary | ICD-10-CM | POA: Diagnosis not present

## 2019-08-23 DIAGNOSIS — I110 Hypertensive heart disease with heart failure: Secondary | ICD-10-CM | POA: Diagnosis not present

## 2019-08-23 DIAGNOSIS — I10 Essential (primary) hypertension: Secondary | ICD-10-CM | POA: Diagnosis not present

## 2019-08-23 DIAGNOSIS — R0902 Hypoxemia: Secondary | ICD-10-CM | POA: Diagnosis not present

## 2019-08-23 DIAGNOSIS — Z794 Long term (current) use of insulin: Secondary | ICD-10-CM

## 2019-08-23 DIAGNOSIS — Z7984 Long term (current) use of oral hypoglycemic drugs: Secondary | ICD-10-CM

## 2019-08-23 DIAGNOSIS — I5033 Acute on chronic diastolic (congestive) heart failure: Secondary | ICD-10-CM | POA: Diagnosis present

## 2019-08-23 DIAGNOSIS — J9601 Acute respiratory failure with hypoxia: Secondary | ICD-10-CM | POA: Diagnosis not present

## 2019-08-23 DIAGNOSIS — R5381 Other malaise: Secondary | ICD-10-CM | POA: Diagnosis not present

## 2019-08-23 DIAGNOSIS — N179 Acute kidney failure, unspecified: Secondary | ICD-10-CM | POA: Diagnosis present

## 2019-08-23 DIAGNOSIS — Z79891 Long term (current) use of opiate analgesic: Secondary | ICD-10-CM | POA: Diagnosis not present

## 2019-08-23 DIAGNOSIS — J45909 Unspecified asthma, uncomplicated: Secondary | ICD-10-CM | POA: Diagnosis present

## 2019-08-23 DIAGNOSIS — Z811 Family history of alcohol abuse and dependence: Secondary | ICD-10-CM | POA: Diagnosis not present

## 2019-08-23 DIAGNOSIS — G4733 Obstructive sleep apnea (adult) (pediatric): Secondary | ICD-10-CM | POA: Diagnosis present

## 2019-08-23 DIAGNOSIS — Z79899 Other long term (current) drug therapy: Secondary | ICD-10-CM

## 2019-08-23 DIAGNOSIS — I509 Heart failure, unspecified: Secondary | ICD-10-CM

## 2019-08-23 DIAGNOSIS — J90 Pleural effusion, not elsewhere classified: Secondary | ICD-10-CM | POA: Diagnosis not present

## 2019-08-23 DIAGNOSIS — Z66 Do not resuscitate: Secondary | ICD-10-CM | POA: Diagnosis not present

## 2019-08-23 DIAGNOSIS — R0689 Other abnormalities of breathing: Secondary | ICD-10-CM | POA: Diagnosis not present

## 2019-08-23 DIAGNOSIS — Z8546 Personal history of malignant neoplasm of prostate: Secondary | ICD-10-CM

## 2019-08-23 LAB — COMPREHENSIVE METABOLIC PANEL
ALT: 35 U/L (ref 0–44)
AST: 40 U/L (ref 15–41)
Albumin: 3.1 g/dL — ABNORMAL LOW (ref 3.5–5.0)
Alkaline Phosphatase: 48 U/L (ref 38–126)
Anion gap: 8 (ref 5–15)
BUN: 36 mg/dL — ABNORMAL HIGH (ref 8–23)
CO2: 27 mmol/L (ref 22–32)
Calcium: 8.4 mg/dL — ABNORMAL LOW (ref 8.9–10.3)
Chloride: 106 mmol/L (ref 98–111)
Creatinine, Ser: 1.31 mg/dL — ABNORMAL HIGH (ref 0.61–1.24)
GFR calc Af Amer: 56 mL/min — ABNORMAL LOW (ref >60)
GFR calc non Af Amer: 49 mL/min — ABNORMAL LOW (ref >60)
Glucose, Bld: 127 mg/dL — ABNORMAL HIGH (ref 70–99)
Potassium: 4 mmol/L (ref 3.5–5.1)
Sodium: 141 mmol/L (ref 135–145)
Total Bilirubin: 0.9 mg/dL (ref 0.3–1.2)
Total Protein: 6.4 g/dL — ABNORMAL LOW (ref 6.5–8.1)

## 2019-08-23 LAB — CBC WITH DIFFERENTIAL/PLATELET
Abs Immature Granulocytes: 0.02 10*3/uL (ref 0.00–0.07)
Basophils Absolute: 0 10*3/uL (ref 0.0–0.1)
Basophils Relative: 1 %
Eosinophils Absolute: 0.2 10*3/uL (ref 0.0–0.5)
Eosinophils Relative: 3 %
HCT: 33.9 % — ABNORMAL LOW (ref 39.0–52.0)
Hemoglobin: 10.7 g/dL — ABNORMAL LOW (ref 13.0–17.0)
Immature Granulocytes: 0 %
Lymphocytes Relative: 20 %
Lymphs Abs: 1.4 10*3/uL (ref 0.7–4.0)
MCH: 30.9 pg (ref 26.0–34.0)
MCHC: 31.6 g/dL (ref 30.0–36.0)
MCV: 98 fL (ref 80.0–100.0)
Monocytes Absolute: 0.8 10*3/uL (ref 0.1–1.0)
Monocytes Relative: 12 %
Neutro Abs: 4.4 10*3/uL (ref 1.7–7.7)
Neutrophils Relative %: 64 %
Platelets: 213 10*3/uL (ref 150–400)
RBC: 3.46 MIL/uL — ABNORMAL LOW (ref 4.22–5.81)
RDW: 16.2 % — ABNORMAL HIGH (ref 11.5–15.5)
WBC: 6.8 10*3/uL (ref 4.0–10.5)
nRBC: 0 % (ref 0.0–0.2)

## 2019-08-23 LAB — CBC
HCT: 34.7 % — ABNORMAL LOW (ref 39.0–52.0)
Hemoglobin: 10.9 g/dL — ABNORMAL LOW (ref 13.0–17.0)
MCH: 30.6 pg (ref 26.0–34.0)
MCHC: 31.4 g/dL (ref 30.0–36.0)
MCV: 97.5 fL (ref 80.0–100.0)
Platelets: 223 10*3/uL (ref 150–400)
RBC: 3.56 MIL/uL — ABNORMAL LOW (ref 4.22–5.81)
RDW: 16.1 % — ABNORMAL HIGH (ref 11.5–15.5)
WBC: 7.4 10*3/uL (ref 4.0–10.5)
nRBC: 0 % (ref 0.0–0.2)

## 2019-08-23 LAB — TROPONIN I (HIGH SENSITIVITY)
Troponin I (High Sensitivity): 34 ng/L — ABNORMAL HIGH (ref <18)
Troponin I (High Sensitivity): 40 ng/L — ABNORMAL HIGH (ref <18)

## 2019-08-23 LAB — MAGNESIUM: Magnesium: 2 mg/dL (ref 1.7–2.4)

## 2019-08-23 LAB — CREATININE, SERUM
Creatinine, Ser: 1.3 mg/dL — ABNORMAL HIGH (ref 0.61–1.24)
GFR calc Af Amer: 57 mL/min — ABNORMAL LOW (ref >60)
GFR calc non Af Amer: 49 mL/min — ABNORMAL LOW (ref >60)

## 2019-08-23 LAB — GLUCOSE, CAPILLARY: Glucose-Capillary: 103 mg/dL — ABNORMAL HIGH (ref 70–99)

## 2019-08-23 LAB — PROCALCITONIN: Procalcitonin: <0.1 ng/mL

## 2019-08-23 LAB — BRAIN NATRIURETIC PEPTIDE: B Natriuretic Peptide: 859.8 pg/mL — ABNORMAL HIGH (ref 0.0–100.0)

## 2019-08-23 LAB — SARS CORONAVIRUS 2 BY RT PCR (HOSPITAL ORDER, PERFORMED IN ~~LOC~~ HOSPITAL LAB): SARS Coronavirus 2: NEGATIVE

## 2019-08-23 MED ORDER — ASPIRIN EC 81 MG PO TBEC
81.0000 mg | DELAYED_RELEASE_TABLET | Freq: Every day | ORAL | Status: DC
  Administered 2019-08-24: 81 mg via ORAL
  Filled 2019-08-23: qty 1

## 2019-08-23 MED ORDER — HYDRALAZINE HCL 50 MG PO TABS
50.0000 mg | ORAL_TABLET | Freq: Three times a day (TID) | ORAL | Status: DC
  Administered 2019-08-23 – 2019-08-24 (×3): 50 mg via ORAL
  Filled 2019-08-23 (×5): qty 1

## 2019-08-23 MED ORDER — ACETAMINOPHEN 325 MG PO TABS: 325.0000 mg | ORAL_TABLET | Freq: Four times a day (QID) | ORAL | Status: DC | PRN

## 2019-08-23 MED ORDER — FUROSEMIDE 10 MG/ML IJ SOLN: 40.0000 mg | Freq: Once | INTRAMUSCULAR | Status: DC

## 2019-08-23 MED ORDER — INSULIN ASPART 100 UNIT/ML ~~LOC~~ SOLN: 0.0000 [IU] | Freq: Every day | SUBCUTANEOUS | Status: DC

## 2019-08-23 MED ORDER — PRAMIPEXOLE DIHYDROCHLORIDE 0.25 MG PO TABS
0.2500 mg | ORAL_TABLET | Freq: Every evening | ORAL | Status: DC | PRN
  Filled 2019-08-23 (×2): qty 1

## 2019-08-23 MED ORDER — ALBUTEROL SULFATE (2.5 MG/3ML) 0.083% IN NEBU
3.0000 mL | INHALATION_SOLUTION | RESPIRATORY_TRACT | Status: DC | PRN
  Administered 2019-08-24: 3 mL via RESPIRATORY_TRACT
  Filled 2019-08-23: qty 3

## 2019-08-23 MED ORDER — FUROSEMIDE 10 MG/ML IJ SOLN
40.0000 mg | Freq: Two times a day (BID) | INTRAMUSCULAR | Status: DC
  Administered 2019-08-23 – 2019-08-24 (×3): 40 mg via INTRAVENOUS
  Filled 2019-08-23 (×3): qty 4

## 2019-08-23 MED ORDER — LOSARTAN POTASSIUM 50 MG PO TABS
100.0000 mg | ORAL_TABLET | Freq: Every day | ORAL | Status: DC
  Administered 2019-08-24: 100 mg via ORAL
  Filled 2019-08-23: qty 2

## 2019-08-23 MED ORDER — PANTOPRAZOLE SODIUM 40 MG PO TBEC
40.0000 mg | DELAYED_RELEASE_TABLET | Freq: Every day | ORAL | Status: DC
  Administered 2019-08-23 – 2019-08-24 (×2): 40 mg via ORAL
  Filled 2019-08-23 (×2): qty 1

## 2019-08-23 MED ORDER — ATORVASTATIN CALCIUM 20 MG PO TABS
40.0000 mg | ORAL_TABLET | Freq: Every day | ORAL | Status: DC
  Administered 2019-08-24: 40 mg via ORAL
  Filled 2019-08-23: qty 2

## 2019-08-23 MED ORDER — INSULIN ASPART 100 UNIT/ML ~~LOC~~ SOLN
0.0000 [IU] | Freq: Three times a day (TID) | SUBCUTANEOUS | Status: DC
  Administered 2019-08-24: 3 [IU] via SUBCUTANEOUS
  Filled 2019-08-23: qty 1

## 2019-08-23 MED ORDER — ENOXAPARIN SODIUM 40 MG/0.4ML ~~LOC~~ SOLN
40.0000 mg | SUBCUTANEOUS | Status: DC
  Administered 2019-08-23 – 2019-08-24 (×2): 40 mg via SUBCUTANEOUS
  Filled 2019-08-23 (×2): qty 0.4

## 2019-08-23 MED ORDER — FERROUS FUMARATE 324 (106 FE) MG PO TABS
1.0000 | ORAL_TABLET | Freq: Two times a day (BID) | ORAL | Status: DC
  Administered 2019-08-24 (×2): 106 mg via ORAL
  Filled 2019-08-23 (×4): qty 1

## 2019-08-23 MED ORDER — CARVEDILOL 12.5 MG PO TABS
12.5000 mg | ORAL_TABLET | Freq: Two times a day (BID) | ORAL | Status: DC
  Administered 2019-08-24 (×2): 12.5 mg via ORAL
  Filled 2019-08-23 (×2): qty 1

## 2019-08-23 MED ORDER — GABAPENTIN 300 MG PO CAPS
900.0000 mg | ORAL_CAPSULE | Freq: Three times a day (TID) | ORAL | Status: DC
  Administered 2019-08-23 – 2019-08-24 (×4): 900 mg via ORAL
  Filled 2019-08-23 (×4): qty 3

## 2019-08-23 NOTE — H&P (Signed)
Joshua Humphrey is an 86 y.o. male.    Status is: Inpatient  Remains inpatient appropriate because:Inpatient level of care appropriate due to severity of illness   Dispo: The patient is from: ALF              Anticipated d/c is to: ALF              Anticipated d/c date is: 3 days              Patient currently is not medically stable to d/c.   Chief Complaint: Shortness of breath. HPI:   Patient is a 86 year old male with history of type 2 diabetes, prostate cancer, essential hypertension, obstructive sleep apnea who present to the hospital with a short of breath and cough. Patient currently lives in assisted living facility by himself.  He is independent of all ADLs.  But he has some short of breath with the exertion, he use a walker to walk.  He does not have paroxysmal nocturnal dyspnea or orthopnea.  He did not have any weight gain.  But he has some chronic leg edema.  Symptoms started about 3 days ago, when he started have worsening short of breath with exertion.  He can only walk a few steps.  Still has no paroxysmal nocturnal dyspnea.  He still sleeps flat in the nighttime.  However, leg edema seem to be worse.  He also has a cough, with some white mucus.  No fever or chills.  Upon arriving the emergency room, he had a profound elevation of BNP, chest x-ray showed pulmonary edema bilaterally.  He was also placed on 4 L oxygen for hypoxemia.  He was given 40 mg of IV Lasix.   Past Medical History:  Diagnosis Date   Anemia    Asthma    Cancer ([place])    prostate   Cancer ([place])    skin    Diabetes mellitus without complication ([place])    GERD (gastroesophageal reflux disease)    Hypertension    PONV (postoperative nausea and vomiting)    Respiratory infection 08/23/2017   Septic olecranon bursitis of left elbow 01/22/2016   Sleep apnea     Past Surgical History:  Procedure Laterality Date   CATARACT EXTRACTION, BILATERAL     ELBOW SURGERY     EYE SURGERY      lipoma removal  2000   back    OLECRANON BURSECTOMY Left 01/21/2016   Procedure: OLECRANON BURSA;  Surgeon: Corky Mull, MD;  Location: [facility] ORS;  Service: Orthopedics;  Laterality: Left;   PROSTATE SURGERY  2002   TOE SURGERY     TONSILLECTOMY     tumor removed  2000    Family History  Problem Relation Age of Onset   Cancer Mother    Alcohol abuse Father    Cancer Brother    Social History:  reports that he has never smoked. He has never used smokeless tobacco. He reports current alcohol use. He reports that he does not use drugs.  Allergies: No Known Allergies  (Not in a hospital admission)   Results for orders placed or performed during the hospital encounter of 09/05/2019 (from the past 48 hour(s))  CBC with Differential     Status: Abnormal   Collection Time: 09/02/2019  4:04 PM  Result Value Ref Range   WBC 6.8 4.0 - 10.5 K/uL   RBC 3.46 (L) 4.22 - 5.81 MIL/uL   Hemoglobin 10.7 (L) 13.0 - 17.0 g/dL  HCT 33.9 (L) 39 - 52 %   MCV 98.0 80.0 - 100.0 fL   MCH 30.9 26.0 - 34.0 pg   MCHC 31.6 30.0 - 36.0 g/dL   RDW 16.2 (H) 11.5 - 15.5 %   Platelets 213 150 - 400 K/uL   nRBC 0.0 0.0 - 0.2 %   Neutrophils Relative % 64 %   Neutro Abs 4.4 1.7 - 7.7 K/uL   Lymphocytes Relative 20 %   Lymphs Abs 1.4 0.7 - 4.0 K/uL   Monocytes Relative 12 %   Monocytes Absolute 0.8 0 - 1 K/uL   Eosinophils Relative 3 %   Eosinophils Absolute 0.2 0 - 0 K/uL   Basophils Relative 1 %   Basophils Absolute 0.0 0 - 0 K/uL   Immature Granulocytes 0 %   Abs Immature Granulocytes 0.02 0.00 - 0.07 K/uL    Comment: Performed at [facility], [address], [place], [place] [address]  Comprehensive metabolic panel     Status: Abnormal   Collection Time: 08/29/2019  4:04 PM  Result Value Ref Range   Sodium 141 135 - 145 mmol/L   Potassium 4.0 3.5 - 5.1 mmol/L   Chloride 106 98 - 111 mmol/L   CO2 27 22 - 32 mmol/L   Glucose, Bld 127 (H) 70 - 99 mg/dL    Comment: Glucose reference  range applies only to samples taken after fasting for at least 8 hours.   BUN 36 (H) 8 - 23 mg/dL   Creatinine, Ser 1.31 (H) 0.61 - 1.24 mg/dL   Calcium 8.4 (L) 8.9 - 10.3 mg/dL   Total Protein 6.4 (L) 6.5 - 8.1 g/dL   Albumin 3.1 (L) 3.5 - 5.0 g/dL   AST 40 15 - 41 U/L   ALT 35 0 - 44 U/L   Alkaline Phosphatase 48 38 - 126 U/L   Total Bilirubin 0.9 0.3 - 1.2 mg/dL   GFR calc non Af Amer 49 (L) >60 mL/min   GFR calc Af Amer 56 (L) >60 mL/min   Anion gap 8 5 - 15    Comment: Performed at [facility], [address] [place], [place] [address]  Brain natriuretic peptide     Status: Abnormal   Collection Time: 09/02/2019  4:04 PM  Result Value Ref Range   B Natriuretic Peptide 859.8 (H) 0.0 - 100.0 pg/mL    Comment: Performed at [facility], [place], [place] [address]  Troponin I (High Sensitivity)     Status: Abnormal   Collection Time: 08/08/2019  4:04 PM  Result Value Ref Range   Troponin I (High Sensitivity) 34 (H) <18 ng/L    Comment: (NOTE) Elevated high sensitivity troponin I (hsTnI) values and significant  changes across serial measurements may suggest ACS but many other  chronic and acute conditions are known to elevate hsTnI results.  Refer to the "Links" section for chest pain algorithms and additional  guidance. Performed at [facility], [address] [place], [place] [address]   Procalcitonin - Baseline     Status: None   Collection Time: 08/09/2019  4:04 PM  Result Value Ref Range   Procalcitonin <0.10 ng/mL    Comment:        Interpretation: PCT (Procalcitonin) <= 0.5 ng/mL: Systemic infection (sepsis) is not likely. Local bacterial infection is possible. (NOTE)       Sepsis PCT Algorithm           Lower Respiratory  Tract                                      Infection PCT Algorithm    ----------------------------     ----------------------------         PCT < 0.25 ng/mL                PCT < 0.10 ng/mL           Strongly encourage             Strongly discourage   discontinuation of antibiotics    initiation of antibiotics    ----------------------------     -----------------------------       PCT 0.25 - 0.50 ng/mL            PCT 0.10 - 0.25 ng/mL               OR       >80% decrease in PCT            Discourage initiation of                                            antibiotics      Encourage discontinuation           of antibiotics    ----------------------------     -----------------------------         PCT >= 0.50 ng/mL              PCT 0.26 - 0.50 ng/mL               AND        <80% decrease in PCT             Encourage initiation of                                             antibiotics       Encourage continuation           of antibiotics    ----------------------------     -----------------------------        PCT >= 0.50 ng/mL                  PCT > 0.50 ng/mL               AND         increase in PCT                  Strongly encourage                                      initiation of antibiotics    Strongly encourage escalation           of antibiotics                                     -----------------------------  PCT <= 0.25 ng/mL                                                 OR                                        > 80% decrease in PCT                                      Discontinue / Do not initiate                                             antibiotics  Performed at [facility], [address] [place], [place] [address]   SARS Coronavirus 2 by RT PCR (hospital order, performed in [facility] hospital lab) Nasopharyngeal Nasopharyngeal Swab     Status: None   Collection Time: 08/18/2019  4:04 PM   Specimen: Nasopharyngeal Swab  Result Value Ref Range   SARS Coronavirus 2 NEGATIVE NEGATIVE    Comment: (NOTE) SARS-CoV-2 target nucleic acids are NOT DETECTED.  The SARS-CoV-2 RNA is generally detectable in  upper and lower respiratory specimens during the acute phase of infection. The lowest concentration of SARS-CoV-2 viral copies this assay can detect is 250 copies / mL. A negative result does not preclude SARS-CoV-2 infection and should not be used as the sole basis for treatment or other patient management decisions.  A negative result may occur with improper specimen collection / handling, submission of specimen other than nasopharyngeal swab, presence of viral mutation(s) within the areas targeted by this assay, and inadequate number of viral copies (<250 copies / mL). A negative result must be combined with clinical observations, patient history, and epidemiological information.  Fact Sheet for Patients:   StrictlyIdeas.no  Fact Sheet for Healthcare Providers: BankingDealers.co.za  This test is not yet approved or  cleared by the [place] FDA and has been authorized for detection and/or diagnosis of SARS-CoV-2 by FDA under an Emergency Use Authorization (EUA).  This EUA will remain in effect (meaning this test can be used) for the duration of the COVID-19 declaration under Section 564(b)(1) of the Act, 21 U.S.C. section 360bbb-3(b)(1), unless the authorization is terminated or revoked sooner.  Performed at [facility], [address] [place], [place] [address]   Magnesium     Status: None   Collection Time: 08/18/2019  4:04 PM  Result Value Ref Range   Magnesium 2.0 1.7 - 2.4 mg/dL    Comment: Performed at [facility], [address] [place], [place] [address]   DG Chest Portable 1 View  Result Date: 08/15/2019 CLINICAL DATA:  Shortness of breath EXAM: PORTABLE CHEST 1 VIEW COMPARISON:  None. FINDINGS: Diffuse hazy interstitial opacities with central vascular congestion, with septal and fissural thickening. Findings on a background of low volumes and atelectatic changes. Suspect a trace left effusion as  well. Prominent cardiomediastinal silhouette though possibly accentuated by portable technique and low volumes. The aorta is calcified. The remaining cardiomediastinal contours are unremarkable. No acute osseous  or soft tissue abnormality. Degenerative changes are present in the imaged spine and shoulders. Telemetry leads overlie the chest. IMPRESSION: Features most compatible with CHF/volume overload with edema, possible cardiomegaly and trace left effusion. Electronically Signed   By: Lovena Le M.D.   On: 08/23/2019 16:22    Review of Systems  Constitutional: Positive for activity change. Negative for appetite change, chills, diaphoresis, fever and unexpected weight change.  HENT: Negative for congestion, postnasal drip and rhinorrhea.   Eyes: Negative for pain and discharge.  Respiratory: Positive for cough and shortness of breath. Negative for apnea, choking and wheezing.   Cardiovascular: Positive for leg swelling. Negative for chest pain and palpitations.  Gastrointestinal: Negative for constipation, diarrhea, nausea and vomiting.  Endocrine: Negative for cold intolerance and heat intolerance.  Genitourinary: Negative for dysuria, flank pain and hematuria.  Musculoskeletal: Negative for back pain and gait problem.  Allergic/Immunologic: Negative for environmental allergies and food allergies.  Neurological: Negative for dizziness, syncope and headaches.  Psychiatric/Behavioral: Negative for agitation, confusion and hallucinations.    Blood pressure (!) 151/87, pulse 68, temperature 98.4 F (36.9 C), temperature source Oral, resp. rate (!) 21, height 5\' 9"  (1.753 m), weight 96.2 kg, SpO2 95 %. Physical Exam  Constitutional: He is oriented to person, place, and time.  Non-toxic appearance. He appears ill. No distress.  HENT:  Head: Normocephalic and atraumatic.  Mouth/Throat: Mucous membranes are moist. Oropharynx is clear.  Eyes: Pupils are equal, round, and reactive to light.   Neck: No JVD present. No tracheal deviation present. No thyromegaly present.  Cardiovascular: Normal rate and regular rhythm. Exam reveals no gallop and no friction rub.  No murmur heard. Respiratory: Effort normal. No tachypnea. No respiratory distress. He has rhonchi. He has rales. He exhibits no tenderness and no deformity.  GI: Soft. Bowel sounds are normal. There is no splenomegaly or hepatomegaly. There is no rebound and no guarding.  Musculoskeletal:        General: Normal range of motion.     Cervical back: Normal range of motion and neck supple.     Right lower leg: Edema present.     Left lower leg: Edema present.  Neurological: He is alert and oriented to person, place, and time. He displays no weakness. No cranial nerve deficit.  Skin: Skin is warm and dry. He is not diaphoretic.  Psychiatric: Mood normal. He is not agitated.     Assessment/Plan #1.  Acute hypoxemic respiratory failure.  Secondary to acute exacerbation of congestive heart failure. Patient does not have a chronic hypoxemia.  No evidence of pneumonia.  I will continue oxygen treatment.  2.  Acute on chronic congestive heart failure, presumed to be diastolic congestive heart failure. Patient has evidence of volume overload.  He has a worsening short of breath and leg edema.  Elevation of a BMP, pulmonary edema on chest x-ray.  He has received IV Lasix in the emergency room, I will continue 40 mg IV Lasix twice a day.  Monitor electrolytes and renal function.  3.  Obstructive sleep apnea. We will place on CPAP while asleep.  4.  Uncontrolled type 2 diabetes with hyperglycemia on long-term insulin use. We will start sliding scale insulin for now, monitor glucose.  May need to start Lantus if glucose running high.  Check hemoglobin A1c.  5.  Anemia. Check iron B12 level.  6.  Acute kidney injury. Reviewed previous lab, patient does not seem to have any chronic kidney disease.  We will continue  to follow.  7.   CODE STATUS. Discussed with the patient about his CODE STATUS, he is DO NOT RESUSCITATE status.  Sharen Hones, MD 08/10/2019, 5:31 PM

## 2019-08-23 NOTE — ED Provider Notes (Addendum)
[facility] Emergency Department Provider Note  ____________________________________________   None    (approximate)  I have reviewed the triage vital signs and the nursing notes.   HISTORY  Chief Complaint Weakness and Shortness of Breath    HPI Joshua Humphrey is a 86 y.o. male with diabetes, hypertension, sleep apnea who comes in for weakness.  Patient was out to eat with his daughter when he started to feel weak.  Patient did not fall over and did not lose consciousness but felt like he was about to.  On EMS arrival he had saturations in the low 80s.  Patient was placed on a nonrebreather for transport.  Patient does report having a cough for the past few days, intermittent, nothing makes better, nothing makes it worse.  A little bit of shortness of breath.  Denies any chest pain, abdominal pain.  Does have some chronic leg swelling that he says is baseline for him.  Denies a history of PE, unilateral leg swelling, immobilization, recent surgery.Marland Kitchen  He denies any recent falls.  Patient lives at assisted living at [facility].           Past Medical History:  Diagnosis Date  . Anemia   . Asthma   . Cancer [facility])    prostate  . Cancer ([place])    skin   . Diabetes mellitus without complication ([place])   . GERD (gastroesophageal reflux disease)   . Hypertension   . PONV (postoperative nausea and vomiting)   . Respiratory infection 08/23/2017  . Septic olecranon bursitis of left elbow 01/22/2016  . Sleep apnea     Patient Active Problem List   Diagnosis Date Noted  . Pharmacologic therapy 08/02/2019  . Personal history of prostate cancer 05/19/2019  . Chronic knee pain (Right) 02/28/2019  . Osteoarthritis of knee (Right) 02/28/2019  . Aortic stenosis 10/27/2017  . Chronic diastolic CHF (congestive heart failure) ([place]) 10/27/2017  . Uses walker 10/27/2017  . Edema extremities 10/12/2017  . DDD (degenerative disc disease), lumbar 09/02/2017  .  Weakness of lower extremities (Bilateral) 09/02/2017  . Spondylosis without myelopathy or radiculopathy, lumbosacral region 07/15/2017  . Lumbar spondylosis 06/30/2017  . Ataxia 04/28/2017  . Lumbar facet joint osteoarthritis (Bilateral) 04/08/2017  . Osteoarthritis of knee (Bilateral) 12/22/2016  . Abnormal knee x-ray (2017) 12/22/2016  . Lumbar facet syndrome (Bilateral) (R>L) 11/04/2016  . Osteoarthritis of lumbar spine 11/04/2016  . Cervical spinal stenosis 11/03/2016  . Cervical foraminal stenosis 11/03/2016  . Osteoarthritis of hip (Left) 10/15/2016  . Chronic hip arthralgia (Left) 10/15/2016  . Vitamin D insufficiency 10/15/2016  . Osteoarthritis 10/08/2016  . Lumbar foraminal stenosis (Right: L4-5 and L5-S1) (Left: L2-3, L3-4, and L4-5) 10/08/2016  . Neurogenic pain 10/05/2016  . Disturbance of skin sensation 09/08/2016  . Chronic hip pain (Secondary Area of Pain) (Left) 09/08/2016  . Chronic knee pain (Bilateral) (L>R) 09/08/2016  . Generalized weakness 09/08/2016  . Muscle weakness (generalized) 09/08/2016  . Chronic Lumbar radicular pain (Left) (L5) 09/08/2016  . Cortical senile cataract 09/06/2016  . Chronic pain syndrome 09/06/2016  . Long term (current) use of opiate analgesic 09/06/2016  . Long term prescription opiate use 09/06/2016  . Opiate use (30 MME/Day) 09/06/2016  . Chronic low back pain (Primary Area of Pain) (Bilateral) (R>L) 09/06/2016  . Chronic lower extremity pain [facility] source of pain) (Bilateral) (L>R) 09/06/2016  . Abnormal MRI, lumbar spine 09/06/2016  . Abnormal MRI, cervical spine 09/06/2016  . Lumbar spinal stenosis  with neurogenic claudication 09/06/2016  . Health care maintenance 03/31/2016  . Mass of subcutaneous tissue of back 03/06/2016  . Scoliosis (and kyphoscoliosis), idiopathic 10/22/2015  . Lumbar central spinal stenosis (Severe:L4-5; Mild:L3-4; Moderate:L2-3) 10/22/2015  . Lipoma of neck 01/09/2014  . Post-operative state  12/31/2011  . Restless leg syndrome 12/30/2011  . Allergic rhinitis 11/25/2011  . Anemia 11/25/2011  . Asthma 11/25/2011  . GERD (gastroesophageal reflux disease) 11/25/2011  . Hyperlipidemia 11/25/2011  . Hypertension 11/25/2011  . Obesity 11/25/2011  . Obstructive sleep apnea 11/25/2011  . Prostate cancer ([place]) 11/25/2011  . Cataract 10/29/2011  . 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 CATARACT EXTRACTION, BILATERAL    . ELBOW SURGERY    . EYE SURGERY    . lipoma removal  2000   back   . OLECRANON BURSECTOMY Left 01/21/2016   Procedure: OLECRANON BURSA;  Surgeon: Corky Mull, MD;  Location: [facility] ORS;  Service: Orthopedics;  Laterality: Left;  . PROSTATE SURGERY  2002  . TOE SURGERY    . TONSILLECTOMY    . tumor removed  2000    Prior to Admission medications   Medication Sig Start Date End Date Taking? Authorizing Provider  acetaminophen (TYLENOL) 650 MG CR tablet Take 650-1,300 mg by mouth every 8 (eight) hours as needed (for pain.).     [provider]  albuterol (PROVENTIL HFA;VENTOLIN HFA) 108 (90 Base) MCG/ACT inhaler 2 puffs q.i.d. p.r.n. short of breath, wheezing, or cough 02/18/18   [provider]  aspirin EC 81 MG tablet Take 81 mg by mouth daily.     [provider]  atorvastatin (LIPITOR) 40 MG tablet Take 40 mg by mouth daily.  11/06/15   [provider]  B-D ULTRAFINE III SHORT PEN 31G X 8 MM MISC USE BID UTD 12/23/16   [provider]  benzonatate (TESSALON) 200 MG capsule Take 200 mg by mouth as needed. 06/22/17   [provider]  Calcium Carbonate-Vitamin D (CALTRATE 600+D PO) Take 2 tablets by mouth daily.    [provider]  carvedilol (COREG) 25 MG tablet TAKE 1 TABLET (25 MG) TWICE DAILY 09/05/15   [provider]  cetirizine (ZYRTEC) 10 MG tablet Take 10 mg by mouth every evening.      [provider]  ferrous fumarate (HEMOCYTE - 106 MG FE) 325 (106 FE) MG TABS tablet Take 1 tablet by mouth 2 (two) times daily.     [provider]  FLAXSEED, LINSEED, PO Take by mouth daily.    [provider]  fluticasone (FLONASE) 50 MCG/ACT nasal spray Place 1-2 sprays into both nostrils daily as needed (for allergies.).  11/06/15   [provider]  gabapentin (NEURONTIN) 300 MG capsule Take 3 capsules (900 mg total) by mouth 3 (three) times daily. 08/08/19 02/04/20  Milinda Pointer, MD  Glucosamine-Chondroit-Vit C-Mn (GLUCOSAMINE-CHONDROITIN) CAPS Take 2 capsules by mouth daily.    [provider]  hydrALAZINE (APRESOLINE) 50 MG tablet Take 50 mg by mouth 3 (three) times daily.  12/13/15   [provider]  Ipratropium-Albuterol (COMBIVENT RESPIMAT) 20-100 MCG/ACT AERS respimat INHALE 2 PUFF FOUR TIMES DAILY AS NEEDED FOR SHORTNESS OF BREATH AND/OR WHEEZING. 10/30/11   [provider]  LEVEMIR FLEXTOUCH 100 UNIT/ML Pen Inject 15 Units into the skin every evening.  01/07/16   [provider]  losartan (COZAAR) 100 MG tablet Take  100 mg by mouth daily.     [provider]  magnesium oxide (MAG-OX) 400 MG tablet Take 400 mg by mouth daily.    [provider]  metFORMIN (GLUCOPHAGE) 500 MG tablet Take 500 mg by mouth 2 (two) times daily with a meal.    [provider]  montelukast (SINGULAIR) 10 MG tablet Take 10 mg by mouth at bedtime.    [provider]  Multiple Vitamins-Minerals (OCUVITE ADULT 50+) CAPS Take 1 capsule by mouth daily.     [provider]  NIFEdipine (PROCARDIA XL/ADALAT-CC) 90 MG 24 hr tablet Take 90 mg by mouth daily.  12/30/15   [provider]  omeprazole (PRILOSEC) 20 MG capsule Take 20 mg by mouth daily.    [provider]  potassium chloride SA (K-DUR,KLOR-CON) 20 MEQ tablet TAKE 1 TABLET(20 MEQ) BY MOUTH TWICE DAILY 11/30/16   [provider]  pramipexole (MIRAPEX) 0.25 MG tablet Take 0.25 mg by mouth at bedtime as needed (for restless leg syndrome).     [provider]  sitaGLIPtin (JANUVIA) 100 MG tablet Take 100 mg by mouth daily.    [provider]  torsemide (DEMADEX) 20 MG tablet Take by mouth. 10/12/17 10/12/18  [provider]  traMADol (ULTRAM) 50 MG tablet Take 1 tablet (50 mg total) by mouth every other day. 08/02/19 01/29/20  Milinda Pointer, MD    Allergies Patient has no known allergies.  Family History  Problem Relation Age of Onset  . Cancer Mother   . Alcohol abuse Father   . Cancer Brother     Social History Social History   Tobacco Use  . Smoking status: Never Smoker  . Smokeless tobacco: Never Used  Substance Use Topics  . Alcohol use: Yes    Alcohol/week: 0.0 standard drinks  . Drug use: No      Review of Systems Constitutional: No fever/chills Eyes: No visual changes. ENT: No sore throat. Cardiovascular: No chest pain Respiratory: Positive for SOB, cough  Gastrointestinal: No abdominal pain.  No nausea, no vomiting.  No diarrhea.  No constipation. Genitourinary: Negative for dysuria. Musculoskeletal: Negative for back pain. Skin: Negative for rash. Neurological: Negative for headaches, focal weakness or numbness. All other ROS negative ____________________________________________   PHYSICAL EXAM:  VITAL SIGNS: ED Triage Vitals  Blood pressure (!) 151/87, pulse 68, temperature 98.4 F (36.9 C), temperature source Oral, resp. rate (!) 21, height 5\' 9"  (1.753 m), weight 96.2 kg, SpO2 95 %.   Constitutional: Alert and oriented. Well appearing and in no acute distress. Eyes: Conjunctivae are normal. EOMI. Head: Atraumatic. Nose: No congestion/rhinnorhea. Mouth/Throat: Mucous membranes are moist.   Neck: No stridor. Trachea Midline. FROM Cardiovascular: Normal rate, regular rhythm. Grossly normal heart sounds.  Good peripheral  circulation. Respiratory: No increased work of breathing, on 4-1/2 L nasal cannula, crackles Gastrointestinal: Soft and nontender. No distention. No abdominal bruits.  Musculoskeletal: 2+ edema in bilateral legs no joint effusions. Neurologic:  Normal speech and language. No gross focal neurologic deficits are appreciated.  Skin:  Skin is warm, dry and intact. Psychiatric: Mood and affect are normal. Speech and behavior are normal. GU: Deferred   ____________________________________________   LABS (all labs ordered are listed, but only abnormal results are displayed)  Labs Reviewed  CBC WITH DIFFERENTIAL/PLATELET - Abnormal; Notable for the following components:      Result Value   RBC 3.46 (*)    Hemoglobin 10.7 (*)    HCT 33.9 (*)  RDW 16.2 (*)    All other components within normal limits  COMPREHENSIVE METABOLIC PANEL - Abnormal; Notable for the following components:   Glucose, Bld 127 (*)    BUN 36 (*)    Creatinine, Ser 1.31 (*)    Calcium 8.4 (*)    Total Protein 6.4 (*)    Albumin 3.1 (*)    GFR calc non Af Amer 49 (*)    GFR calc Af Amer 56 (*)    All other components within normal limits  BRAIN NATRIURETIC PEPTIDE - Abnormal; Notable for the following components:   B Natriuretic Peptide 859.8 (*)    All other components within normal limits  TROPONIN I (HIGH SENSITIVITY) - Abnormal; Notable for the following components:   Troponin I (High Sensitivity) 34 (*)    All other components within normal limits  SARS CORONAVIRUS 2 BY RT PCR (HOSPITAL ORDER, [place] [facility])  PROCALCITONIN  MAGNESIUM  URINALYSIS, COMPLETE (UACMP) WITH MICROSCOPIC   ____________________________________________   ED ECG REPORT I, Vanessa Allen, the attending physician, personally viewed and interpreted this ECG.  Normal sinus rate of 67, no ST elevations, no T wave inversions, left anterior fascicular block type I AV  block ____________________________________________  RADIOLOGY Robert Bellow, personally viewed and evaluated these images (plain radiographs) as part of my medical decision making, as well as reviewing the written report by the radiologist.  ED MD interpretation: Cardiomegaly with pulmonary edema  Official radiology report(s): DG Chest Portable 1 View  Result Date: 09/04/2019 CLINICAL DATA:  Shortness of breath EXAM: PORTABLE CHEST 1 VIEW COMPARISON:  None. FINDINGS: Diffuse hazy interstitial opacities with central vascular congestion, with septal and fissural thickening. Findings on a background of low volumes and atelectatic changes. Suspect a trace left effusion as well. Prominent cardiomediastinal silhouette though possibly accentuated by portable technique and low volumes. The aorta is calcified. The remaining cardiomediastinal contours are unremarkable. No acute osseous or soft tissue abnormality. Degenerative changes are present in the imaged spine and shoulders. Telemetry leads overlie the chest. IMPRESSION: Features most compatible with CHF/volume overload with edema, possible cardiomegaly and trace left effusion. Electronically Signed   By: Lovena Le M.D.   On: 09/04/2019 16:22    ____________________________________________   PROCEDURES  Procedure(s) performed (including Critical Care):  .Critical Care Performed by: Vanessa [place], MD Authorized by: Vanessa Garrison, MD   Critical care provider statement:    Critical care time (minutes):  45   Critical care was necessary to treat or prevent imminent or life-threatening deterioration of the following conditions:  Respiratory failure   Critical care was time spent personally by me on the following activities:  Discussions with consultants, evaluation of patient's response to treatment, examination of patient, ordering and performing treatments and interventions, ordering and review of laboratory studies, ordering and review of  radiographic studies, pulse oximetry, re-evaluation of patient's condition, obtaining history from patient or surrogate and review of old charts .1-3 Lead EKG Interpretation Performed by: Vanessa [place], MD Authorized by: Vanessa Luke, MD     Interpretation: normal     ECG rate:  60s    ECG rate assessment: normal     Rhythm: sinus rhythm     Ectopy: none     Conduction: abnormal     Abnormal conduction: 1st degree AV block       ____________________________________________   INITIAL IMPRESSION / ASSESSMENT AND PLAN / ED COURSE   Joshua Humphrey  was evaluated in Emergency Department on 08/20/2019 for the symptoms described in the history of present illness. He was evaluated in the context of the global COVID-19 pandemic, which necessitated consideration that the patient might be at risk for infection with the SARS-CoV-2 virus that causes COVID-19. Institutional protocols and algorithms that pertain to the evaluation of patients at risk for COVID-19 are in a state of rapid change based on information released by regulatory bodies including the CDC and federal and state organizations. These policies and algorithms were followed during the patient's care in the ED.     Pt presents with SOB  CHF-we will get x-ray to evaluate for edema given his significant leg swelling bilaterally. PNA-will get xray to evaluation Anemia-CBC to evaluate ACS- will get trops Arrhythmia-Will get EKG and keep on monitor.  COVID- will get testing per algorithm. PE-lower suspicion given no risk factors and other cause more likely however if x-ray is unrevealing will consider CT scan.  X-ray consistent with volume overload with edema  Procalcitonin is negative therefore making infection less likely  Cardiac marker slightly elevated at 34 more likely secondary to fluid overload  BNP is elevated at 859  Labs show a bump in his creatinine to 1.31 up from 1.75  Hemoglobin 10.7  Given the above this is  most likely secondary to CHF, volume overload. We will give a dose of IV Lasix and keep patient on oxygen for his hypoxic respiratory failure. Will discuss with hospital team for admission              ____________________________________________   FINAL CLINICAL IMPRESSION(S) / ED DIAGNOSES   Final diagnoses:  Acute congestive heart failure, unspecified heart failure type ([place])  AKI (acute kidney injury) ([place])  Acute respiratory failure with hypoxia ([place])     MEDICATIONS GIVEN DURING THIS VISIT:  Medications  furosemide (LASIX) injection 40 mg (has no administration in time range)     ED Discharge Orders    None       Note:  This document was prepared using Dragon voice recognition software and may include unintentional dictation errors.   Vanessa [place], MD 08/15/2019 1647    Vanessa , MD 08/23/19 [phone]

## 2019-08-23 NOTE — ED Notes (Signed)
PT wife went home with belongings at this time.

## 2019-08-23 NOTE — ED Triage Notes (Signed)
Patient arrived via EMS from [facility]. Patient was dinning out with family when patient got up and felt weak. Patient did not look very good so family called EMS. EMS arrived and patient O2 saturation was in the 80's.   Patient does not where any supplemental oxygen at home and has no history of respiratory issues.

## 2019-08-23 NOTE — Progress Notes (Signed)
Pt received to room 243 from ED.  Pt oriented to room and call bell.  See assessment and vs's.  Pt denies pain or discomfort.  Pt being admitted fro acute on chronic CHF. Sinus Brady 57.

## 2019-08-24 ENCOUNTER — Inpatient Hospital Stay (HOSPITAL_COMMUNITY)
Admit: 2019-08-24 | Discharge: 2019-08-24 | Disposition: A | Discharge status: Home / Self Care | Payer: Medicare HMO | Attending: Internal Medicine | Admitting: Internal Medicine

## 2019-08-24 DIAGNOSIS — J9601 Acute respiratory failure with hypoxia: Secondary | ICD-10-CM | POA: Diagnosis present

## 2019-08-24 DIAGNOSIS — I5031 Acute diastolic (congestive) heart failure: Secondary | ICD-10-CM

## 2019-08-24 DIAGNOSIS — I5033 Acute on chronic diastolic (congestive) heart failure: Secondary | ICD-10-CM

## 2019-08-24 DIAGNOSIS — G4733 Obstructive sleep apnea (adult) (pediatric): Secondary | ICD-10-CM

## 2019-08-24 LAB — CBC WITH DIFFERENTIAL/PLATELET
Abs Immature Granulocytes: 0.03 10*3/uL (ref 0.00–0.07)
Basophils Absolute: 0 10*3/uL (ref 0.0–0.1)
Basophils Relative: 1 %
Eosinophils Absolute: 0.2 10*3/uL (ref 0.0–0.5)
Eosinophils Relative: 2 %
HCT: 36 % — ABNORMAL LOW (ref 39.0–52.0)
Hemoglobin: 11.5 g/dL — ABNORMAL LOW (ref 13.0–17.0)
Immature Granulocytes: 0 %
Lymphocytes Relative: 15 %
Lymphs Abs: 1.1 10*3/uL (ref 0.7–4.0)
MCH: 30.9 pg (ref 26.0–34.0)
MCHC: 31.9 g/dL (ref 30.0–36.0)
MCV: 96.8 fL (ref 80.0–100.0)
Monocytes Absolute: 0.8 10*3/uL (ref 0.1–1.0)
Monocytes Relative: 11 %
Neutro Abs: 5 10*3/uL (ref 1.7–7.7)
Neutrophils Relative %: 71 %
Platelets: 235 10*3/uL (ref 150–400)
RBC: 3.72 MIL/uL — ABNORMAL LOW (ref 4.22–5.81)
RDW: 16.1 % — ABNORMAL HIGH (ref 11.5–15.5)
WBC: 7 10*3/uL (ref 4.0–10.5)
nRBC: 0 % (ref 0.0–0.2)

## 2019-08-24 LAB — GLUCOSE, CAPILLARY
Glucose-Capillary: 100 mg/dL — ABNORMAL HIGH (ref 70–99)
Glucose-Capillary: 205 mg/dL — ABNORMAL HIGH (ref 70–99)
Glucose-Capillary: 90 mg/dL (ref 70–99)
Glucose-Capillary: 119 mg/dL — ABNORMAL HIGH (ref 70–99)

## 2019-08-24 LAB — BASIC METABOLIC PANEL
Anion gap: 8 (ref 5–15)
BUN: 34 mg/dL — ABNORMAL HIGH (ref 8–23)
CO2: 30 mmol/L (ref 22–32)
Calcium: 8.5 mg/dL — ABNORMAL LOW (ref 8.9–10.3)
Chloride: 105 mmol/L (ref 98–111)
Creatinine, Ser: 1.23 mg/dL (ref 0.61–1.24)
GFR calc Af Amer: >60 mL/min (ref >60)
GFR calc non Af Amer: 52 mL/min — ABNORMAL LOW (ref >60)
Glucose, Bld: 122 mg/dL — ABNORMAL HIGH (ref 70–99)
Potassium: 3.7 mmol/L (ref 3.5–5.1)
Sodium: 143 mmol/L (ref 135–145)

## 2019-08-24 LAB — IRON AND TIBC
Iron: 38 ug/dL — ABNORMAL LOW (ref 45–182)
Saturation Ratios: 12 % — ABNORMAL LOW (ref 17.9–39.5)
TIBC: 323 ug/dL (ref 250–450)
UIBC: 285 ug/dL

## 2019-08-24 LAB — CBC
HCT: 34.5 % — ABNORMAL LOW (ref 39.0–52.0)
Hemoglobin: 10.9 g/dL — ABNORMAL LOW (ref 13.0–17.0)
MCH: 30.3 pg (ref 26.0–34.0)
MCHC: 31.6 g/dL (ref 30.0–36.0)
MCV: 95.8 fL (ref 80.0–100.0)
Platelets: 208 10*3/uL (ref 150–400)
RBC: 3.6 MIL/uL — ABNORMAL LOW (ref 4.22–5.81)
RDW: 16.2 % — ABNORMAL HIGH (ref 11.5–15.5)
WBC: 6.1 10*3/uL (ref 4.0–10.5)
nRBC: 0 % (ref 0.0–0.2)

## 2019-08-24 LAB — MAGNESIUM: Magnesium: 2 mg/dL (ref 1.7–2.4)

## 2019-08-24 LAB — VITAMIN B12: Vitamin B-12: 269 pg/mL (ref 180–914)

## 2019-08-24 LAB — ECHOCARDIOGRAM COMPLETE
Height: 69 in
Weight: 3372.16 oz

## 2019-08-24 LAB — PROCALCITONIN: Procalcitonin: <0.1 ng/mL

## 2019-08-24 MED ORDER — PERFLUTREN LIPID MICROSPHERE
1.0000 mL | INTRAVENOUS | Status: AC | PRN
  Administered 2019-08-24: 2 mL via INTRAVENOUS
  Filled 2019-08-24: qty 10

## 2019-08-24 NOTE — TOC Initial Note (Signed)
Transition of Care [facility]) - Initial/Assessment Note    Patient Details  Name: Joshua Humphrey MRN: [id number] Date of Birth: 11/29/1929  Transition of [facility]) CM/SW Contact:    Eileen Stanford, LCSW Phone Number: 08/17/2019, 3:54 PM  Clinical Narrative:  Pt is only alert to self and place. CSW spoke with pt's daughter. Pt is from [facility] ALF. Pt's daughter agreeable to pt going to SNF at [facility]. CSW sent referral.                  Expected Discharge Plan: Skilled Nursing Facility Barriers to Discharge: Continued Medical Work up   Patient Goals and CMS Choice Patient states their goals for this hospitalization and ongoing recovery are:: pt to return to [facility]   Choice offered to / list presented to : Adult Children  Expected Discharge Plan and Services Expected Discharge Plan: [place] In-house Referral: Clinical Social Work   Post Acute Care Choice: [place] Living arrangements for the past 2 months: [place]                                      Prior Living Arrangements/Services Living arrangements for the past 2 months: [place] Lives with:: Self Patient language and need for interpreter reviewed:: Yes Do you feel safe going back to the place where you live?: Yes      Need for Family Participation in Patient Care: Yes (Comment) Care giver support system in place?: Yes (comment)   Criminal Activity/Legal Involvement Pertinent to Current Situation/Hospitalization: No - Comment as needed  Activities of Daily Living Home Assistive Devices/Equipment: Walker (specify type) ADL Screening (condition at time of admission) Patient's cognitive ability adequate to safely complete daily activities?: Yes Is the patient deaf or have difficulty hearing?: No Does the patient have difficulty seeing, even when wearing glasses/contacts?: Yes Does the patient have difficulty concentrating, remembering, or  making decisions?: Yes Patient able to express need for assistance with ADLs?: Yes Does the patient have difficulty dressing or bathing?: No Independently performs ADLs?: Yes (appropriate for developmental age) Does the patient have difficulty walking or climbing stairs?: No Weakness of Legs: Both Weakness of Arms/Hands: None  Permission Sought/Granted Permission sought to share information with : Family Supports    Share Information with NAME: carla  Permission granted to share info w AGENCY: [facility]  Permission granted to share info w Relationship: daughter     Emotional Assessment Appearance:: Appears stated age Attitude/Demeanor/Rapport: Unable to Assess Affect (typically observed): Unable to Assess Orientation: : Oriented to Self, Oriented to Place Alcohol / Substance Use: Not Applicable Psych Involvement: No (comment)  Admission diagnosis:  Acute respiratory failure with hypoxia ([place]) [J96.01] AKI (acute kidney injury) ([place]) [N17.9] Acute on chronic diastolic CHF (congestive heart failure) ([facility]) [I50.33] Acute congestive heart failure, unspecified heart failure type [facility]) [I50.9] Patient Active Problem List   Diagnosis Date Noted  . Acute hypoxemic respiratory failure ([place]) 08/23/2019  . Acute on chronic diastolic CHF (congestive heart failure) ([place]) 08/30/2019  . Pharmacologic therapy 08/02/2019  . Personal history of prostate cancer 05/19/2019  . Chronic knee pain (Right) 02/28/2019  . Osteoarthritis of knee (Right) 02/28/2019  . Aortic stenosis 10/27/2017  . Chronic diastolic CHF (congestive heart failure) ([place]) 10/27/2017  . Uses walker 10/27/2017  . Edema extremities 10/12/2017  . DDD (degenerative disc disease), lumbar 09/02/2017  .  Weakness of lower extremities (Bilateral) 09/02/2017  . Spondylosis without myelopathy or radiculopathy, lumbosacral region 07/15/2017  . Lumbar spondylosis 06/30/2017  . Ataxia 04/28/2017  . Lumbar facet joint osteoarthritis  (Bilateral) 04/08/2017  . Osteoarthritis of knee (Bilateral) 12/22/2016  . Abnormal knee x-ray (2017) 12/22/2016  . Lumbar facet syndrome (Bilateral) (R>L) 11/04/2016  . Osteoarthritis of lumbar spine 11/04/2016  . Cervical spinal stenosis 11/03/2016  . Cervical foraminal stenosis 11/03/2016  . Osteoarthritis of hip (Left) 10/15/2016  . Chronic hip arthralgia (Left) 10/15/2016  . Vitamin D insufficiency 10/15/2016  . Osteoarthritis 10/08/2016  . Lumbar foraminal stenosis (Right: L4-5 and L5-S1) (Left: L2-3, L3-4, and L4-5) 10/08/2016  . Neurogenic pain 10/05/2016  . Disturbance of skin sensation 09/08/2016  . Chronic hip pain (Secondary Area of Pain) (Left) 09/08/2016  . Chronic knee pain (Bilateral) (L>R) 09/08/2016  . Generalized weakness 09/08/2016  . Muscle weakness (generalized) 09/08/2016  . Chronic Lumbar radicular pain (Left) (L5) 09/08/2016  . Cortical senile cataract 09/06/2016  . Chronic pain syndrome 09/06/2016  . Long term (current) use of opiate analgesic 09/06/2016  . Long term prescription opiate use 09/06/2016  . Opiate use (30 MME/Day) 09/06/2016  . Chronic low back pain (Primary Area of Pain) (Bilateral) (R>L) 09/06/2016  . Chronic lower extremity pain [facility] source of pain) (Bilateral) (L>R) 09/06/2016  . Abnormal MRI, lumbar spine 09/06/2016  . Abnormal MRI, cervical spine 09/06/2016  . Lumbar spinal stenosis with neurogenic claudication 09/06/2016  . Health care maintenance 03/31/2016  . Mass of subcutaneous tissue of back 03/06/2016  . Scoliosis (and kyphoscoliosis), idiopathic 10/22/2015  . Lumbar central spinal stenosis (Severe:L4-5; Mild:L3-4; Moderate:L2-3) 10/22/2015  . Lipoma of neck 01/09/2014  . Post-operative state 12/31/2011  . Restless leg syndrome 12/30/2011  . Allergic rhinitis 11/25/2011  . Anemia 11/25/2011  . Asthma 11/25/2011  . GERD (gastroesophageal reflux disease) 11/25/2011  . Hyperlipidemia 11/25/2011  . Hypertension 11/25/2011   . Obesity 11/25/2011  . Obstructive sleep apnea 11/25/2011  . Prostate cancer ([place]) 11/25/2011  . Cataract 10/29/2011  . Type 2 diabetes mellitus with stage 1 chronic kidney disease ([place]) 10/29/2011   PCP:  Kirk Ruths, MD Pharmacy:   [id number] [address], [place] [address] Phone: [phone] Fax: [facility] [place], [place] [address] Phone: [phone] Fax: [phone]     Social Determinants of Health (SDOH) Interventions    Readmission Risk Interventions No flowsheet data found.

## 2019-08-24 NOTE — Evaluation (Addendum)
Physical Therapy Evaluation Patient Details Name: Joshua Humphrey MRN: [id number] DOB: 1929/06/14 Today's Date: 08/23/2019   History of Present Illness  Pt admitted for acute on chronic heart failure. History includes DM, prostate cancer, HTN, and sleep apnea.  Clinical Impression  Pt is a pleasant 86 year old male who was admitted for acute on chronic heart failure. Pt performs bed mobility with cga, transfers with min assist and RW, and ambulation with RW and min assist. Pt on 5L of O2 upon arrival with O2 sats WNL. Decreased to 4L and then 3L with mobility. Desats quickly on 3L to 82%. Required to be bumped up to 4L of O2 to maintain sats WNL. Per patient, not on home O2 at baseline. Pt is poor historian, possibly due to being sleepy. Varying levels of history given, unsure of accuracy. Pt demonstrates deficits with strength (B LE > B UE), balance, mobility, endurance. Doesn't appear to be at baseline level at this time and very high risk. Would benefit from skilled PT to address above deficits and promote optimal return to PLOF; recommend transition to STR upon discharge from acute hospitalization.     Follow Up Recommendations SNF    Equipment Recommendations  None recommended by PT    Recommendations for Other Services       Precautions / Restrictions Precautions Precautions: Fall Restrictions Weight Bearing Restrictions: No      Mobility  Bed Mobility Overal bed mobility: Needs Assistance Bed Mobility: Supine to Sit     Supine to sit: Min guard     General bed mobility comments: Needs cues for initiation. Follows commands well. Once seated at EOB, able to sit with upright posture. O2 sats at 88% on 3L of O2.  Transfers Overall transfer level: Needs assistance Equipment used: Rolling walker (2 wheeled) Transfers: Sit to/from Stand Sit to Stand: Min assist         General transfer comment: able to stand from low bed. Slow transfer. Once standing, upright posture  performed with cues. Slight SOB symptoms noted  Ambulation/Gait Ambulation/Gait assistance: Min assist   Assistive device: Rolling walker (2 wheeled) Gait Pattern/deviations: Step-to pattern     General Gait Details: unsteady during initial standing, pre gait activities performed including marching in place. Able to take limited steps over to recliner. Fatigues quickly with B knee buckling slightly. Unsafe to continue further ambulation. O2 sats decreased to 82% on 3L of O2 with exertion. Increased to 4L of O2 with sats improved to 89%  Stairs            Wheelchair Mobility    Modified Rankin (Stroke Patients Only)       Balance Overall balance assessment: Needs assistance Sitting-balance support: Feet supported Sitting balance-Leahy Scale: Fair     Standing balance support: Bilateral upper extremity supported Standing balance-Leahy Scale: Fair Standing balance comment: trunk flexed, slight post lean                             Pertinent Vitals/Pain Pain Assessment: No/denies pain    Home Living Family/patient expects to be discharged to:: Assisted living               Home Equipment: None Additional Comments: reports he uses RW at baseline    Prior Function Level of Independence: Independent with assistive device(s)         Comments:  Reports hes uses RW at all times and no falls. No home  O2 and lives alone at ALF. Limited ambulation in ALF, room to room     Hand Dominance        Extremity/Trunk Assessment   Upper Extremity Assessment Upper Extremity Assessment: Generalized weakness (B UE grossly 3+/5)    Lower Extremity Assessment Lower Extremity Assessment: Generalized weakness (B LE grossly 3+/5)       Communication   Communication: No difficulties  Cognition Arousal/Alertness:  (sleepy) Behavior During Therapy: WFL for tasks assessed/performed Overall Cognitive Status: Impaired/Different from baseline                                  General Comments: Pt altered this morning reporting he is indep with mobility and then later says hes not. Varying with alertness      General Comments      Exercises Other Exercises Other Exercises: supine ther-ex performed on B LE including AP, heel slides, and quad sets. All ther-ex performed x 10 reps. All mobility performed on 4L of O2 with sats at 88%.   Assessment/Plan    PT Assessment Patient needs continued PT services  PT Problem List Decreased strength;Decreased activity tolerance;Decreased balance;Decreased mobility       PT Treatment Interventions DME instruction;Gait training;Therapeutic exercise;Balance training    PT Goals (Current goals can be found in the Care Plan section)  Acute Rehab PT Goals Patient Stated Goal: to eat breakfast in the chair PT Goal Formulation: With patient Time For Goal Achievement: 09/07/19 Potential to Achieve Goals: Good    Frequency Min 2X/week   Barriers to discharge        Co-evaluation               AM-PAC PT "6 Clicks" Mobility  Outcome Measure Help needed turning from your back to your side while in a flat bed without using bedrails?: A Little Help needed moving from lying on your back to sitting on the side of a flat bed without using bedrails?: A Little Help needed moving to and from a bed to a chair (including a wheelchair)?: A Little Help needed standing up from a chair using your arms (e.g., wheelchair or bedside chair)?: A Little Help needed to walk in hospital room?: A Lot Help needed climbing 3-5 steps with a railing? : A Lot 6 Click Score: 16    End of Session Equipment Utilized During Treatment: Gait belt;Oxygen Activity Tolerance: Patient limited by fatigue Patient left: in chair;with chair alarm set Nurse Communication: Mobility status PT Visit Diagnosis: Unsteadiness on feet (R26.81);Muscle weakness (generalized) (M62.81);Difficulty in walking, not elsewhere classified  (R26.2)    Time: 5830-9407 PT Time Calculation (min) (ACUTE ONLY): 29 min   Charges:   PT Evaluation $PT Eval Moderate Complexity: 1 Mod PT Treatments $Therapeutic Exercise: 8-22 mins        Greggory Stallion, PT, DPT [phone]   Joshua Humphrey 08/14/2019, 10:16 AM

## 2019-08-24 NOTE — Evaluation (Addendum)
Occupational Therapy Evaluation Patient Details Name: Joshua Humphrey MRN: [id number] DOB: 13-Oct-1929 Today's Date: 08/19/2019    History of Present Illness Pt admitted for acute on chronic heart failure. History includes DM, prostate cancer, HTN, and sleep apnea.   Clinical Impression   Mr Horn presents awake but sleepy in the recliner with his daughter present, 91% O2 sats on 4L [place]. He participates willingly in evaluation questions, though occasionally appears confused. His daughter fills in additional details about living situation and PLOF and reports that she has noted general confusion that is not pt's baseline. He lives at [facility] in [place] apartment, was independent in ADL prior to hospitalization, still drives and uses a RW for all ambulation. He has had 3 falls in the past 12 months and is not on O2 at baseline. During session, mild LUE tremor and general unsteadiness noted in sitting balance to reach socks. Pt completed stand-pivot transfer using RW with Min A from recliner to EOB. His O2 sats decreased to 84%, not improving with pursed lip breathing. Pt denied feeling dizzy, however RN notified and O2 increased to 5L with improved O2 to >90%. Pt required Max A x2 for repositioning in bed. Pt and caregiver educated on role of OT and plan of care during stay. Pt returned to 4L of O2 at end of session, per RN; O2 sats stable at 92% and daughter present. Pt will benefit from skilled acute OT services to address decreased activity tolerance, balance and strength all impacting pt baseline functional ADL performance. Recommend SNF upon discharge to maximize pt outcomes, safety and return to PLOF.     Follow Up Recommendations  SNF    Equipment Recommendations  Other (comment) (TBD at next venue of care)    Recommendations for Other Services       Precautions / Restrictions Precautions Precautions: Fall Restrictions Other Position/Activity Restrictions: monitor O2 throughout       Mobility Bed Mobility Overal bed mobility: Needs Assistance Bed Mobility: Sit to Supine       Sit to supine: Min assist   General bed mobility comments: Needed assist for BLE mgt and foley mgt. Max A x2 to reposition higher up in bed.  Transfers Overall transfer level: Needs assistance Equipment used: Rolling walker (2 wheeled) Transfers: Sit to/from Stand Sit to Stand: Min assist         General transfer comment: Able to stand from recliner using RW with Min A for balance and stability. Some unsteadiness noted. Verbal cues for standing up straight.    Balance Overall balance assessment: Needs assistance Sitting-balance support: Feet supported;No upper extremity supported Sitting balance-Leahy Scale: Fair Sitting balance - Comments: minor unsteadiness with dynamic sitting balance reaching outside BOS   Standing balance support: Bilateral upper extremity supported Standing balance-Leahy Scale: Fair Standing balance comment: required heavy RW use and Min A for maintaining stability                           ADL either performed or assessed with clinical judgement   ADL Overall ADL's : Needs assistance/impaired                                       General ADL Comments: Pt able to reach b/l feet to adjust socks during session. Unable to participate in further ADL following recliner to bed t/f due to  SOB and fatigue. At this time, anticipate Mod A for seated ADL and Mod-Max A for LB dressing/bathing using RW for support.     Vision Baseline Vision/History: Wears glasses Wears Glasses: At all times       Perception     Praxis      Pertinent Vitals/Pain Pain Assessment: No/denies pain     Hand Dominance Right   Extremity/Trunk Assessment Upper Extremity Assessment Upper Extremity Assessment: Generalized weakness (BUE grossly 3+/5; mild LUE tremors noted)   Lower Extremity Assessment Lower Extremity Assessment: Generalized  weakness   Cervical / Trunk Assessment Cervical / Trunk Assessment:  (soft mass on posterior aspect of neck)   Communication Communication Communication: No difficulties   Cognition Arousal/Alertness:  (sleepy) Behavior During Therapy: WFL for tasks assessed/performed Overall Cognitive Status: Impaired/Different from baseline                                 General Comments: Dtr reports that she notices pt with more general confusion. Pt is oriented to self, month and year (not date), birth date and situation. Thought he was at [facility] in [place]. Some general confusion and sleepiness noted.   General Comments       Exercises Other Exercises Other Exercises: Facilitated bed mobility and repositioning with pt and caregiver, educated on role of OT and plan of care, pursed lip breathing   Shoulder Instructions      Home Living Family/patient expects to be discharged to:: Assisted living                             Home Equipment: None   Additional Comments: uses RW 24/7 at baseline      Prior Functioning/Environment Level of Independence: Independent with assistive device(s)        Comments: Reports hes uses RW at all times and 3 falls in past 12 months. No home O2 and lives alone at ALF. Limited ambulation in ALF, room to room. Still drives, but dtr does grocery shopping for him.        OT Problem List: Decreased strength;Decreased range of motion;Decreased activity tolerance;Impaired balance (sitting and/or standing);Decreased safety awareness;Decreased knowledge of use of DME or AE;Cardiopulmonary status limiting activity      OT Treatment/Interventions: Self-care/ADL training;Therapeutic exercise;Therapeutic activities;Energy conservation;Visual/perceptual remediation/compensation;DME and/or AE instruction;Patient/family education;Balance training    OT Goals(Current goals can be found in the care plan section) Acute Rehab OT  Goals Patient Stated Goal: To get better soon OT Goal Formulation: With patient Time For Goal Achievement: 09/07/19 Potential to Achieve Goals: Good  OT Frequency: Min 2X/week   Barriers to D/C:            Co-evaluation              AM-PAC OT "6 Clicks" Daily Activity     Outcome Measure Help from another person eating meals?: None Help from another person taking care of personal grooming?: A Little Help from another person toileting, which includes using toliet, bedpan, or urinal?: A Lot Help from another person bathing (including washing, rinsing, drying)?: A Lot Help from another person to put on and taking off regular upper body clothing?: A Little Help from another person to put on and taking off regular lower body clothing?: A Lot 6 Click Score: 16   End of Session Equipment Utilized During Treatment: Gait belt;Rolling walker Nurse Communication: Mobility  status  Activity Tolerance: Patient limited by fatigue (Pt limited by O2 sats) Patient left: in bed;with call bell/phone within reach;with bed alarm set;with family/visitor present  OT Visit Diagnosis: Other abnormalities of gait and mobility (R26.89);Unsteadiness on feet (R26.81);Repeated falls (R29.6);History of falling (Z91.81);Muscle weakness (generalized) (M62.81)                Time: 1007-1219 OT Time Calculation (min): 45 min Charges:  OT General Charges $OT Visit: 1 Visit OT Evaluation $OT Eval Moderate Complexity: 1 Mod OT Treatments $Therapeutic Activity: 23-37 mins  Jerilynn Birkenhead, OTS 08/27/2019, 5:11 PM

## 2019-08-24 NOTE — Progress Notes (Signed)
PROGRESS NOTE    Joshua Humphrey  RVI:[id number] DOB: 21-Jul-1929 DOA: 08/09/2019 PCP: Kirk Ruths, MD   Chief complaint.  Shortness of breath. Brief Narrative:   Patient is a 86 year old male with history of type 2 diabetes, prostate cancer, essential hypertension, obstructive sleep apnea who present to the hospital with a short of breath and cough he had a profound elevation of BNP, negative procalcitonin level.  Chest x-ray showed pulmonary edema.  He was started on IV Lasix for exacerbation of congestive heart failure.   Assessment & Plan:   Active Problems:   Acute on chronic diastolic CHF (congestive heart failure) ([place])  #1.  Acute hypoxemic restaurant failure secondary to exacerbation of congestive heart failure. Patient still requiring high flow oxygen.  Continue diuretics and oxygen treatment.  #2.  Acute on chronic diastolic congestive heart failure. Pending echocardiogram.  Patient condition is relatively stable, continue IV Lasix 40 mg twice a day.  Continue to monitor electrolytes and renal function.  3.  Obstructive sleep apnea. Patient could not tolerate CPAP last night, will ask respiratory therapist to adjust.  4.  Uncontrolled type 2 diabetes with hyperglycemia. Continue sliding scale insulin for now.  Glucose not significantly elevated.  5.  Acute kidney injury. Renal function improved after diuretics.  6.  Anemia.  Pending iron B12 study.      DVT prophylaxis: Lovenox Code Status: DNR Family Communication:None Disposition Plan:  . Patient came from: ALF            . Anticipated d/c place:ALF . Barriers to d/c OR conditions which need to be met to effect a safe d/c:   Consultants:   None  Procedures: None Antimicrobials: None  Subjective: Patient was placed on a nasal CPAP last night, he could not tolerated.  He had some bronchospasm.  Was given albuterol treatment.  He slept later last night on 5 L oxygen. He has been diuresed.   Short of breath is still present today.  No cough. No fever or chills.  No diarrhea constipation.  Objective: Vitals:   09/06/2019 0445 09/01/2019 0453 08/08/2019 0534 09/05/2019 0758  BP: (!) 141/76 125/83  130/77  Pulse: 66 68  74  Resp: _0 Temp: 98.6 F (37 C) 98.6 F (37 C)  99.5 F (37.5 C)  TempSrc: Oral Oral  Oral  SpO2: 92% 91% 91% (!) 89%  Weight:  95.6 kg    Height:        Intake/Output Summary (Last 24 hours) at 09/02/2019 0942 Last data filed at 08/22/2019 2034 Gross per 24 hour  Intake --  Output 260 ml  Net -260 ml   Filed Weights   08/21/2019 1609 08/21/2019 1900 08/14/2019 0453  Weight: 96.2 kg 95.6 kg 95.6 kg    Examination:  General exam: Appears calm and comfortable  Respiratory system: A few crackles in the base. Respiratory effort normal. Cardiovascular system: S1 & S2 heard, RRR. No JVD, murmurs, rubs, gallops or clicks. 2+ pedal edema. Gastrointestinal system: Abdomen is nondistended, soft and nontender. No organomegaly or masses felt. Normal bowel sounds heard. Central nervous system: Drowsy and oriented to place and person.  No focal neurological deficits. Extremities: Symmetric  Skin: No rashes, lesions or ulcers Psychiatry:  Mood & affect appropriate.     Data Reviewed: I have personally reviewed following labs and imaging studies  CBC: Recent Labs  Lab 08/22/2019 1604 08/08/2019 1727 08/08/2019 0522  WBC 6.8 7.4 6.1  NEUTROABS 4.4  --   --  HGB 10.7* 10.9* 10.9*  HCT 33.9* 34.7* 34.5*  MCV 98.0 97.5 95.8  PLT 213 223 409   Basic Metabolic Panel: Recent Labs  Lab 08/10/2019 1604 08/11/2019 1727 08/26/2019 0522  NA 141  --  143  K 4.0  --  3.7  CL 106  --  105  CO2 27  --  30  GLUCOSE 127*  --  122*  BUN 36*  --  34*  CREATININE 1.31* 1.30* 1.23  CALCIUM 8.4*  --  8.5*  MG 2.0  --  2.0   GFR: Estimated Creatinine Clearance: 48.3 mL/min (by C-G formula based on SCr of 1.23 mg/dL). Liver Function Tests: Recent Labs  Lab  08/12/2019 1604  AST 40  ALT 35  ALKPHOS 48  BILITOT 0.9  PROT 6.4*  ALBUMIN 3.1*   No results for input(s): LIPASE, AMYLASE in the last 168 hours. No results for input(s): AMMONIA in the last 168 hours. Coagulation Profile: No results for input(s): INR, PROTIME in the last 168 hours. Cardiac Enzymes: No results for input(s): CKTOTAL, CKMB, CKMBINDEX, TROPONINI in the last 168 hours. BNP (last 3 results) No results for input(s): PROBNP in the last 8760 hours. HbA1C: No results for input(s): HGBA1C in the last 72 hours. CBG: Recent Labs  Lab 08/08/2019 2116 08/26/2019 0801  GLUCAP 103* 100*   Lipid Profile: No results for input(s): CHOL, HDL, LDLCALC, TRIG, CHOLHDL, LDLDIRECT in the last 72 hours. Thyroid Function Tests: No results for input(s): TSH, T4TOTAL, FREET4, T3FREE, THYROIDAB in the last 72 hours. Anemia Panel: No results for input(s): VITAMINB12, FOLATE, FERRITIN, TIBC, IRON, RETICCTPCT in the last 72 hours. Sepsis Labs: Recent Labs  Lab 09/02/2019 1604 08/11/2019 0522  PROCALCITON <0.10 <0.10    Recent Results (from the past 240 hour(s))  SARS Coronavirus 2 by RT PCR (hospital order, performed in [facility] hospital lab) Nasopharyngeal Nasopharyngeal Swab     Status: None   Collection Time: 08/08/2019  4:04 PM   Specimen: Nasopharyngeal Swab  Result Value Ref Range Status   SARS Coronavirus 2 NEGATIVE NEGATIVE Final    Comment: (NOTE) SARS-CoV-2 target nucleic acids are NOT DETECTED.  The SARS-CoV-2 RNA is generally detectable in upper and lower respiratory specimens during the acute phase of infection. The lowest concentration of SARS-CoV-2 viral copies this assay can detect is 250 copies / mL. A negative result does not preclude SARS-CoV-2 infection and should not be used as the sole basis for treatment or other patient management decisions.  A negative result may occur with improper specimen collection / handling, submission of specimen other than  nasopharyngeal swab, presence of viral mutation(s) within the areas targeted by this assay, and inadequate number of viral copies (<250 copies / mL). A negative result must be combined with clinical observations, patient history, and epidemiological information.  Fact Sheet for Patients:   StrictlyIdeas.no  Fact Sheet for Healthcare Providers: BankingDealers.co.za  This test is not yet approved or  cleared by the [place] FDA and has been authorized for detection and/or diagnosis of SARS-CoV-2 by FDA under an Emergency Use Authorization (EUA).  This EUA will remain in effect (meaning this test can be used) for the duration of the COVID-19 declaration under Section 564(b)(1) of the Act, 21 U.S.C. section 360bbb-3(b)(1), unless the authorization is terminated or revoked sooner.  Performed at [facility], [address], [place], [place] [address]          Radiology Studies: DG Chest Portable 1 View  Result  Date: 08/28/2019 CLINICAL DATA:  Shortness of breath EXAM: PORTABLE CHEST 1 VIEW COMPARISON:  None. FINDINGS: Diffuse hazy interstitial opacities with central vascular congestion, with septal and fissural thickening. Findings on a background of low volumes and atelectatic changes. Suspect a trace left effusion as well. Prominent cardiomediastinal silhouette though possibly accentuated by portable technique and low volumes. The aorta is calcified. The remaining cardiomediastinal contours are unremarkable. No acute osseous or soft tissue abnormality. Degenerative changes are present in the imaged spine and shoulders. Telemetry leads overlie the chest. IMPRESSION: Features most compatible with CHF/volume overload with edema, possible cardiomegaly and trace left effusion. Electronically Signed   By: Lovena Le M.D.   On: 08/26/2019 16:22        Scheduled Meds: . aspirin EC  81 mg Oral Daily  . atorvastatin  40 mg Oral  Daily  . carvedilol  12.5 mg Oral BID WC  . enoxaparin (LOVENOX) injection  40 mg Subcutaneous Q24H  . Ferrous Fumarate  1 tablet Oral BID  . furosemide  40 mg Intravenous Once  . furosemide  40 mg Intravenous Q12H  . gabapentin  900 mg Oral TID  . hydrALAZINE  50 mg Oral TID  . insulin aspart  0-5 Units Subcutaneous QHS  . insulin aspart  0-9 Units Subcutaneous TID WC  . losartan  100 mg Oral Daily  . pantoprazole  40 mg Oral Daily   Continuous Infusions:   LOS: 1 day    Time spent: 25 minutes    Sharen Hones, MD [facility]   To contact the attending provider between 7A-7P or the covering provider during after hours 7P-7A, please log into the web site [email or web address] and access using universal [place] password for that web site. If you do not have the password, please call the hospital operator.  08/26/2019, 9:42 AM

## 2019-08-24 NOTE — Care Management Important Message (Signed)
Important Message  Patient Details  Name: Joshua Humphrey MRN: [id number] Date of Birth: 1929-09-07   Medicare Important Message Given:  Yes  Initial Medicare IM given by Patient Access Associate on 08/16/2019 at 11:06am.   Dannette Barbara 08/27/2019, 7:31 PM

## 2019-08-24 NOTE — Progress Notes (Signed)
*  PRELIMINARY RESULTS* Echocardiogram 2D Echocardiogram has been performed.  Joshua Humphrey 08/16/2019, 11:25 AM

## 2019-08-24 NOTE — NC FL2 (Signed)
[place] LEVEL OF CARE SCREENING TOOL     IDENTIFICATION  Patient Name: Joshua Humphrey Birthdate: 1929/08/28 Sex: male Admission Date (Current Location): 08/11/2019  [facility] and [place] Number:  Engineering geologist and Address:  [facility], [address] [place], [place] [address]      Provider Number: [id number]  Attending Physician Name and Address:  Sharen Hones, MD  Relative Name and Phone Number:       Current Level of Care: Hospital Recommended Level of Care: [place] Prior Approval Number:    Date Approved/Denied:   PASRR Number:    Discharge Plan: SNF    Current Diagnoses: Patient Active Problem List   Diagnosis Date Noted  . Acute hypoxemic respiratory failure ([place]) 08/24/2019  . Acute on chronic diastolic CHF (congestive heart failure) ([place]) 08/23/2019  . Pharmacologic therapy 08/02/2019  . Personal history of prostate cancer 05/19/2019  . Chronic knee pain (Right) 02/28/2019  . Osteoarthritis of knee (Right) 02/28/2019  . Aortic stenosis 10/27/2017  . Chronic diastolic CHF (congestive heart failure) ([place]) 10/27/2017  . Uses walker 10/27/2017  . Edema extremities 10/12/2017  . DDD (degenerative disc disease), lumbar 09/02/2017  . Weakness of lower extremities (Bilateral) 09/02/2017  . Spondylosis without myelopathy or radiculopathy, lumbosacral region 07/15/2017  . Lumbar spondylosis 06/30/2017  . Ataxia 04/28/2017  . Lumbar facet joint osteoarthritis (Bilateral) 04/08/2017  . Osteoarthritis of knee (Bilateral) 12/22/2016  . Abnormal knee x-ray (2017) 12/22/2016  . Lumbar facet syndrome (Bilateral) (R>L) 11/04/2016  . Osteoarthritis of lumbar spine 11/04/2016  . Cervical spinal stenosis 11/03/2016  . Cervical foraminal stenosis 11/03/2016  . Osteoarthritis of hip (Left) 10/15/2016  . Chronic hip arthralgia (Left) 10/15/2016  . Vitamin D insufficiency 10/15/2016  . Osteoarthritis 10/08/2016   . Lumbar foraminal stenosis (Right: L4-5 and L5-S1) (Left: L2-3, L3-4, and L4-5) 10/08/2016  . Neurogenic pain 10/05/2016  . Disturbance of skin sensation 09/08/2016  . Chronic hip pain (Secondary Area of Pain) (Left) 09/08/2016  . Chronic knee pain (Bilateral) (L>R) 09/08/2016  . Generalized weakness 09/08/2016  . Muscle weakness (generalized) 09/08/2016  . Chronic Lumbar radicular pain (Left) (L5) 09/08/2016  . Cortical senile cataract 09/06/2016  . Chronic pain syndrome 09/06/2016  . Long term (current) use of opiate analgesic 09/06/2016  . Long term prescription opiate use 09/06/2016  . Opiate use (30 MME/Day) 09/06/2016  . Chronic low back pain (Primary Area of Pain) (Bilateral) (R>L) 09/06/2016  . Chronic lower extremity pain [facility] source of pain) (Bilateral) (L>R) 09/06/2016  . Abnormal MRI, lumbar spine 09/06/2016  . Abnormal MRI, cervical spine 09/06/2016  . Lumbar spinal stenosis with neurogenic claudication 09/06/2016  . Health care maintenance 03/31/2016  . Mass of subcutaneous tissue of back 03/06/2016  . Scoliosis (and kyphoscoliosis), idiopathic 10/22/2015  . Lumbar central spinal stenosis (Severe:L4-5; Mild:L3-4; Moderate:L2-3) 10/22/2015  . Lipoma of neck 01/09/2014  . Post-operative state 12/31/2011  . Restless leg syndrome 12/30/2011  . Allergic rhinitis 11/25/2011  . Anemia 11/25/2011  . Asthma 11/25/2011  . GERD (gastroesophageal reflux disease) 11/25/2011  . Hyperlipidemia 11/25/2011  . Hypertension 11/25/2011  . Obesity 11/25/2011  . Obstructive sleep apnea 11/25/2011  . Prostate cancer ([place]) 11/25/2011  . Cataract 10/29/2011  . Type 2 diabetes mellitus with stage 1 chronic kidney disease ([place]) 10/29/2011    Orientation RESPIRATION BLADDER Height & Weight     Self, Place  O2 ([place] 4L) External catheter, Incontinent (6/16) Weight: 210 lb 12.2 oz (95.6  kg) Height:  5\' 9"  (175.3 cm)  BEHAVIORAL SYMPTOMS/MOOD NEUROLOGICAL BOWEL NUTRITION STATUS       Incontinent Diet (heart healthy, thin liquids)  AMBULATORY STATUS COMMUNICATION OF NEEDS Skin   Limited Assist Verbally Normal                       Personal Care Assistance Level of Assistance  Bathing, Dressing, Feeding Bathing Assistance: Limited assistance Feeding assistance: Independent Dressing Assistance: Limited assistance     Functional Limitations Info  Sight, Speech, Hearing Sight Info: Adequate Hearing Info: Adequate Speech Info: Adequate    SPECIAL CARE FACTORS FREQUENCY  PT (By licensed PT), OT (By licensed OT)     PT Frequency: 5x OT Frequency: 5x            Contractures Contractures Info: Not present    Additional Factors Info  Code Status, Allergies Code Status Info: DNR Allergies Info: NO known allergies           Current Medications (08/23/2019):  This is the current hospital active medication list Current Facility-Administered Medications  Medication Dose Route Frequency Provider Last Rate Last Admin  . acetaminophen (TYLENOL) tablet 325-650 mg  325-650 mg Oral Q6H PRN Sharen Hones, MD      . albuterol (PROVENTIL) (2.5 MG/3ML) 0.083% nebulizer solution 3 mL  3 mL Inhalation Q4H PRN Sharen Hones, MD   3 mL at 08/13/2019 0532  . aspirin EC tablet 81 mg  81 mg Oral Daily Sharen Hones, MD   81 mg at 08/22/2019 0951  . atorvastatin (LIPITOR) tablet 40 mg  40 mg Oral Daily Sharen Hones, MD   40 mg at 08/28/2019 0951  . carvedilol (COREG) tablet 12.5 mg  12.5 mg Oral BID WC Sharen Hones, MD   12.5 mg at 08/16/2019 0951  . enoxaparin (LOVENOX) injection 40 mg  40 mg Subcutaneous Q24H Sharen Hones, MD   40 mg at 08/18/2019 1850  . ferrous fumarate (HEMOCYTE - 106 mg FE) tablet 106 mg of iron  1 tablet Oral BID Sharen Hones, MD   106 mg of iron at 08/14/2019 0951  . furosemide (LASIX) injection 40 mg  40 mg Intravenous Once Vanessa Downing, MD      . furosemide (LASIX) injection 40 mg  40 mg Intravenous Q12H Sharen Hones, MD   40 mg at 08/08/2019 0518  . gabapentin  (NEURONTIN) capsule 900 mg  900 mg Oral TID Sharen Hones, MD   900 mg at 08/22/2019 0950  . hydrALAZINE (APRESOLINE) tablet 50 mg  50 mg Oral TID Sharen Hones, MD   50 mg at 08/19/2019 0951  . insulin aspart (novoLOG) injection 0-5 Units  0-5 Units Subcutaneous QHS Sharen Hones, MD      . insulin aspart (novoLOG) injection 0-9 Units  0-9 Units Subcutaneous TID WC Sharen Hones, MD   3 Units at 08/08/2019 1332  . losartan (COZAAR) tablet 100 mg  100 mg Oral Daily Sharen Hones, MD   100 mg at 08/18/2019 0950  . pantoprazole (PROTONIX) EC tablet 40 mg  40 mg Oral Daily Sharen Hones, MD   40 mg at 08/27/2019 0950  . pramipexole (MIRAPEX) tablet 0.25 mg  0.25 mg Oral QHS PRN Sharen Hones, MD         Discharge Medications: Please see discharge summary for a list of discharge medications.  Relevant Imaging Results:  Relevant Lab Results:   Additional Information SSN:[id number]  Gerrianne Scale Porfirio Bollier, LCSW

## 2019-08-25 LAB — HEMOGLOBIN A1C
Hgb A1c MFr Bld: 5.7 % — ABNORMAL HIGH (ref 4.8–5.6)
Mean Plasma Glucose: 117 mg/dL

## 2019-09-07 NOTE — Death Summary Note (Addendum)
Patient is a 86 year old male with history of type 2 diabetes, prostate cancer, essential hypertension, obstructive sleep apnea who present to the hospital with a short of breath and cough he had a profound elevation of BNP, negative procalcitonin level.  Chest x-ray showed pulmonary edema.  He was started on IV Lasix for exacerbation of congestive heart failure.  Patient is treated for congestive heart failure exacerbation, he was giving IV Lasix.  He also had a sleep apnea, was not able to tolerate the CPAP.  Echocardiogram showed normal ejection fraction.  Patient is DO NOT RESUSCITATE status.  He died suddenly at 55 and 09/12/19.  Cause of death.:  Acute on chronic diastolic congestive heart failure Acute hypoxemic respiratory failure. Obstructive sleep apnea, Acute kidney injury   This note is dictated on 6/18/201. 7:30 AM.

## 2019-09-07 NOTE — Progress Notes (Addendum)
Attempted call to [name] at [phone].Ms. Joshua Humphrey is on file as emergency contact person for this pt.  Message left for Ms. Long to call [facility] at [phone] ASAP. No other information left on voicemail, as unable to confirm owner of voicemail.   Update: 0345: Was able to contact [name]; she is coming to the hospital.

## 2019-09-07 NOTE — ED Provider Notes (Signed)
[facility] Department of Emergency Medicine   Code Blue CONSULT NOTE  Chief Complaint: Cardiac arrest/unresponsive   Level V Caveat: Unresponsive  History of present illness: I was contacted by the hospital for a CODE BLUE cardiac arrest upstairs and presented to the patient's bedside.    ROS: Unable to obtain, Level V caveat  Scheduled Meds: . aspirin EC  81 mg Oral Daily  . atorvastatin  40 mg Oral Daily  . carvedilol  12.5 mg Oral BID WC  . enoxaparin (LOVENOX) injection  40 mg Subcutaneous Q24H  . Ferrous Fumarate  1 tablet Oral BID  . furosemide  40 mg Intravenous Once  . furosemide  40 mg Intravenous Q12H  . gabapentin  900 mg Oral TID  . hydrALAZINE  50 mg Oral TID  . insulin aspart  0-5 Units Subcutaneous QHS  . insulin aspart  0-9 Units Subcutaneous TID WC  . losartan  100 mg Oral Daily  . pantoprazole  40 mg Oral Daily   Continuous Infusions: PRN Meds:.acetaminophen, albuterol, pramipexole Past Medical History:  Diagnosis Date  . Anemia   . Asthma   . Cancer [facility])    prostate  . Cancer ([place])    skin   . Diabetes mellitus without complication ([place])   . GERD (gastroesophageal reflux disease)   . Hypertension   . PONV (postoperative nausea and vomiting)   . Respiratory infection 08/23/2017  . Septic olecranon bursitis of left elbow 01/22/2016  . Sleep apnea    Past Surgical History:  Procedure Laterality Date  . CATARACT EXTRACTION, BILATERAL    . ELBOW SURGERY    . EYE SURGERY    . lipoma removal  2000   back   . OLECRANON BURSECTOMY Left 01/21/2016   Procedure: OLECRANON BURSA;  Surgeon: Corky Mull, MD;  Location: [facility] ORS;  Service: Orthopedics;  Laterality: Left;  . PROSTATE SURGERY  2002  . TOE SURGERY    . TONSILLECTOMY    . tumor removed  2000   Social History   Socioeconomic History  . Marital status: Widowed    Spouse name: Not on file  . Number of children: Not on file  . Years of education: Not on file  .  Highest education level: Not on file  Occupational History  . Not on file  Tobacco Use  . Smoking status: Never Smoker  . Smokeless tobacco: Never Used  Substance and Sexual Activity  . Alcohol use: Yes    Alcohol/week: 0.0 standard drinks  . Drug use: No  . Sexual activity: Not Currently  Other Topics Concern  . Not on file  Social History Narrative  . Not on file   Social Determinants of Health   Financial Resource Strain:   . Difficulty of Paying Living Expenses:   Food Insecurity:   . Worried About Charity fundraiser in the Last Year:   . Arboriculturist in the Last Year:   Transportation Needs:   . Film/video editor (Medical):   Marland Kitchen Lack of Transportation (Non-Medical):   Physical Activity:   . Days of Exercise per Week:   . Minutes of Exercise per Session:   Stress:   . Feeling of Stress :   Social Connections:   . Frequency of Communication with Friends and Family:   . Frequency of Social Gatherings with Friends and Family:   . Attends Religious Services:   . Active Member of Clubs or Organizations:   . Attends  Club or Organization Meetings:   Marland Kitchen Marital Status:   Intimate Partner Violence:   . Fear of Current or Ex-Partner:   . Emotionally Abused:   Marland Kitchen Physically Abused:   . Sexually Abused:    No Known Allergies  Last set of Vital Signs (not current) Vitals:   08/28/2019 2008 08/09/2019 2008  BP: 111/66 111/66  Pulse: 75 72  Resp: 19 19  Temp: 98.9 F (37.2 C) 98.9 F (37.2 C)  SpO2: 93% 93%      Physical Exam   Gen: unresponsive Cardiovascular: pulseless  Resp: apneic. Abd: nondistended  Neuro: GCS 3, unresponsive to pain  HEENT: No gross abnormalities noted Neck: No crepitus  Musculoskeletal: No deformity  Skin: warm  Procedures (when applicable, including Critical Care time): Procedures   MDM / Assessment and Plan Upon arrival the patient is unreponsive, pulseless and apneic.  Multiple nurses and hospitalist NP at bedside.  I  confirmed with staff that the patient is DNR/DNI.  Cardiac monitor demonstrates asystole.  No additional intervention indicated on my part.    Hinda Kehr, MD 09-02-19 [phone]

## 2019-09-07 NOTE — Progress Notes (Signed)
Cross Cover Brief Note Patient went unresponsive wihile texch in room.  CPR briefly initiated as code status not indicated by bracelet. DNR status colnfirmed . Death confirmed at bedside patient without neuro response, spontaneous breathing or pyuls at 1244 09-18-19

## 2019-09-07 NOTE — Discharge Summary (Signed)
Patient is a 86 year old male with history of type 2 diabetes, prostate cancer, essential hypertension, obstructive sleep apnea who present to the hospital with a short of breath and coughhe had a profound elevation of BNP, negative procalcitonin level. Chest x-ray showed pulmonary edema. He was started on IV Lasix for exacerbation of congestive heart failure.  Patient is treated for congestive heart failure exacerbation, he was giving IV Lasix.  He also had a sleep apnea, was not able to tolerate the CPAP.  Echocardiogram showed normal ejection fraction.  Patient is DO NOT RESUSCITATE status.  He died suddenly at 71 and 09-23-2019.  Cause of death.:  Acute on chronic diastolic congestive heart failure Acute hypoxemic respiratory failure. Obstructive sleep apnea, Acute kidney injury   This note is dictated on 6/18/201. 7:30 AM.

## 2019-09-07 NOTE — Progress Notes (Signed)
   09/13/19 0000  Clinical Encounter Type  Visited With Health care provider;Other (Comment)  Visit Type Initial;Code  Referral From Nurse  Consult/Referral To Chaplain  Chaplain received code blue. Chaplain arrived made pastoral presence known, patient died no family.

## 2019-09-07 NOTE — Progress Notes (Signed)
   09/12/19 0400  Clinical Encounter Type  Visit Type Follow-up;Death  Referral From Nurse  Consult/Referral To Chaplain  Spiritual Encounters  Spiritual Needs Grief support;Other (Comment)  Chaplain received page family was arriving. Chaplain made pastoral presence known and family had not arrived yet. Chaplain asks to be page again when family arrives.

## 2019-09-07 NOTE — Progress Notes (Signed)
   01-Sep-2019 0500  Clinical Encounter Type  Visited With Family  Visit Type Follow-up;Death  Referral From Nurse  Consult/Referral To Chaplain  Spiritual Encounters  Spiritual Needs Emotional;Grief support;Other (Comment)  Chaplain received page family had arrived. Chaplain pastoral presence was known. Chaplain gave her condolences and complete release form with daughter Angela Nevin.

## 2019-09-07 DEATH — deceased

## 2019-09-18 ENCOUNTER — Ambulatory Visit: Payer: Medicare HMO | Admitting: Family

## 2020-01-25 ENCOUNTER — Ambulatory Visit: Payer: Medicare HMO | Admitting: Pain Medicine

## 2020-05-16 ENCOUNTER — Other Ambulatory Visit: Payer: Self-pay

## 2020-05-20 ENCOUNTER — Ambulatory Visit: Payer: Self-pay | Admitting: Urology

## 2020-05-23 ENCOUNTER — Other Ambulatory Visit: Payer: Self-pay
